# Patient Record
Sex: Female | Born: 1950 | Race: White | Hispanic: No | Marital: Married | Smoking: Former smoker
Health system: Southern US, Community
[De-identification: ages and names within clinical notes are randomized; demographics above are authoritative.]

## PROBLEM LIST (undated history)

## (undated) DIAGNOSIS — Z972 Presence of dental prosthetic device (complete) (partial): Secondary | ICD-10-CM

## (undated) DIAGNOSIS — K219 Gastro-esophageal reflux disease without esophagitis: Secondary | ICD-10-CM

## (undated) DIAGNOSIS — M1990 Unspecified osteoarthritis, unspecified site: Secondary | ICD-10-CM

## (undated) DIAGNOSIS — F329 Major depressive disorder, single episode, unspecified: Secondary | ICD-10-CM

## (undated) DIAGNOSIS — F419 Anxiety disorder, unspecified: Secondary | ICD-10-CM

## (undated) DIAGNOSIS — I959 Hypotension, unspecified: Secondary | ICD-10-CM

## (undated) DIAGNOSIS — E860 Dehydration: Secondary | ICD-10-CM

## (undated) DIAGNOSIS — R6510 Systemic inflammatory response syndrome (SIRS) of non-infectious origin without acute organ dysfunction: Secondary | ICD-10-CM

## (undated) DIAGNOSIS — R7303 Prediabetes: Secondary | ICD-10-CM

## (undated) DIAGNOSIS — I10 Essential (primary) hypertension: Secondary | ICD-10-CM

## (undated) DIAGNOSIS — F32A Depression, unspecified: Secondary | ICD-10-CM

## (undated) HISTORY — PX: ABDOMINAL HYSTERECTOMY: SHX81

## (undated) HISTORY — PX: COLONOSCOPY: SHX174

---

## 2004-03-08 ENCOUNTER — Ambulatory Visit: Payer: Self-pay | Admitting: Unknown Physician Specialty

## 2004-11-22 ENCOUNTER — Ambulatory Visit: Payer: Self-pay | Admitting: Internal Medicine

## 2005-11-23 ENCOUNTER — Ambulatory Visit: Payer: Self-pay | Admitting: Internal Medicine

## 2006-07-31 ENCOUNTER — Ambulatory Visit: Payer: Self-pay | Admitting: Physician Assistant

## 2006-12-11 ENCOUNTER — Ambulatory Visit (HOSPITAL_COMMUNITY): Admission: RE | Admit: 2006-12-11 | Discharge: 2006-12-12 | Payer: Self-pay | Admitting: Neurosurgery

## 2007-03-01 ENCOUNTER — Ambulatory Visit: Payer: Self-pay | Admitting: Internal Medicine

## 2007-04-19 HISTORY — PX: CERVICAL SPINE SURGERY: SHX589

## 2007-04-19 HISTORY — PX: BACK SURGERY: SHX140

## 2007-04-19 HISTORY — PX: NECK SURGERY: SHX720

## 2007-05-14 ENCOUNTER — Ambulatory Visit: Payer: Self-pay | Admitting: Neurosurgery

## 2007-06-15 ENCOUNTER — Ambulatory Visit (HOSPITAL_COMMUNITY): Admission: RE | Admit: 2007-06-15 | Discharge: 2007-06-16 | Payer: Self-pay | Admitting: Neurosurgery

## 2007-11-26 ENCOUNTER — Other Ambulatory Visit: Payer: Self-pay

## 2007-11-26 ENCOUNTER — Inpatient Hospital Stay: Payer: Self-pay | Admitting: Unknown Physician Specialty

## 2007-12-07 ENCOUNTER — Ambulatory Visit: Payer: Self-pay | Admitting: Unknown Physician Specialty

## 2007-12-18 ENCOUNTER — Ambulatory Visit: Payer: Self-pay | Admitting: Unknown Physician Specialty

## 2008-01-17 ENCOUNTER — Ambulatory Visit: Payer: Self-pay | Admitting: Unknown Physician Specialty

## 2008-02-17 ENCOUNTER — Ambulatory Visit: Payer: Self-pay | Admitting: Unknown Physician Specialty

## 2008-03-18 ENCOUNTER — Ambulatory Visit: Payer: Self-pay | Admitting: Unknown Physician Specialty

## 2008-04-18 ENCOUNTER — Ambulatory Visit: Payer: Self-pay | Admitting: Unknown Physician Specialty

## 2008-05-19 ENCOUNTER — Ambulatory Visit: Payer: Self-pay | Admitting: Unknown Physician Specialty

## 2008-06-24 ENCOUNTER — Ambulatory Visit: Payer: Self-pay | Admitting: Unknown Physician Specialty

## 2008-07-09 ENCOUNTER — Ambulatory Visit: Payer: Self-pay | Admitting: Internal Medicine

## 2008-07-17 ENCOUNTER — Ambulatory Visit: Payer: Self-pay | Admitting: Unknown Physician Specialty

## 2008-08-16 ENCOUNTER — Ambulatory Visit: Payer: Self-pay | Admitting: Unknown Physician Specialty

## 2008-10-16 ENCOUNTER — Ambulatory Visit: Payer: Self-pay | Admitting: Unknown Physician Specialty

## 2008-12-17 ENCOUNTER — Ambulatory Visit: Payer: Self-pay | Admitting: Unknown Physician Specialty

## 2009-01-16 ENCOUNTER — Ambulatory Visit: Payer: Self-pay | Admitting: Unknown Physician Specialty

## 2009-02-16 ENCOUNTER — Ambulatory Visit: Payer: Self-pay | Admitting: Unknown Physician Specialty

## 2009-03-18 ENCOUNTER — Ambulatory Visit: Payer: Self-pay | Admitting: Unknown Physician Specialty

## 2009-04-20 ENCOUNTER — Ambulatory Visit: Payer: Self-pay | Admitting: Unknown Physician Specialty

## 2009-05-19 ENCOUNTER — Ambulatory Visit: Payer: Self-pay | Admitting: Unknown Physician Specialty

## 2009-06-16 ENCOUNTER — Ambulatory Visit: Payer: Self-pay | Admitting: Unknown Physician Specialty

## 2009-07-17 ENCOUNTER — Ambulatory Visit: Payer: Self-pay | Admitting: Unknown Physician Specialty

## 2009-08-18 ENCOUNTER — Ambulatory Visit: Payer: Self-pay | Admitting: Unknown Physician Specialty

## 2009-09-16 ENCOUNTER — Ambulatory Visit: Payer: Self-pay | Admitting: Unknown Physician Specialty

## 2009-09-29 ENCOUNTER — Ambulatory Visit: Payer: Self-pay | Admitting: Internal Medicine

## 2009-10-16 ENCOUNTER — Ambulatory Visit: Payer: Self-pay | Admitting: Unknown Physician Specialty

## 2009-11-16 ENCOUNTER — Ambulatory Visit: Payer: Self-pay | Admitting: Unknown Physician Specialty

## 2009-12-17 ENCOUNTER — Ambulatory Visit: Payer: Self-pay | Admitting: Unknown Physician Specialty

## 2010-01-18 ENCOUNTER — Ambulatory Visit: Payer: Self-pay | Admitting: Unknown Physician Specialty

## 2010-02-16 ENCOUNTER — Ambulatory Visit: Payer: Self-pay | Admitting: Unknown Physician Specialty

## 2010-03-18 ENCOUNTER — Ambulatory Visit: Payer: Self-pay | Admitting: Unknown Physician Specialty

## 2010-04-19 ENCOUNTER — Ambulatory Visit: Payer: Self-pay | Admitting: Unknown Physician Specialty

## 2010-05-19 ENCOUNTER — Ambulatory Visit: Payer: Self-pay | Admitting: Unknown Physician Specialty

## 2010-06-17 ENCOUNTER — Ambulatory Visit: Payer: Self-pay | Admitting: Unknown Physician Specialty

## 2010-08-13 ENCOUNTER — Ambulatory Visit: Payer: Self-pay | Admitting: Obstetrics and Gynecology

## 2010-08-17 ENCOUNTER — Ambulatory Visit: Payer: Self-pay | Admitting: Unknown Physician Specialty

## 2010-08-31 NOTE — Op Note (Signed)
[name], [name]              ACCOUNT NO.:  [id number]   MEDICAL RECORD NO.:  [id number]          PATIENT TYPE:  INP   LOCATION:  2899                         FACILITY:  [facility]   PHYSICIAN:  Donalee Citrin, M.D.        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   SURGEON:  Donalee Citrin, M.D.   ASSISTANT:  Reinaldo Meeker, M.D.   ANESTHESIA:  General endotracheal.   The patient is 60 year old female who has longstanding progressively  worsening neck pain, bilateral shoulder and arm pain with numbness and  tingling of the fingers and weakness in the triceps.  MRI scan showed  large amount of spinal stenosis due to spondylosis and ruptured disk at  C4-5, C5-6, C6-7 causing frank spinal cord compression.  Due to the  patient's clinical exam, MRI findings, and failure of conservative  treatment, the patient is recommended anterior cervical diskectomy and  fusion.  Risks and benefits of the operation explained to the patient.  She understood and agreed to proceed.   OPERATIVE REPORT:  The patient was brought to the OR, was induced under  general anesthesia, positioned supine, neck slight extension 5 pounds of  halter traction.  The right side of the neck was prepped and draped in  usual sterile fashion.  Preoperative x-ray localized the C5-6 disk  space.  A curvilinear incision was made just off the midline to the  anterior portion of the  sternocleidomastoid, the superficial platysma  was then dissected out and divided longitudinally.  The avascular plane  between the  sternocleidomastoid and strap muscles was developed down  through the fascia.  Prevertebral fascia was then dissected with  Kittner's.  Intraoperative x-ray confirmed visualization of the C4-5  disk space.  Annulotomy was made with a 15 blade with a scalpel to mark  the disk space and then the longus colli was reflected laterally at all  three levels.  Self-retaining retractors were then placed, exposing C5-6  and C6-7 initially.  There was a marked hyperlordosis of these levels,  a large amount of anterior osteophytes, from the C5 vertebral body to  the C5-6 disk space.  These were bitten away with a 2 and 3 Kerrison  punch as well as the C6-7.  Both disk spaces were drilled down the  posterior annulus and posterior osteophytic complexes.  Then, using the  Kerrison punch first working at C6-7, the undersurface of posterior  annulus, posterior longitudinal ligament removed in piecemeal fashion.  There was a very large osteophyte coming off the C6 vertebral body  displacing the thecal sac spinal cord just to the right of midline.  This was all under bitten very carefully and  aggressively removed, and  this completely decompressed the central canal.  Both endplates were  under bitten, marching across laterally to the level of the C7 pedicle,  and both C7 nerve roots were palpated and decompressed out the foramen  flush to the pedicle.  Then, the Gelfoam was placed after the endplates  were scraped and squared off.  Then, working at C5-6, interspace was  drilled down the posterior annulus, posterior osteophytic complex.  Again very large osteophytes coming off the C5 vertebral body  predominantly but also C6, displacing thecal sac just right of midline.  These were all removed, decompressing the central canal.  Both C6  pedicles were identified.  Both C6 nerve root and neural foramina were  decompressed flush to the pedicle.  Then, both interspaces were  copiously irrigated, and 6-mm  allograft was inserted at C5-6 and 7 mm at  C6-7.  Self-retaining retractor was then repositioned exposing C4-5.  There are large anterior calcified disk coming off the anterior margin  of C4-5.  This was all removed with Leksell rongeur.  Annulotomy was  extended.  Disk space was drilled down.  Very large osteophyte, C4  vertebral body, was under bitten with a 1-mm Kerrison punch  decompressing the central canal.  The posterior longitudinal ligament  was removed in piecemeal fashion, decompressing the central canal as  well.  Aggressive under biting of both the C4-C5 vertebral bodies, flush  with the C4 pedicle and both C5 neural foramina were widely patent at  the end of the foraminotomy and diskectomy.  Then, a 5-mm allograft was  inserted.  A 57-mm Venture plate was then sized and inserted, confirmed  by fluoroscopy, and all eight screws had excellent purchase.  Locking  mechanism engaged.  Wound was then copiously irrigated.  Meticulous  hemostasis was maintained.  Drain was placed, and the wound was closed  in layers with interrupted Vicryl in the platysma and a running 4-0  subcuticular.  Benzoin and Steri-Strips applied. The patient went to the  recovery room in stable condition.  At the end of the case, instrument  and sponge counts.           ______________________________  Donalee Citrin, M.D.     GC/MEDQ  D:  06/15/2007  T:  06/16/2007  Job:  [id number]

## 2010-08-31 NOTE — Op Note (Signed)
NAMEGLENDY, BARSANTI              ACCOUNT NO.:  [id number]   MEDICAL RECORD NO.:  [id number]          PATIENT TYPE:  OIB   LOCATION:  3172                         FACILITY:  [facility]   PHYSICIAN:  Donalee Citrin, M.D.        DATE OF BIRTH:  [birth year]   DATE OF PROCEDURE:  12/11/2006  DATE OF DISCHARGE:                               OPERATIVE REPORT   PREOPERATIVE DIAGNOSIS:  Left sided L5 radiculopathy from ruptured disk  to L4-5 left.   PROCEDURE:  Lumbar laminectomy microdiskectomy at L4-5 left with  microscopic dissection of left L5 nerve root and microscopic diskectomy.   SURGEON:  Donalee Citrin, M.D.   ASSISTANT:  Kathaleen Maser. Pool, M.D.   ANESTHESIA:  General endotracheal.   HISTORY OF PRESENT ILLNESS:  The patient is a very pleasant 60 year old  female who has had back and left leg pain radiating down the posterior  aspect of her left thigh, left calf, across the top of the foot and big  toe and it has been refractory to all forms of conservative treatment  for the last several weeks and months.  The patient's MRI showed large  ruptured disk at L4-5 compressing the left L5 nerve root.  Due to the  patient's failure of conservative treatment and physical exam, I  recommended laminectomy and microdiskectomy.  Risks and benefits of the  operation were explained to the patient.  The patient understood and  agreed to proceed forward.   The patient was brought to the OR and was induced under general  anesthesia.  She was placed prone in the [place].  The back is prepped  and draped in the usual fashion.  After I localized the L4-5 disk space,  a midline incision was made after infiltration of 10 mL of lidocaine  with epinephrine and Bovie electrocautery was used to take down the  subcutaneous tissues.  Subperiosteal dissection was carried out and  lamina was __________  L4-5.  However, intraoperative x-ray confirmed  the position to be to the 3-4 level, so attention was taken 1 end space  below this and in the inferior aspect, the __________  medial facet  complex __________  was drilled down and removed in piecemeal fashion  with the 2 and 3 mm Kerrison punch.  At this point, the ligamentum  flavum was visualized.  This was also removed in piecemeal fashion  exposing the thecal sac.  At this point, the operating room microscope  was draped and brought into the field for microscopic illumination.  The  L5 pedicle was identified.  The remainder of the lateral gutter and  medial facet complex was underbitten with a 2 mm Kerrison punch exposing  the lateral aspect of the L5 nerve root.  This is a remarkably displaced  by a large disk herniation still contained with the ligament.  So a 4  __________  was used to dissect the fiber off of the disk herniation.  It was reflected medial with the D'Errico nerve root retractor and then  annulotomy using the 11 blade scalpel.  After the annulotomy, several  fragments of  disk were immediately expressed and removed.  Then the disk  space was cleaned out radically with __________  and Epstein curettes.  At the end of the diskectomy, there was no further stenosis and the  foramen was explored with a hockey stick, coronary dilator as well as  the medial aspect and lateral aspect of the interspace.  Once adequate  decompression had been confirmed, the wound was copiously irrigated and  meticulous hemostasis was maintained.  Gelfoam was overlaid on top of the dura.  Muscle and fascia were  approximated in layers with interrupted Vicryl and the skin was closed  with a running 4-0 subcuticular and benzoin and Steri-Strips applied.  The patient went to the recovery room in stable condition.  At the end  of the case, needle counts and sponge counts were correct.           ______________________________  Donalee Citrin, M.D.     GC/MEDQ  D:  12/11/2006  T:  12/11/2006  Job:  [id number]

## 2010-09-22 ENCOUNTER — Ambulatory Visit: Payer: Self-pay | Admitting: Obstetrics and Gynecology

## 2010-09-30 ENCOUNTER — Ambulatory Visit: Payer: Self-pay | Admitting: Obstetrics and Gynecology

## 2010-11-04 ENCOUNTER — Ambulatory Visit: Payer: Self-pay | Admitting: Internal Medicine

## 2010-11-09 ENCOUNTER — Ambulatory Visit: Payer: Self-pay | Admitting: Unknown Physician Specialty

## 2010-11-16 ENCOUNTER — Other Ambulatory Visit: Payer: Self-pay | Admitting: Unknown Physician Specialty

## 2010-11-17 ENCOUNTER — Ambulatory Visit: Payer: Self-pay | Admitting: Unknown Physician Specialty

## 2010-11-22 ENCOUNTER — Ambulatory Visit: Payer: Self-pay | Admitting: Obstetrics and Gynecology

## 2010-12-18 ENCOUNTER — Ambulatory Visit: Payer: Self-pay | Admitting: Unknown Physician Specialty

## 2010-12-27 ENCOUNTER — Ambulatory Visit: Payer: Self-pay | Admitting: Unknown Physician Specialty

## 2011-01-07 LAB — BASIC METABOLIC PANEL
BUN: 17
CO2: 30
Calcium: 9.5
Chloride: 103
Creatinine, Ser: 0.8
GFR calc Af Amer: >60

## 2011-01-07 LAB — CBC
MCHC: 34.2
MCV: 90.4
Platelets: 313
RBC: 3.96
WBC: 7.7

## 2011-01-28 LAB — BASIC METABOLIC PANEL
CO2: 29
Calcium: 10
Creatinine, Ser: 0.78
GFR calc Af Amer: >60
Sodium: 138

## 2011-01-28 LAB — CBC
Hemoglobin: 13.6
RBC: 4.4
WBC: 9.4

## 2011-11-07 ENCOUNTER — Ambulatory Visit: Payer: Self-pay | Admitting: Internal Medicine

## 2012-11-07 ENCOUNTER — Ambulatory Visit: Payer: Self-pay | Admitting: Internal Medicine

## 2013-09-25 DIAGNOSIS — I10 Essential (primary) hypertension: Secondary | ICD-10-CM | POA: Insufficient documentation

## 2013-11-11 ENCOUNTER — Ambulatory Visit: Payer: Self-pay | Admitting: Internal Medicine

## 2015-02-23 ENCOUNTER — Other Ambulatory Visit: Payer: Self-pay | Admitting: Internal Medicine

## 2015-02-23 DIAGNOSIS — Z1231 Encounter for screening mammogram for malignant neoplasm of breast: Secondary | ICD-10-CM

## 2015-03-05 ENCOUNTER — Ambulatory Visit
Admission: RE | Admit: 2015-03-05 | Discharge: 2015-03-05 | Disposition: A | Discharge status: Home / Self Care | Payer: Medicare Other | Source: Ambulatory Visit | Attending: Internal Medicine | Admitting: Internal Medicine

## 2015-03-05 ENCOUNTER — Other Ambulatory Visit: Payer: Self-pay | Admitting: Internal Medicine

## 2015-03-05 DIAGNOSIS — Z1231 Encounter for screening mammogram for malignant neoplasm of breast: Secondary | ICD-10-CM

## 2016-02-02 ENCOUNTER — Other Ambulatory Visit: Payer: Self-pay | Admitting: Internal Medicine

## 2016-02-02 DIAGNOSIS — Z1231 Encounter for screening mammogram for malignant neoplasm of breast: Secondary | ICD-10-CM

## 2016-03-07 ENCOUNTER — Ambulatory Visit
Admission: RE | Admit: 2016-03-07 | Discharge: 2016-03-07 | Disposition: A | Discharge status: Home / Self Care | Payer: Medicare Other | Source: Ambulatory Visit | Attending: Internal Medicine | Admitting: Internal Medicine

## 2016-03-07 DIAGNOSIS — Z1231 Encounter for screening mammogram for malignant neoplasm of breast: Secondary | ICD-10-CM | POA: Diagnosis present

## 2016-03-17 NOTE — Discharge Instructions (Signed)
Cataract Surgery, Care After °Refer to this sheet in the next few weeks. These instructions provide you with information about caring for yourself after your procedure. Your health care provider may also give you more specific instructions. Your treatment has been planned according to current medical practices, but problems sometimes occur. Call your health care provider if you have any problems or questions after your procedure. °What can I expect after the procedure? °After the procedure, it is common to have: °· Itching. °· Discomfort. °· Fluid discharge. °· Sensitivity to light and to touch. °· Bruising. °Follow these instructions at home: °Eye Care  °· Check your eye every day for signs of infection. Watch for: °¨ Redness, swelling, or pain. °¨ Fluid, blood, or pus. °¨ Warmth. °¨ Bad smell. °Activity  °· Avoid strenuous activities, such as playing contact sports, for as long as told by your health care provider. °· Do not drive or operate heavy machinery until your health care provider approves. °· Do not bend or lift heavy objects . Bending increases pressure in the eye. You can walk, climb stairs, and do light household chores. °· Ask your health care provider when you can return to work. If you work in a dusty environment, you may be advised to wear protective eyewear for a period of time. °General instructions  °· Take or apply over-the-counter and prescription medicines only as told by your health care provider. This includes eye drops. °· Do not touch or rub your eyes. °· If you were given a protective shield, wear it as told by your health care provider. If you were not given a protective shield, wear sunglasses as told by your health care provider to protect your eyes. °· Keep the area around your eye clean and dry. Avoid swimming or allowing water to hit you directly in the face while showering until told by your health care provider. Keep soap and shampoo out of your eyes. °· Do not put a contact lens  into the affected eye or eyes until your health care provider approves. °· Keep all follow-up visits as told by your health care provider. This is important. °Contact a health care provider if: ° °· You have increased bruising around your eye. °· You have pain that is not helped with medicine. °· You have a fever. °· You have redness, swelling, or pain in your eye. °· You have fluid, blood, or pus coming from your incision. °· Your vision gets worse. °Get help right away if: °· You have sudden vision loss. °This information is not intended to replace advice given to you by your health care provider. Make sure you discuss any questions you have with your health care provider. °Document Released: 10/22/2004 Document Revised: 08/13/2015 Document Reviewed: 02/12/2015 °Elsevier Interactive Patient Education © 2017 [employer] ° ° ° ° °General Anesthesia, Adult, Care After °These instructions provide you with information about caring for yourself after your procedure. Your health care provider may also give you more specific instructions. Your treatment has been planned according to current medical practices, but problems sometimes occur. Call your health care provider if you have any problems or questions after your procedure. °What can I expect after the procedure? °After the procedure, it is common to have: °· Vomiting. °· A sore throat. °· Mental slowness. °It is common to feel: °· Nauseous. °· Cold or shivery. °· Sleepy. °· Tired. °· Sore or achy, even in parts of your body where you did not have surgery. °Follow these instructions at   home: °For at least 24 hours after the procedure:  °· Do not: °¨ Participate in activities where you could fall or become injured. °¨ Drive. °¨ Use heavy machinery. °¨ Drink alcohol. °¨ Take sleeping pills or medicines that cause drowsiness. °¨ Make important decisions or sign legal documents. °¨ Take care of children on your own. °· Rest. °Eating and drinking  °· If you vomit, drink  water, juice, or soup when you can drink without vomiting. °· Drink enough fluid to keep your urine clear or pale yellow. °· Make sure you have little or no nausea before eating solid foods. °· Follow the diet recommended by your health care provider. °General instructions  °· Have a responsible adult stay with you until you are awake and alert. °· Return to your normal activities as told by your health care provider. Ask your health care provider what activities are safe for you. °· Take over-the-counter and prescription medicines only as told by your health care provider. °· If you smoke, do not smoke without supervision. °· Keep all follow-up visits as told by your health care provider. This is important. °Contact a health care provider if: °· You continue to have nausea or vomiting at home, and medicines are not helpful. °· You cannot drink fluids or start eating again. °· You cannot urinate after 8-12 hours. °· You develop a skin rash. °· You have fever. °· You have increasing redness at the site of your procedure. °Get help right away if: °· You have difficulty breathing. °· You have chest pain. °· You have unexpected bleeding. °· You feel that you are having a life-threatening or urgent problem. °This information is not intended to replace advice given to you by your health care provider. Make sure you discuss any questions you have with your health care provider. °Document Released: 07/11/2000 Document Revised: 09/07/2015 Document Reviewed: 03/19/2015 °Elsevier Interactive Patient Education © 2017 [employer] ° °

## 2016-03-21 ENCOUNTER — Ambulatory Visit: Payer: Medicare Other | Admitting: Anesthesiology

## 2016-03-21 ENCOUNTER — Ambulatory Visit
Admission: RE | Admit: 2016-03-21 | Discharge: 2016-03-21 | Disposition: A | Discharge status: Home / Self Care | Payer: Medicare Other | Source: Ambulatory Visit | Attending: Ophthalmology | Admitting: Ophthalmology

## 2016-03-21 ENCOUNTER — Encounter
Admission: RE | Disposition: A | Discharge status: Home / Self Care | Payer: Self-pay | Source: Ambulatory Visit | Attending: Ophthalmology

## 2016-03-21 DIAGNOSIS — I10 Essential (primary) hypertension: Secondary | ICD-10-CM | POA: Diagnosis not present

## 2016-03-21 DIAGNOSIS — H2511 Age-related nuclear cataract, right eye: Secondary | ICD-10-CM | POA: Insufficient documentation

## 2016-03-21 DIAGNOSIS — Z87891 Personal history of nicotine dependence: Secondary | ICD-10-CM | POA: Diagnosis not present

## 2016-03-21 HISTORY — DX: Presence of dental prosthetic device (complete) (partial): Z97.2

## 2016-03-21 HISTORY — DX: Unspecified osteoarthritis, unspecified site: M19.90

## 2016-03-21 HISTORY — DX: Depression, unspecified: F32.A

## 2016-03-21 HISTORY — PX: CATARACT EXTRACTION W/PHACO: SHX586

## 2016-03-21 HISTORY — DX: Major depressive disorder, single episode, unspecified: F32.9

## 2016-03-21 HISTORY — DX: Essential (primary) hypertension: I10

## 2016-03-21 SURGERY — PHACOEMULSIFICATION, CATARACT, WITH IOL INSERTION
Anesthesia: Monitor Anesthesia Care | Site: Eye | Laterality: Right | Wound class: Clean

## 2016-03-21 MED ORDER — ARMC OPHTHALMIC DILATING DROPS
1.0000 "application " | OPHTHALMIC | Status: DC | PRN
  Administered 2016-03-21 (×3): 1 via OPHTHALMIC

## 2016-03-21 MED ORDER — FENTANYL CITRATE (PF) 100 MCG/2ML IJ SOLN
INTRAMUSCULAR | Status: DC | PRN
  Administered 2016-03-21: 50 ug via INTRAVENOUS

## 2016-03-21 MED ORDER — LACTATED RINGERS IV SOLN: INTRAVENOUS | Status: DC

## 2016-03-21 MED ORDER — CEFUROXIME OPHTHALMIC INJECTION 1 MG/0.1 ML
INJECTION | OPHTHALMIC | Status: DC | PRN
  Administered 2016-03-21: 0.1 mL via OPHTHALMIC

## 2016-03-21 MED ORDER — MOXIFLOXACIN HCL 0.5 % OP SOLN
1.0000 [drp] | OPHTHALMIC | Status: DC | PRN
  Administered 2016-03-21 (×3): 1 [drp] via OPHTHALMIC

## 2016-03-21 MED ORDER — EPINEPHRINE PF 1 MG/ML IJ SOLN
INTRAMUSCULAR | Status: DC | PRN
  Administered 2016-03-21: 104 mL via OPHTHALMIC

## 2016-03-21 MED ORDER — ACETAMINOPHEN 160 MG/5ML PO SOLN: 325.0000 mg | ORAL | Status: DC | PRN

## 2016-03-21 MED ORDER — NA HYALUR & NA CHOND-NA HYALUR 0.4-0.35 ML IO KIT
PACK | INTRAOCULAR | Status: DC | PRN
  Administered 2016-03-21: 1 mL via INTRAOCULAR

## 2016-03-21 MED ORDER — MIDAZOLAM HCL 2 MG/2ML IJ SOLN
INTRAMUSCULAR | Status: DC | PRN
  Administered 2016-03-21: 2 mg via INTRAVENOUS

## 2016-03-21 MED ORDER — BRIMONIDINE TARTRATE 0.2 % OP SOLN
OPHTHALMIC | Status: DC | PRN
  Administered 2016-03-21: 1 [drp] via OPHTHALMIC

## 2016-03-21 MED ORDER — TIMOLOL MALEATE 0.5 % OP SOLN
OPHTHALMIC | Status: DC | PRN
Start: 2016-03-21 — End: 2016-03-21
  Administered 2016-03-21: 1 [drp] via OPHTHALMIC

## 2016-03-21 MED ORDER — LIDOCAINE HCL (PF) 4 % IJ SOLN
INTRAOCULAR | Status: DC | PRN
  Administered 2016-03-21: .5 mL via OPHTHALMIC

## 2016-03-21 MED ORDER — ACETAMINOPHEN 325 MG PO TABS: 325.0000 mg | ORAL_TABLET | ORAL | Status: DC | PRN

## 2016-03-21 SURGICAL SUPPLY — 29 items
APPLICATOR COTTON TIP 3IN (MISCELLANEOUS) ×2 IMPLANT
CANNULA ANT/CHMB 27GA (MISCELLANEOUS) ×2 IMPLANT
DISSECTOR HYDRO NUCLEUS 50X22 (MISCELLANEOUS) ×2 IMPLANT
GLOVE BIO SURGEON STRL SZ7 (GLOVE) ×2 IMPLANT
GLOVE SURG LX 6.5 MICRO (GLOVE) ×1
GLOVE SURG LX STRL 6.5 MICRO (GLOVE) ×1 IMPLANT
GOWN STRL REUS W/ TWL LRG LVL3 (GOWN DISPOSABLE) ×2 IMPLANT
GOWN STRL REUS W/TWL LRG LVL3 (GOWN DISPOSABLE) ×2
LENS IOL ACRSF IQ ULTRA 17.5 (Intraocular Lens) ×1 IMPLANT
LENS IOL ACRYSOF IQ 17.5 (Intraocular Lens) ×2 IMPLANT
MARKER SKIN DUAL TIP RULER LAB (MISCELLANEOUS) ×2 IMPLANT
NEEDLE FILTER BLUNT 18X 1/2SAF (NEEDLE) ×1
NEEDLE FILTER BLUNT 18X1 1/2 (NEEDLE) ×1 IMPLANT
PACK CATARACT BRASINGTON (MISCELLANEOUS) ×2 IMPLANT
PACK EYE AFTER SURG (MISCELLANEOUS) ×2 IMPLANT
PACK OPTHALMIC (MISCELLANEOUS) ×2 IMPLANT
RING MALYGIN 7.0 (MISCELLANEOUS) IMPLANT
SOL BAL SALT 15ML (MISCELLANEOUS)
SOLUTION BAL SALT 15ML (MISCELLANEOUS) IMPLANT
SUT ETHILON 10-0 CS-B-6CS-B-6 (SUTURE)
SUT VICRYL  9 0 (SUTURE)
SUT VICRYL 9 0 (SUTURE) IMPLANT
SUTURE EHLN 10-0 CS-B-6CS-B-6 (SUTURE) IMPLANT
SYR 3ML LL SCALE MARK (SYRINGE) ×2 IMPLANT
SYR TB 1ML LUER SLIP (SYRINGE) ×2 IMPLANT
WATER STERILE IRR 250ML POUR (IV SOLUTION) ×2 IMPLANT
WATER STERILE IRR 500ML POUR (IV SOLUTION) IMPLANT
WICK EYE OCUCEL (MISCELLANEOUS) IMPLANT
WIPE NON LINTING 3.25X3.25 (MISCELLANEOUS) ×2 IMPLANT

## 2016-03-21 NOTE — Transfer of Care (Signed)
Immediate Anesthesia Transfer of Care Note  Patient: Carmen [name]  Procedure(s) Performed: Procedure(s) with comments: CATARACT EXTRACTION PHACO AND INTRAOCULAR LENS PLACEMENT (IOC) (Right) - RIGHT  Patient Location: PACU  Anesthesia Type: MAC  Level of Consciousness: awake, alert  and patient cooperative  Airway and Oxygen Therapy: Patient Spontanous Breathing and Patient connected to supplemental oxygen  Post-op Assessment: Post-op Vital signs reviewed, Patient's Cardiovascular Status Stable, Respiratory Function Stable, Patent Airway and No signs of Nausea or vomiting  Post-op Vital Signs: Reviewed and stable  Complications: No apparent anesthesia complications

## 2016-03-21 NOTE — H&P (Signed)
H+P reviewed and is up to date, please see paper chart.  

## 2016-03-21 NOTE — Anesthesia Postprocedure Evaluation (Signed)
Anesthesia Post Note  Patient: Carmen [name]  Procedure(s) Performed: Procedure(s) (LRB): CATARACT EXTRACTION PHACO AND INTRAOCULAR LENS PLACEMENT (IOC) (Right)  Patient location during evaluation: PACU Anesthesia Type: MAC Level of consciousness: awake and alert and oriented Pain management: satisfactory to patient Vital Signs Assessment: post-procedure vital signs reviewed and stable Respiratory status: spontaneous breathing, nonlabored ventilation and respiratory function stable Cardiovascular status: blood pressure returned to baseline and stable Postop Assessment: Adequate PO intake and No signs of nausea or vomiting Anesthetic complications: no    Raliegh Ip

## 2016-03-21 NOTE — Anesthesia Procedure Notes (Signed)
Procedure Name: MAC Performed by: Jailyn Langhorst Pre-anesthesia Checklist: Patient identified, Emergency Drugs available, Suction available, Timeout performed and Patient being monitored Patient Re-evaluated:Patient Re-evaluated prior to inductionOxygen Delivery Method: Nasal cannula Placement Confirmation: positive ETCO2     

## 2016-03-21 NOTE — Op Note (Signed)
Date of Surgery: 03/21/2016  PREOPERATIVE DIAGNOSES: Visually significant nuclear sclerotic cataract, right eye.  POSTOPERATIVE DIAGNOSES: Same  PROCEDURES PERFORMED: Cataract extraction with intraocular lens implant, right eye.  SURGEON: Almon Hercules, M.D.  ANESTHESIA: MAC and topical  IMPLANTS: [id number] +17.5 D  Implant Name Type Inv. Item Serial No. Manufacturer Lot No. LRB No. Used  LENS IOL ACRYSOF IQ 17.5 - [id number] Intraocular Lens LENS IOL ACRYSOF IQ 17.5 [id number] ALCON   Right 1     COMPLICATIONS: None.  DESCRIPTION OF PROCEDURE: Therapeutic options were discussed with the patient preoperatively, including a discussion of risks and benefits of surgery. Informed consent was obtained. An IOL-Master and immersion biometry were used to take the lens measurements, and a dilated fundus exam was performed within 6 months of the surgical date.  The patient was premedicated and brought to the operating room and placed on the operating table in the supine position. After adequate anesthesia, the patient was prepped and draped in the usual sterile ophthalmic fashion. A wire lid speculum was inserted and the microscope was positioned. A Superblade was used to create a paracentesis site at the limbus and a small amount of dilute preservative free lidocaine was instilled into the anterior chamber, followed by dispersive viscoelastic. A clear corneal incision was created temporally using a 2.4 mm keratome blade. Capsulorrhexis was then performed. In situ phacoemulsification was performed.  Cortical material was removed with the irrigation-aspiration unit. Dispersive viscoelastic was instilled to open the capsular bag. A posterior chamber intraocular lens with the specifications above was inserted and positioned. Irrigation-aspiration was used to remove all viscoelastic. Cefuroxime 1cc was instilled into the anterior chamber, and the corneal incision was checked and found to be water tight.  The eyelid speculum was removed.  The operative eye was covered with protective goggles after instilling 1 drop of timolol and brimonidine. The patient tolerated the procedure well. There were no complications.

## 2016-03-21 NOTE — Anesthesia Preprocedure Evaluation (Signed)
Anesthesia Evaluation  Patient identified by MRN, date of birth, ID band Patient awake    Reviewed: Allergy & Precautions, H&P , NPO status , Patient's Chart, lab work & pertinent test results  Airway Mallampati: II  TM Distance: >3 FB Neck ROM: full    Dental no notable dental hx.    Pulmonary former smoker,    Pulmonary exam normal        Cardiovascular hypertension, Normal cardiovascular exam     Neuro/Psych    GI/Hepatic   Endo/Other    Renal/GU      Musculoskeletal   Abdominal   Peds  Hematology   Anesthesia Other Findings   Reproductive/Obstetrics                             Anesthesia Physical Anesthesia Plan  ASA: II  Anesthesia Plan: MAC   Post-op Pain Management:    Induction:   Airway Management Planned:   Additional Equipment:   Intra-op Plan:   Post-operative Plan:   Informed Consent: I have reviewed the patients History and Physical, chart, labs and discussed the procedure including the risks, benefits and alternatives for the proposed anesthesia with the patient or authorized representative who has indicated his/her understanding and acceptance.     Plan Discussed with:   Anesthesia Plan Comments:         Anesthesia Quick Evaluation  

## 2016-03-22 ENCOUNTER — Encounter: Payer: Self-pay | Admitting: Ophthalmology

## 2016-04-29 ENCOUNTER — Encounter: Payer: Self-pay | Admitting: *Deleted

## 2016-05-06 NOTE — Discharge Instructions (Signed)
Cataract Surgery, Care After °Refer to this sheet in the next few weeks. These instructions provide you with information about caring for yourself after your procedure. Your health care provider may also give you more specific instructions. Your treatment has been planned according to current medical practices, but problems sometimes occur. Call your health care provider if you have any problems or questions after your procedure. °What can I expect after the procedure? °After the procedure, it is common to have: °· Itching. °· Discomfort. °· Fluid discharge. °· Sensitivity to light and to touch. °· Bruising. °Follow these instructions at home: °Eye Care  °· Check your eye every day for signs of infection. Watch for: °¨ Redness, swelling, or pain. °¨ Fluid, blood, or pus. °¨ Warmth. °¨ Bad smell. °Activity  °· Avoid strenuous activities, such as playing contact sports, for as long as told by your health care provider. °· Do not drive or operate heavy machinery until your health care provider approves. °· Do not bend or lift heavy objects . Bending increases pressure in the eye. You can walk, climb stairs, and do light household chores. °· Ask your health care provider when you can return to work. If you work in a dusty environment, you may be advised to wear protective eyewear for a period of time. °General instructions  °· Take or apply over-the-counter and prescription medicines only as told by your health care provider. This includes eye drops. °· Do not touch or rub your eyes. °· If you were given a protective shield, wear it as told by your health care provider. If you were not given a protective shield, wear sunglasses as told by your health care provider to protect your eyes. °· Keep the area around your eye clean and dry. Avoid swimming or allowing water to hit you directly in the face while showering until told by your health care provider. Keep soap and shampoo out of your eyes. °· Do not put a contact lens  into the affected eye or eyes until your health care provider approves. °· Keep all follow-up visits as told by your health care provider. This is important. °Contact a health care provider if: ° °· You have increased bruising around your eye. °· You have pain that is not helped with medicine. °· You have a fever. °· You have redness, swelling, or pain in your eye. °· You have fluid, blood, or pus coming from your incision. °· Your vision gets worse. °Get help right away if: °· You have sudden vision loss. °This information is not intended to replace advice given to you by your health care provider. Make sure you discuss any questions you have with your health care provider. °Document Released: 10/22/2004 Document Revised: 08/13/2015 Document Reviewed: 02/12/2015 °Elsevier Interactive Patient Education © 2017 [employer] ° ° ° ° °General Anesthesia, Adult, Care After °These instructions provide you with information about caring for yourself after your procedure. Your health care provider may also give you more specific instructions. Your treatment has been planned according to current medical practices, but problems sometimes occur. Call your health care provider if you have any problems or questions after your procedure. °What can I expect after the procedure? °After the procedure, it is common to have: °· Vomiting. °· A sore throat. °· Mental slowness. °It is common to feel: °· Nauseous. °· Cold or shivery. °· Sleepy. °· Tired. °· Sore or achy, even in parts of your body where you did not have surgery. °Follow these instructions at   home: °For at least 24 hours after the procedure:  °· Do not: °¨ Participate in activities where you could fall or become injured. °¨ Drive. °¨ Use heavy machinery. °¨ Drink alcohol. °¨ Take sleeping pills or medicines that cause drowsiness. °¨ Make important decisions or sign legal documents. °¨ Take care of children on your own. °· Rest. °Eating and drinking  °· If you vomit, drink  water, juice, or soup when you can drink without vomiting. °· Drink enough fluid to keep your urine clear or pale yellow. °· Make sure you have little or no nausea before eating solid foods. °· Follow the diet recommended by your health care provider. °General instructions  °· Have a responsible adult stay with you until you are awake and alert. °· Return to your normal activities as told by your health care provider. Ask your health care provider what activities are safe for you. °· Take over-the-counter and prescription medicines only as told by your health care provider. °· If you smoke, do not smoke without supervision. °· Keep all follow-up visits as told by your health care provider. This is important. °Contact a health care provider if: °· You continue to have nausea or vomiting at home, and medicines are not helpful. °· You cannot drink fluids or start eating again. °· You cannot urinate after 8-12 hours. °· You develop a skin rash. °· You have fever. °· You have increasing redness at the site of your procedure. °Get help right away if: °· You have difficulty breathing. °· You have chest pain. °· You have unexpected bleeding. °· You feel that you are having a life-threatening or urgent problem. °This information is not intended to replace advice given to you by your health care provider. Make sure you discuss any questions you have with your health care provider. °Document Released: 07/11/2000 Document Revised: 09/07/2015 Document Reviewed: 03/19/2015 °Elsevier Interactive Patient Education © 2017 [employer] ° °

## 2016-05-09 ENCOUNTER — Ambulatory Visit: Payer: Medicare Other | Admitting: Anesthesiology

## 2016-05-09 ENCOUNTER — Ambulatory Visit
Admission: RE | Admit: 2016-05-09 | Discharge: 2016-05-09 | Disposition: A | Discharge status: Home / Self Care | Payer: Medicare Other | Source: Ambulatory Visit | Attending: Ophthalmology | Admitting: Ophthalmology

## 2016-05-09 ENCOUNTER — Encounter
Admission: RE | Disposition: A | Discharge status: Home / Self Care | Payer: Self-pay | Source: Ambulatory Visit | Attending: Ophthalmology

## 2016-05-09 ENCOUNTER — Encounter: Payer: Self-pay | Admitting: *Deleted

## 2016-05-09 DIAGNOSIS — Z90710 Acquired absence of both cervix and uterus: Secondary | ICD-10-CM | POA: Diagnosis not present

## 2016-05-09 DIAGNOSIS — Z87891 Personal history of nicotine dependence: Secondary | ICD-10-CM | POA: Insufficient documentation

## 2016-05-09 DIAGNOSIS — M1990 Unspecified osteoarthritis, unspecified site: Secondary | ICD-10-CM | POA: Diagnosis not present

## 2016-05-09 DIAGNOSIS — Z9849 Cataract extraction status, unspecified eye: Secondary | ICD-10-CM | POA: Insufficient documentation

## 2016-05-09 DIAGNOSIS — F329 Major depressive disorder, single episode, unspecified: Secondary | ICD-10-CM | POA: Insufficient documentation

## 2016-05-09 DIAGNOSIS — H2512 Age-related nuclear cataract, left eye: Secondary | ICD-10-CM | POA: Diagnosis not present

## 2016-05-09 DIAGNOSIS — Z888 Allergy status to other drugs, medicaments and biological substances status: Secondary | ICD-10-CM | POA: Insufficient documentation

## 2016-05-09 DIAGNOSIS — I10 Essential (primary) hypertension: Secondary | ICD-10-CM | POA: Insufficient documentation

## 2016-05-09 HISTORY — PX: CATARACT EXTRACTION W/PHACO: SHX586

## 2016-05-09 SURGERY — PHACOEMULSIFICATION, CATARACT, WITH IOL INSERTION
Anesthesia: Monitor Anesthesia Care | Site: Eye | Laterality: Left | Wound class: Clean

## 2016-05-09 MED ORDER — BRIMONIDINE TARTRATE-TIMOLOL 0.2-0.5 % OP SOLN
OPHTHALMIC | Status: DC | PRN
  Administered 2016-05-09: 1 [drp] via OPHTHALMIC

## 2016-05-09 MED ORDER — FENTANYL CITRATE (PF) 100 MCG/2ML IJ SOLN
INTRAMUSCULAR | Status: DC | PRN
  Administered 2016-05-09: 50 ug via INTRAVENOUS

## 2016-05-09 MED ORDER — ARMC OPHTHALMIC DILATING DROPS
1.0000 "application " | OPHTHALMIC | Status: DC | PRN
  Administered 2016-05-09 (×3): 1 via OPHTHALMIC

## 2016-05-09 MED ORDER — EPINEPHRINE PF 1 MG/ML IJ SOLN
INTRAOCULAR | Status: DC | PRN
  Administered 2016-05-09: 97 mL via OPHTHALMIC

## 2016-05-09 MED ORDER — MOXIFLOXACIN HCL 0.5 % OP SOLN
1.0000 [drp] | OPHTHALMIC | Status: DC | PRN
  Administered 2016-05-09 (×3): 1 [drp] via OPHTHALMIC

## 2016-05-09 MED ORDER — NA HYALUR & NA CHOND-NA HYALUR 0.4-0.35 ML IO KIT
PACK | INTRAOCULAR | Status: DC | PRN
  Administered 2016-05-09: 1 mL via INTRAOCULAR

## 2016-05-09 MED ORDER — CEFUROXIME OPHTHALMIC INJECTION 1 MG/0.1 ML
INJECTION | OPHTHALMIC | Status: DC | PRN
  Administered 2016-05-09: .3 mL via OPHTHALMIC

## 2016-05-09 MED ORDER — BALANCED SALT IO SOLN
INTRAOCULAR | Status: DC | PRN
  Administered 2016-05-09: 2 mL via OPHTHALMIC

## 2016-05-09 MED ORDER — MIDAZOLAM HCL 2 MG/2ML IJ SOLN
INTRAMUSCULAR | Status: DC | PRN
  Administered 2016-05-09: 2 mg via INTRAVENOUS

## 2016-05-09 SURGICAL SUPPLY — 23 items
APPLICATOR COTTON TIP 3IN (MISCELLANEOUS) ×2 IMPLANT
CANNULA ANT/CHMB 27GA (MISCELLANEOUS) ×2 IMPLANT
DISSECTOR HYDRO NUCLEUS 50X22 (MISCELLANEOUS) ×2 IMPLANT
GLOVE BIO SURGEON STRL SZ7 (GLOVE) ×2 IMPLANT
GLOVE SURG LX 6.5 MICRO (GLOVE) ×1
GLOVE SURG LX STRL 6.5 MICRO (GLOVE) ×1 IMPLANT
GOWN STRL REUS W/ TWL LRG LVL3 (GOWN DISPOSABLE) ×2 IMPLANT
GOWN STRL REUS W/TWL LRG LVL3 (GOWN DISPOSABLE) ×2
LENS IOL ACRSF IQ ULTRA 17.0 (Intraocular Lens) ×1 IMPLANT
LENS IOL ACRYSOF IQ 17.0 (Intraocular Lens) ×2 IMPLANT
MARKER SKIN DUAL TIP RULER LAB (MISCELLANEOUS) ×2 IMPLANT
PACK CATARACT BRASINGTON (MISCELLANEOUS) ×2 IMPLANT
PACK EYE AFTER SURG (MISCELLANEOUS) ×2 IMPLANT
PACK OPTHALMIC (MISCELLANEOUS) ×2 IMPLANT
RING MALYGIN 7.0 (MISCELLANEOUS) IMPLANT
SUT ETHILON 10-0 CS-B-6CS-B-6 (SUTURE)
SUT VICRYL  9 0 (SUTURE)
SUT VICRYL 9 0 (SUTURE) IMPLANT
SUTURE EHLN 10-0 CS-B-6CS-B-6 (SUTURE) IMPLANT
SYR TB 1ML LUER SLIP (SYRINGE) ×2 IMPLANT
WATER STERILE IRR 250ML POUR (IV SOLUTION) ×2 IMPLANT
WICK EYE OCUCEL (MISCELLANEOUS) IMPLANT
WIPE NON LINTING 3.25X3.25 (MISCELLANEOUS) ×2 IMPLANT

## 2016-05-09 NOTE — Op Note (Signed)
Date of Surgery: 05/09/2016  PREOPERATIVE DIAGNOSES: Visually significant nuclear sclerotic cataract, left eye.  POSTOPERATIVE DIAGNOSES: Same  PROCEDURES PERFORMED: Cataract extraction with intraocular lens implant, left eye.  SURGEON: Almon Hercules, M.D.  ANESTHESIA: MAC and topical  IMPLANTS: [id number] +17.0 D  Implant Name Type Inv. Item Serial No. Manufacturer Lot No. LRB No. Used  LENS IOL ACRYSOF IQ 17.0 - [id number] Intraocular Lens LENS IOL ACRYSOF IQ 17.0 [id number] ALCON   Left 1    COMPLICATIONS: None.  DESCRIPTION OF PROCEDURE: Therapeutic options were discussed with the patient preoperatively, including a discussion of risks and benefits of surgery. Informed consent was obtained. An IOL-Master and immersion biometry were used to take the lens measurements, and a dilated fundus exam was performed within 6 months of the surgical date.  The patient was premedicated and brought to the operating room and placed on the operating table in the supine position. After adequate anesthesia, the patient was prepped and draped in the usual sterile ophthalmic fashion. A wire lid speculum was inserted and the microscope was positioned. A Superblade was used to create a paracentesis site at the limbus and a small amount of dilute preservative free lidocaine was instilled into the anterior chamber, followed by dispersive viscoelastic. A clear corneal incision was created temporally using a 2.4 mm keratome blade. Capsulorrhexis was then performed. In situ phacoemulsification was performed.  Cortical material was removed with the irrigation-aspiration unit. Dispersive viscoelastic was instilled to open the capsular bag. A posterior chamber intraocular lens with the specifications above was inserted and positioned. Irrigation-aspiration was used to remove all viscoelastic. Cefuroxime 1cc was instilled into the anterior chamber, and the corneal incision was checked and found to be water tight. The  eyelid speculum was removed.  The operative eye was covered with protective goggles after instilling 1 drop of Combigan. The patient tolerated the procedure well. There were no complications.

## 2016-05-09 NOTE — Anesthesia Procedure Notes (Signed)
Procedure Name: MAC Performed by: Mayme Genta Pre-anesthesia Checklist: Patient identified, Emergency Drugs available, Suction available, Timeout performed and Patient being monitored Patient Re-evaluated:Patient Re-evaluated prior to inductionOxygen Delivery Method: Nasal cannula Placement Confirmation: positive ETCO2

## 2016-05-09 NOTE — H&P (Signed)
H+P reviewed and is up to date, please see paper chart.  

## 2016-05-09 NOTE — Anesthesia Preprocedure Evaluation (Signed)
Anesthesia Evaluation  Patient identified by MRN, date of birth, ID band Patient awake    Reviewed: Allergy & Precautions, H&P , NPO status , Patient's Chart, lab work & pertinent test results, reviewed documented beta blocker date and time   Airway Mallampati: II  TM Distance: >3 FB Neck ROM: full    Dental  (+) Partial Upper   Pulmonary former smoker,    Pulmonary exam normal breath sounds clear to auscultation       Cardiovascular Exercise Tolerance: Good hypertension,  Rhythm:regular Rate:Normal     Neuro/Psych PSYCHIATRIC DISORDERS (depression) negative neurological ROS     GI/Hepatic negative GI ROS, Neg liver ROS,   Endo/Other  negative endocrine ROS  Renal/GU negative Renal ROS  negative genitourinary   Musculoskeletal   Abdominal   Peds  Hematology negative hematology ROS (+)   Anesthesia Other Findings   Reproductive/Obstetrics negative OB ROS                             Anesthesia Physical Anesthesia Plan  ASA: II  Anesthesia Plan: MAC   Post-op Pain Management:    Induction:   Airway Management Planned:   Additional Equipment:   Intra-op Plan:   Post-operative Plan:   Informed Consent: I have reviewed the patients History and Physical, chart, labs and discussed the procedure including the risks, benefits and alternatives for the proposed anesthesia with the patient or authorized representative who has indicated his/her understanding and acceptance.   Dental Advisory Given  Plan Discussed with: CRNA  Anesthesia Plan Comments:         Anesthesia Quick Evaluation

## 2016-05-09 NOTE — Transfer of Care (Signed)
Immediate Anesthesia Transfer of Care Note  Patient: Carmen [name]  Procedure(s) Performed: Procedure(s): CATARACT EXTRACTION PHACO AND INTRAOCULAR LENS PLACEMENT (IOC)  Left (Left)  Patient Location: PACU  Anesthesia Type: MAC  Level of Consciousness: awake, alert  and patient cooperative  Airway and Oxygen Therapy: Patient Spontanous Breathing and Patient connected to supplemental oxygen  Post-op Assessment: Post-op Vital signs reviewed, Patient's Cardiovascular Status Stable, Respiratory Function Stable, Patent Airway and No signs of Nausea or vomiting  Post-op Vital Signs: Reviewed and stable  Complications: No apparent anesthesia complications

## 2016-05-09 NOTE — Anesthesia Postprocedure Evaluation (Signed)
Anesthesia Post Note  Patient: Carmen [name]  Procedure(s) Performed: Procedure(s) (LRB): CATARACT EXTRACTION PHACO AND INTRAOCULAR LENS PLACEMENT (IOC)  Left (Left)  Patient location during evaluation: PACU Anesthesia Type: MAC Level of consciousness: awake and alert Pain management: pain level controlled Vital Signs Assessment: post-procedure vital signs reviewed and stable Respiratory status: spontaneous breathing, nonlabored ventilation, respiratory function stable and patient connected to nasal cannula oxygen Cardiovascular status: stable and blood pressure returned to baseline Anesthetic complications: no    Alisa Graff

## 2016-05-10 ENCOUNTER — Encounter: Payer: Self-pay | Admitting: Ophthalmology

## 2017-03-06 ENCOUNTER — Other Ambulatory Visit: Payer: Self-pay | Admitting: Internal Medicine

## 2017-03-06 DIAGNOSIS — Z1231 Encounter for screening mammogram for malignant neoplasm of breast: Secondary | ICD-10-CM

## 2017-04-25 ENCOUNTER — Ambulatory Visit
Admission: RE | Admit: 2017-04-25 | Discharge: 2017-04-25 | Disposition: A | Discharge status: Home / Self Care | Payer: Medicare Other | Source: Ambulatory Visit | Attending: Internal Medicine | Admitting: Internal Medicine

## 2017-04-25 DIAGNOSIS — Z1231 Encounter for screening mammogram for malignant neoplasm of breast: Secondary | ICD-10-CM

## 2018-01-16 ENCOUNTER — Encounter
Admission: RE | Admit: 2018-01-16 | Discharge: 2018-01-16 | Disposition: A | Discharge status: Home / Self Care | Payer: Medicare Other | Source: Ambulatory Visit | Attending: Orthopedic Surgery | Admitting: Orthopedic Surgery

## 2018-01-16 ENCOUNTER — Other Ambulatory Visit: Payer: Self-pay

## 2018-01-16 DIAGNOSIS — M4856XA Collapsed vertebra, not elsewhere classified, lumbar region, initial encounter for fracture: Secondary | ICD-10-CM | POA: Diagnosis present

## 2018-01-16 DIAGNOSIS — E785 Hyperlipidemia, unspecified: Secondary | ICD-10-CM | POA: Diagnosis not present

## 2018-01-16 DIAGNOSIS — Z8262 Family history of osteoporosis: Secondary | ICD-10-CM | POA: Diagnosis not present

## 2018-01-16 DIAGNOSIS — F329 Major depressive disorder, single episode, unspecified: Secondary | ICD-10-CM | POA: Diagnosis not present

## 2018-01-16 DIAGNOSIS — Z01818 Encounter for other preprocedural examination: Secondary | ICD-10-CM

## 2018-01-16 DIAGNOSIS — Z888 Allergy status to other drugs, medicaments and biological substances status: Secondary | ICD-10-CM | POA: Diagnosis not present

## 2018-01-16 DIAGNOSIS — I10 Essential (primary) hypertension: Secondary | ICD-10-CM | POA: Diagnosis not present

## 2018-01-16 DIAGNOSIS — Z833 Family history of diabetes mellitus: Secondary | ICD-10-CM | POA: Diagnosis not present

## 2018-01-16 DIAGNOSIS — M1990 Unspecified osteoarthritis, unspecified site: Secondary | ICD-10-CM | POA: Diagnosis not present

## 2018-01-16 DIAGNOSIS — Z8249 Family history of ischemic heart disease and other diseases of the circulatory system: Secondary | ICD-10-CM | POA: Diagnosis not present

## 2018-01-16 DIAGNOSIS — Z79899 Other long term (current) drug therapy: Secondary | ICD-10-CM | POA: Diagnosis not present

## 2018-01-16 DIAGNOSIS — Z87891 Personal history of nicotine dependence: Secondary | ICD-10-CM | POA: Diagnosis not present

## 2018-01-16 NOTE — Patient Instructions (Addendum)
Your procedure is scheduled on: Thursday, October 3rd  Report to [place]    DO NOT STOP ON THE FIRST FLOOR TO REGISTER  To find out your arrival time please call [phone] between 1PM - 3PM on Wednesday, October 2ND  Remember: Instructions that are not followed completely may result in serious medical risk,  up to and including death, or upon the discretion of your surgeon and anesthesiologist your  surgery may need to be rescheduled.     _X__ 1. Do not eat food after midnight the night before your procedure.                 No gum chewing or hard candies.                     ABSOLUTELY NOTHING SOLID IN YOUR  MOUTH AFTER MIDNIGHT                  You may drink clear liquids up to 2 hours                 before you are scheduled to arrive for your surgery-                    DO not drink clear liquids within 2 hours of the start of your surgery.                 Clear Liquids include:  water, apple juice without pulp, clear carbohydrate                 drink such as Clearfast of Gatorade, Black Coffee or Tea (Do not add                 anything to coffee or tea).  __X__2.  On the morning of surgery brush your teeth with toothpaste and water,                   You may rinse your mouth with mouthwash if you wish.                       Do not swallow any toothpaste of mouthwash.     _X__ 3.  No Alcohol for 24 hours before or after surgery.   _X__ 4.  Do Not Smoke or use e-cigarettes For 24 Hours Prior to Your Surgery.                 Do not use any chewable tobacco products for at least 6 hours prior to                 surgery.  ____  5.  Bring all medications with you on the day of surgery if instructed.   ____  6.  Notify your doctor if there is any change in your medical condition      (cold, fever, infections).     Do not wear jewelry, make-up, hairpins, clips or nail polish. Do not wear lotions, powders, or perfumes. You  may wear deodorant. Do not shave 48 hours prior to surgery. Men may shave face and neck. Do not bring valuables to the hospital.    [facility] is not responsible for any belongings or valuables.  Contacts, dentures or bridgework may not be worn into surgery. Leave your suitcase in the car. After surgery it may be brought to your room. For patients admitted  to the hospital, discharge time is determined by your treatment team.   Patients discharged the day of surgery will not be allowed to drive home.   Please read over the following fact sheets that you were given:   PREPARING FOR SURGERY   _X___ Take these medicines the morning of surgery with A SIP OF WATER:    1. KLONIPIN  2. METOPROLOL  3. ZYPREXA  4. ZOLOFT   5. VERAPAMIL  6.  ____ Fleet Enema (as directed)   __X__ Use CHG Soap as directed  ____ Use inhalers on the day of surgery  _X___ Stop Coumadin/Plavix/aspirin AS OF NOW   _X__ Stop Anti-inflammatories AS OF TODAY   ____ Stop supplements until after surgery.    ____ Bring C-Pap to the hospital.   DO NOT TAKE HYZAAR (LOSARTAN/HYDROCHLOROTHYAZIDE) ON THE MORNING OF SURGERY     BUT DO TAKE IT EVERY DAY UP UNTIL THEN  DO NOT TAKE POTASSIUM ON THE MORNING OF SURGERY, BUT DO CONTINUE     TAKING THEM AS USUAL UP UNTIL THEN.  WEAR LOOSE FITTING CLOTHING TO THE HOSPITAL.

## 2018-01-17 MED ORDER — CEFAZOLIN SODIUM-DEXTROSE 2-4 GM/100ML-% IV SOLN
2.0000 g | Freq: Once | INTRAVENOUS | Status: AC
  Administered 2018-01-18: 2 g via INTRAVENOUS

## 2018-01-18 ENCOUNTER — Ambulatory Visit: Payer: Medicare Other | Admitting: Certified Registered"

## 2018-01-18 ENCOUNTER — Ambulatory Visit
Admission: RE | Admit: 2018-01-18 | Discharge: 2018-01-18 | Disposition: A | Discharge status: Home / Self Care | Payer: Medicare Other | Source: Ambulatory Visit | Attending: Orthopedic Surgery | Admitting: Orthopedic Surgery

## 2018-01-18 ENCOUNTER — Encounter
Admission: RE | Disposition: A | Discharge status: Home / Self Care | Payer: Self-pay | Source: Ambulatory Visit | Attending: Orthopedic Surgery

## 2018-01-18 ENCOUNTER — Other Ambulatory Visit: Payer: Self-pay

## 2018-01-18 ENCOUNTER — Ambulatory Visit: Payer: Medicare Other

## 2018-01-18 DIAGNOSIS — Z833 Family history of diabetes mellitus: Secondary | ICD-10-CM | POA: Insufficient documentation

## 2018-01-18 DIAGNOSIS — M4856XA Collapsed vertebra, not elsewhere classified, lumbar region, initial encounter for fracture: Secondary | ICD-10-CM | POA: Insufficient documentation

## 2018-01-18 DIAGNOSIS — Z419 Encounter for procedure for purposes other than remedying health state, unspecified: Secondary | ICD-10-CM

## 2018-01-18 DIAGNOSIS — E785 Hyperlipidemia, unspecified: Secondary | ICD-10-CM | POA: Insufficient documentation

## 2018-01-18 DIAGNOSIS — Z8262 Family history of osteoporosis: Secondary | ICD-10-CM | POA: Insufficient documentation

## 2018-01-18 DIAGNOSIS — I10 Essential (primary) hypertension: Secondary | ICD-10-CM | POA: Insufficient documentation

## 2018-01-18 DIAGNOSIS — Z87891 Personal history of nicotine dependence: Secondary | ICD-10-CM | POA: Insufficient documentation

## 2018-01-18 DIAGNOSIS — Z79899 Other long term (current) drug therapy: Secondary | ICD-10-CM | POA: Insufficient documentation

## 2018-01-18 DIAGNOSIS — Z888 Allergy status to other drugs, medicaments and biological substances status: Secondary | ICD-10-CM | POA: Insufficient documentation

## 2018-01-18 DIAGNOSIS — F329 Major depressive disorder, single episode, unspecified: Secondary | ICD-10-CM | POA: Insufficient documentation

## 2018-01-18 DIAGNOSIS — M1990 Unspecified osteoarthritis, unspecified site: Secondary | ICD-10-CM | POA: Insufficient documentation

## 2018-01-18 DIAGNOSIS — Z8249 Family history of ischemic heart disease and other diseases of the circulatory system: Secondary | ICD-10-CM | POA: Insufficient documentation

## 2018-01-18 HISTORY — PX: KYPHOPLASTY: SHX5884

## 2018-01-18 SURGERY — KYPHOPLASTY
Anesthesia: General

## 2018-01-18 MED ORDER — MIDAZOLAM HCL 2 MG/2ML IJ SOLN
INTRAMUSCULAR | Status: AC
  Filled 2018-01-18: qty 2

## 2018-01-18 MED ORDER — FENTANYL CITRATE (PF) 100 MCG/2ML IJ SOLN: 25.0000 ug | INTRAMUSCULAR | Status: DC | PRN

## 2018-01-18 MED ORDER — ONDANSETRON HCL 4 MG/2ML IJ SOLN
INTRAMUSCULAR | Status: AC
  Filled 2018-01-18: qty 2

## 2018-01-18 MED ORDER — SODIUM CHLORIDE 0.9 % IV SOLN: INTRAVENOUS | Status: DC

## 2018-01-18 MED ORDER — ONDANSETRON HCL 4 MG/2ML IJ SOLN
INTRAMUSCULAR | Status: DC | PRN
  Administered 2018-01-18: 4 mg via INTRAVENOUS

## 2018-01-18 MED ORDER — LIDOCAINE HCL (PF) 2 % IJ SOLN
INTRAMUSCULAR | Status: AC
  Filled 2018-01-18: qty 10

## 2018-01-18 MED ORDER — MIDAZOLAM HCL 2 MG/2ML IJ SOLN
INTRAMUSCULAR | Status: DC | PRN
  Administered 2018-01-18: 2 mg via INTRAVENOUS

## 2018-01-18 MED ORDER — LIDOCAINE HCL (PF) 1 % IJ SOLN
INTRAMUSCULAR | Status: AC
  Filled 2018-01-18: qty 60

## 2018-01-18 MED ORDER — PROPOFOL 10 MG/ML IV BOLUS
INTRAVENOUS | Status: AC
  Filled 2018-01-18: qty 20

## 2018-01-18 MED ORDER — BUPIVACAINE-EPINEPHRINE (PF) 0.5% -1:200000 IJ SOLN
INTRAMUSCULAR | Status: DC | PRN
  Administered 2018-01-18: 20 mL

## 2018-01-18 MED ORDER — HYDROCODONE-ACETAMINOPHEN 5-325 MG PO TABS: 1.0000 | ORAL_TABLET | ORAL | Status: DC | PRN

## 2018-01-18 MED ORDER — LIDOCAINE HCL (PF) 1 % IJ SOLN
INTRAMUSCULAR | Status: DC | PRN
  Administered 2018-01-18: 10 mL
  Administered 2018-01-18: 20 mL

## 2018-01-18 MED ORDER — CEFAZOLIN SODIUM-DEXTROSE 2-4 GM/100ML-% IV SOLN
INTRAVENOUS | Status: AC
  Filled 2018-01-18: qty 100

## 2018-01-18 MED ORDER — LIDOCAINE HCL (CARDIAC) PF 100 MG/5ML IV SOSY
PREFILLED_SYRINGE | INTRAVENOUS | Status: DC | PRN
  Administered 2018-01-18: 40 mg via INTRAVENOUS

## 2018-01-18 MED ORDER — LACTATED RINGERS IV SOLN
INTRAVENOUS | Status: DC
  Administered 2018-01-18: 11:00:00 via INTRAVENOUS

## 2018-01-18 MED ORDER — IOPAMIDOL (ISOVUE-M 200) INJECTION 41%
INTRAMUSCULAR | Status: AC
  Filled 2018-01-18: qty 20

## 2018-01-18 MED ORDER — METOCLOPRAMIDE HCL 10 MG PO TABS: 5.0000 mg | ORAL_TABLET | Freq: Three times a day (TID) | ORAL | Status: DC | PRN

## 2018-01-18 MED ORDER — ONDANSETRON HCL 4 MG/2ML IJ SOLN: 4.0000 mg | Freq: Four times a day (QID) | INTRAMUSCULAR | Status: DC | PRN

## 2018-01-18 MED ORDER — FENTANYL CITRATE (PF) 100 MCG/2ML IJ SOLN
INTRAMUSCULAR | Status: DC | PRN
  Administered 2018-01-18: 25 ug via INTRAVENOUS
  Administered 2018-01-18: 50 ug via INTRAVENOUS
  Administered 2018-01-18: 25 ug via INTRAVENOUS

## 2018-01-18 MED ORDER — PHENYLEPHRINE HCL 10 MG/ML IJ SOLN
INTRAMUSCULAR | Status: DC | PRN
  Administered 2018-01-18: 100 ug via INTRAVENOUS

## 2018-01-18 MED ORDER — BUPIVACAINE-EPINEPHRINE (PF) 0.5% -1:200000 IJ SOLN
INTRAMUSCULAR | Status: AC
  Filled 2018-01-18: qty 30

## 2018-01-18 MED ORDER — METOCLOPRAMIDE HCL 5 MG/ML IJ SOLN: 5.0000 mg | Freq: Three times a day (TID) | INTRAMUSCULAR | Status: DC | PRN

## 2018-01-18 MED ORDER — PROPOFOL 500 MG/50ML IV EMUL
INTRAVENOUS | Status: DC | PRN
  Administered 2018-01-18: 100 ug/kg/min via INTRAVENOUS

## 2018-01-18 MED ORDER — HYDROCODONE-ACETAMINOPHEN 5-325 MG PO TABS: 1.0000 | ORAL_TABLET | Freq: Four times a day (QID) | ORAL | 0 refills | Status: DC | PRN

## 2018-01-18 MED ORDER — ONDANSETRON HCL 4 MG PO TABS: 4.0000 mg | ORAL_TABLET | Freq: Four times a day (QID) | ORAL | Status: DC | PRN

## 2018-01-18 MED ORDER — FAMOTIDINE 20 MG PO TABS
20.0000 mg | ORAL_TABLET | Freq: Once | ORAL | Status: AC
  Administered 2018-01-18: 20 mg via ORAL

## 2018-01-18 MED ORDER — FAMOTIDINE 20 MG PO TABS
ORAL_TABLET | ORAL | Status: AC
  Administered 2018-01-18: 20 mg via ORAL
  Filled 2018-01-18: qty 1

## 2018-01-18 MED ORDER — FENTANYL CITRATE (PF) 100 MCG/2ML IJ SOLN
INTRAMUSCULAR | Status: AC
  Filled 2018-01-18: qty 2

## 2018-01-18 SURGICAL SUPPLY — 16 items
CEMENT KYPHON CX01A KIT/MIXER (Cement) ×2 IMPLANT
DERMABOND ADVANCED (GAUZE/BANDAGES/DRESSINGS) ×1
DERMABOND ADVANCED .7 DNX12 (GAUZE/BANDAGES/DRESSINGS) ×1 IMPLANT
DEVICE BIOPSY BONE KYPHX (INSTRUMENTS) ×2 IMPLANT
DRAPE C-ARM XRAY 36X54 (DRAPES) ×2 IMPLANT
DURAPREP 26ML APPLICATOR (WOUND CARE) ×2 IMPLANT
GLOVE SURG SYN 9.0  PF PI (GLOVE) ×1
GLOVE SURG SYN 9.0 PF PI (GLOVE) ×1 IMPLANT
GOWN SRG 2XL LVL 4 RGLN SLV (GOWNS) ×1 IMPLANT
GOWN STRL NON-REIN 2XL LVL4 (GOWNS) ×1
GOWN STRL REUS W/ TWL LRG LVL3 (GOWN DISPOSABLE) ×1 IMPLANT
GOWN STRL REUS W/TWL LRG LVL3 (GOWN DISPOSABLE) ×1
PACK KYPHOPLASTY (MISCELLANEOUS) ×2 IMPLANT
STRAP SAFETY 5IN WIDE (MISCELLANEOUS) ×2 IMPLANT
TRAY KYPHOPAK 15/3 EXPRESS 1ST (MISCELLANEOUS) ×1 IMPLANT
TRAY KYPHOPAK 20/3 EXPRESS 1ST (MISCELLANEOUS) ×2 IMPLANT

## 2018-01-18 NOTE — H&P (Signed)
Reviewed paper H+P, will be scanned into chart. No changes noted.  

## 2018-01-18 NOTE — Discharge Instructions (Addendum)
Take it easy today and tomorrow.  Remove Band-Aid on Saturday then okay to shower.  Pain medicine as directed.  AMBULATORY SURGERY  DISCHARGE INSTRUCTIONS   1) The drugs that you were given will stay in your system until tomorrow so for the next 24 hours you should not:  A) Drive an automobile B) Make any legal decisions C) Drink any alcoholic beverage   2) You may resume regular meals tomorrow.  Today it is better to start with liquids and gradually work up to solid foods.  You may eat anything you prefer, but it is better to start with liquids, then soup and crackers, and gradually work up to solid foods.   3) Please notify your doctor immediately if you have any unusual bleeding, trouble breathing, redness and pain at the surgery site, drainage, fever, or pain not relieved by medication.    4) Additional Instructions:        Please contact your physician with any problems or Same Day Surgery at [phone], Monday through Friday 6 am to 4 pm, or [place] at [facility] number at [phone].

## 2018-01-18 NOTE — Op Note (Signed)
01/18/2018  2:33 PM  PATIENT:  Carmen [name]  67 y.o. female  PRE-OPERATIVE DIAGNOSIS:  closed compression fracture of L1 vertebra  POST-OPERATIVE DIAGNOSIS:  closed compression fracture of L1 vertebra  PROCEDURE:  Procedure(s): KYPHOPLASTY-L1 (N/A)  SURGEON: Laurene Footman, MD  ASSISTANTS: None  ANESTHESIA:   local and MAC  EBL:  No intake/output data recorded.  BLOOD ADMINISTERED:none  DRAINS: none   LOCAL MEDICATIONS USED:  MARCAINE    and XYLOCAINE   SPECIMEN:  Source of Specimen:  L1 vertebral body  DISPOSITION OF SPECIMEN:  PATHOLOGY  COUNTS:  YES  TOURNIQUET:  * No tourniquets in log *  IMPLANTS: Bone cement  DICTATION: .Dragon Dictation  patient was brought to the operating room and after adequate sedation was given she was placed in the prone position. C arm was brought in in good visualization of L1  could be made in AP and lateral projections. After patient identification and timeout procedures were completed, a total of 10 cc 1% Xylocaine was infiltrated subcutaneously for initial skin local anesthetic. The back was then prepped and draped in the usual sterile fashion and repeat timeout procedure carried out. A spinal needle was then used to get down on the both sides of the pedicles at L1 and a 50-50 mix of 1% Xylocaine half percent Sensorcaine with epinephrine total of 20 cc infiltrated from the bone to the skin. After this was allowed to set a small incision was made and a trocar advanced in an extrapedicular fashion. Biopsy was obtained and sent for specimen. Drilling was carried out followed by inflation of the balloon to about 2-1/2 to 3 cc which across the midline. Cement was mixed with the balloon inflated to about 2-1/2 cc, approximately 4.5 cc of bone cement was used to fill the vertebral body getting very good fill across the superior endplate left to right side and superior to inferior within the body without extravasation.  When the cement  had set trochar removed and permanent C arm views obtained. The trochar having been removed Dermabond was used to close the skin followed by Band-Aid.  PLAN OF CARE: Discharge to home after PACU  PATIENT DISPOSITION:  PACU - hemodynamically stable.

## 2018-01-18 NOTE — Anesthesia Post-op Follow-up Note (Signed)
Anesthesia QCDR form completed.        

## 2018-01-18 NOTE — Anesthesia Preprocedure Evaluation (Signed)
Anesthesia Evaluation  Patient identified by MRN, date of birth, ID band Patient awake    Reviewed: Allergy & Precautions, H&P , NPO status , Patient's Chart, lab work & pertinent test results, reviewed documented beta blocker date and time   History of Anesthesia Complications Negative for: history of anesthetic complications  Airway Mallampati: II  TM Distance: >3 FB Neck ROM: full    Dental  (+) Partial Upper, Caps, Dental Advidsory Given   Pulmonary neg pulmonary ROS, former smoker,           Cardiovascular Exercise Tolerance: Good hypertension, (-) angina(-) CAD, (-) Past MI, (-) Cardiac Stents and (-) CABG (-) dysrhythmias (-) Valvular Problems/Murmurs     Neuro/Psych PSYCHIATRIC DISORDERS Depression negative neurological ROS     GI/Hepatic negative GI ROS, Neg liver ROS,   Endo/Other  negative endocrine ROS  Renal/GU negative Renal ROS  negative genitourinary   Musculoskeletal   Abdominal   Peds  Hematology negative hematology ROS (+)   Anesthesia Other Findings Past Medical History: No date: Arthritis     Comment:  back No date: Depression No date: Hypertension     Comment:  controlled on meds No date: Wears partial dentures     Comment:  upper   Reproductive/Obstetrics negative OB ROS                             Anesthesia Physical Anesthesia Plan  ASA: II  Anesthesia Plan: General   Post-op Pain Management:    Induction: Intravenous  PONV Risk Score and Plan: 3 and Propofol infusion and TIVA  Airway Management Planned: Natural Airway, Nasal Cannula and Simple Face Mask  Additional Equipment:   Intra-op Plan:   Post-operative Plan:   Informed Consent: I have reviewed the patients History and Physical, chart, labs and discussed the procedure including the risks, benefits and alternatives for the proposed anesthesia with the patient or authorized representative  who has indicated his/her understanding and acceptance.   Dental Advisory Given  Plan Discussed with: Anesthesiologist, CRNA and Surgeon  Anesthesia Plan Comments:         Anesthesia Quick Evaluation

## 2018-01-18 NOTE — Transfer of Care (Signed)
Immediate Anesthesia Transfer of Care Note  Patient: Carmen [name]  Procedure(s) Performed: Anne Ng (N/A )  Patient Location: PACU  Anesthesia Type:General  Level of Consciousness: awake  Airway & Oxygen Therapy: Patient Spontanous Breathing  Post-op Assessment: Report given to RN and Post -op Vital signs reviewed and stable  Post vital signs: Reviewed  Last Vitals:  Vitals Value Taken Time  BP 142/63 01/18/2018  2:41 PM  Temp 36.5 C 01/18/2018  2:40 PM  Pulse 87 01/18/2018  2:41 PM  Resp 19 01/18/2018  2:41 PM  SpO2 95 % 01/18/2018  2:41 PM  Vitals shown include unvalidated device data.  Last Pain:  Vitals:   01/18/18 1031  TempSrc: Tympanic  PainSc: 0-No pain         Complications: No apparent anesthesia complications

## 2018-01-19 ENCOUNTER — Encounter: Payer: Self-pay | Admitting: Orthopedic Surgery

## 2018-01-21 NOTE — Anesthesia Postprocedure Evaluation (Signed)
Anesthesia Post Note  Patient: Marina Boerner  Procedure(s) Performed: Anne Ng (N/A )  Patient location during evaluation: PACU Anesthesia Type: General Level of consciousness: awake and alert Pain management: pain level controlled Vital Signs Assessment: post-procedure vital signs reviewed and stable Respiratory status: spontaneous breathing, nonlabored ventilation, respiratory function stable and patient connected to nasal cannula oxygen Cardiovascular status: blood pressure returned to baseline and stable Postop Assessment: no apparent nausea or vomiting Anesthetic complications: no     Last Vitals:  Vitals:   01/18/18 1521 01/18/18 1530  BP: (!) 151/71 (!) 162/73  Pulse: 68 77  Resp: 14 16  Temp:  36.4 C  SpO2: 92% 97%    Last Pain:  Vitals:   01/19/18 0826  TempSrc:   PainSc: 0-No pain                 Martha Clan

## 2018-01-30 ENCOUNTER — Encounter: Payer: Self-pay | Admitting: Orthopedic Surgery

## 2018-01-30 LAB — SURGICAL PATHOLOGY

## 2018-03-07 IMAGING — MG MM DIGITAL SCREENING BILAT W/ TOMO W/ CAD
8 of 12 series · 8 of 28 positions shown · non-contrast
Comparison: Previous exam(s).

CLINICAL DATA: Screening.

EXAM:
2D DIGITAL SCREENING BILATERAL MAMMOGRAM WITH CAD AND ADJUNCT TOMO

[L MLO]
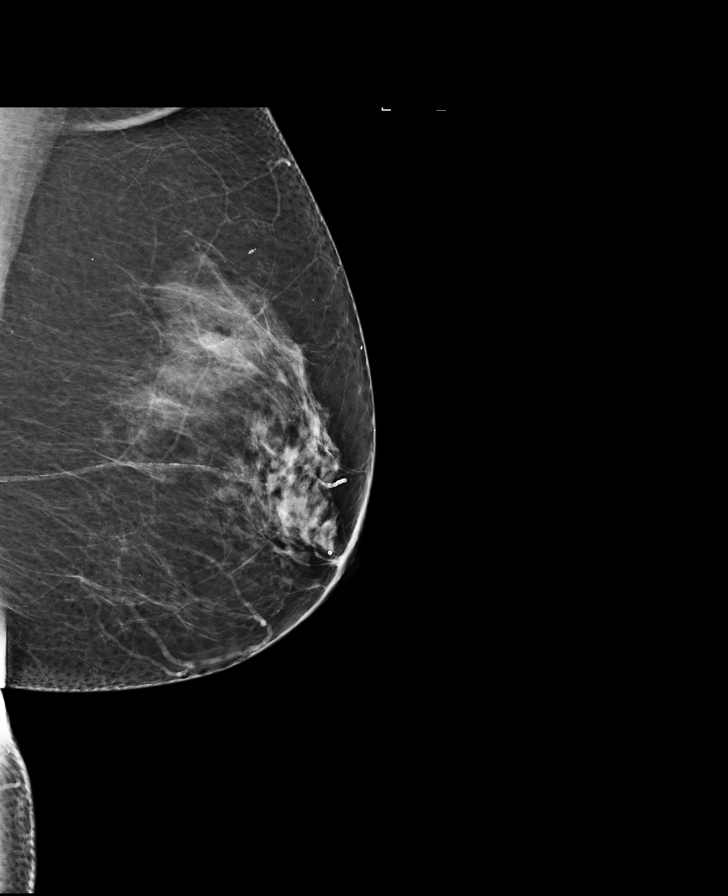

[L MLO synth-2D]
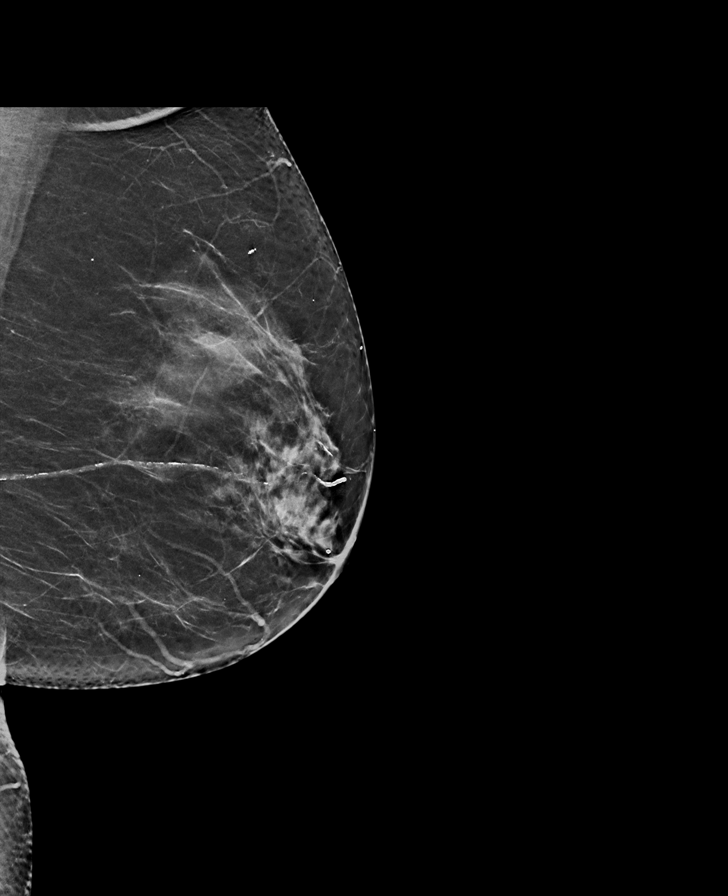

[R MLO synth-2D]
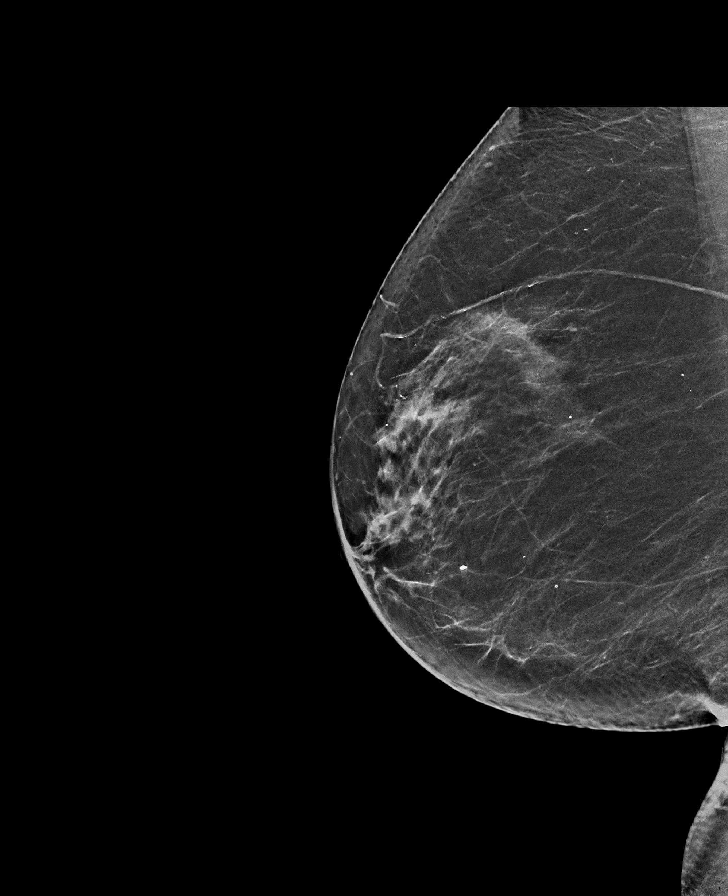

[L CC]
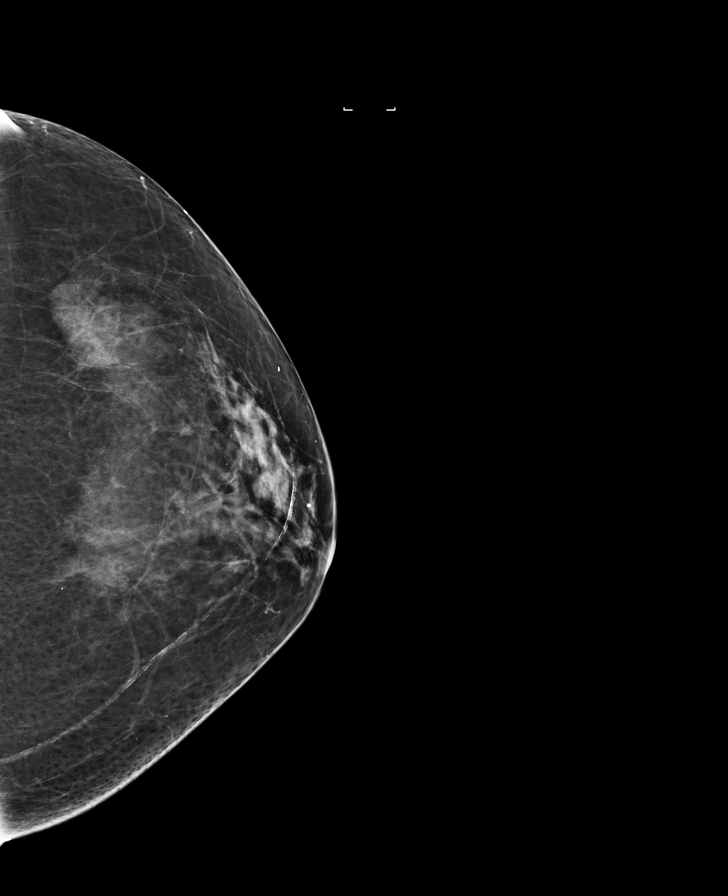

[R CC]
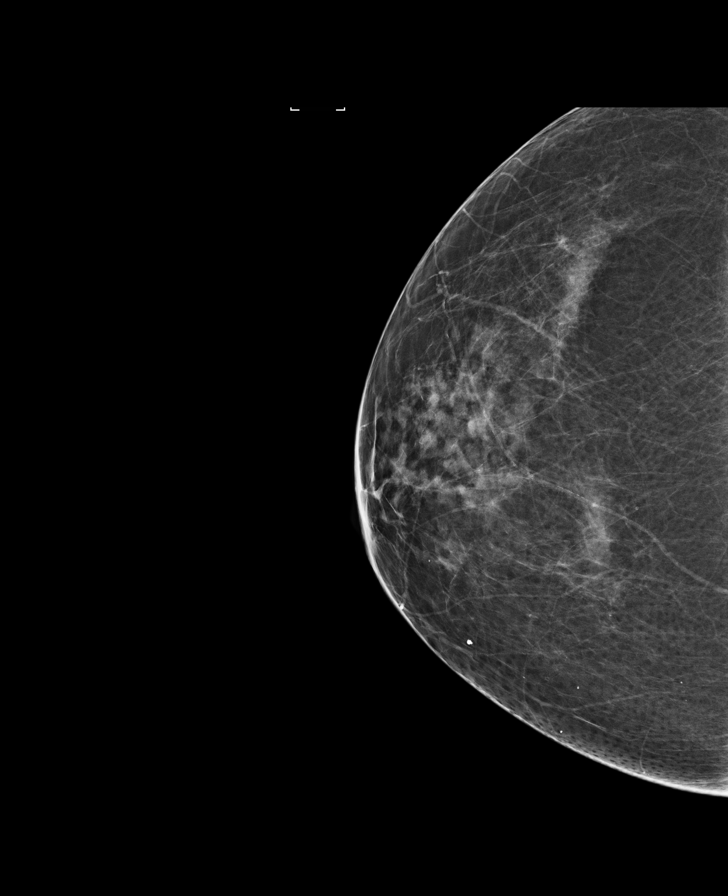

[L CC synth-2D]
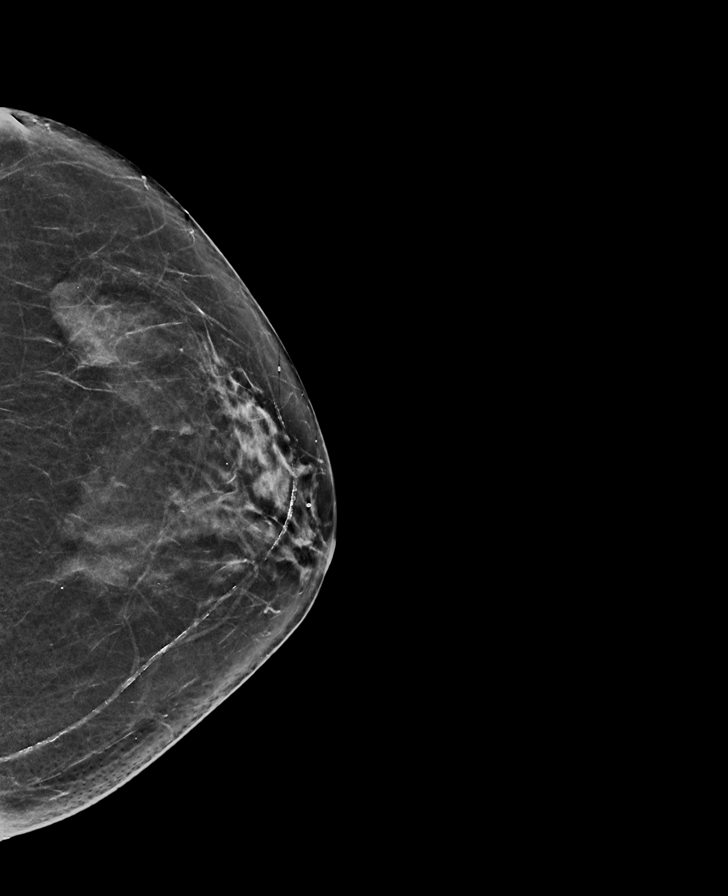

[R MLO]
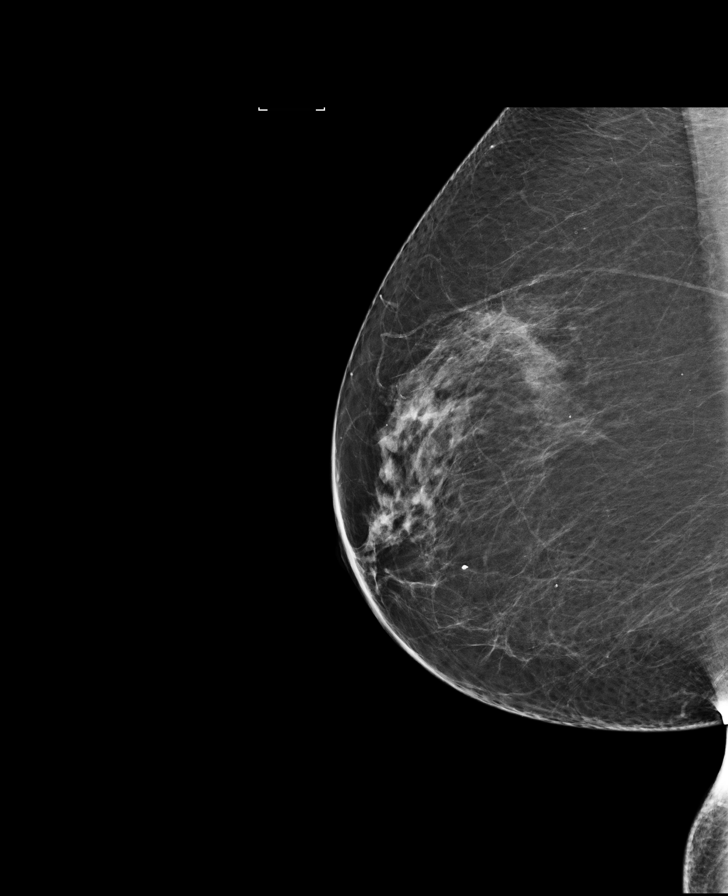

[R CC synth-2D]
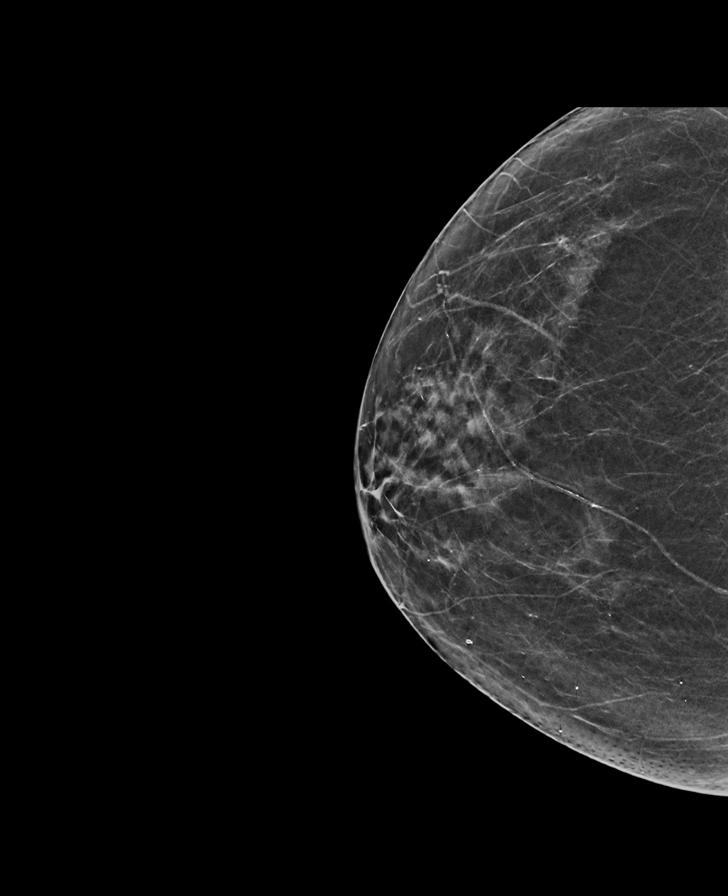

[8 of 28 positions shown; findings below may reference images not displayed]

ACR Breast Density Category b: There are scattered areas of
fibroglandular density.
FINDINGS: There are no findings suspicious for malignancy. Images were
processed with CAD.
IMPRESSION: No mammographic evidence of malignancy. A result letter of this
screening mammogram will be mailed directly to the patient.

RECOMMENDATION:
Screening mammogram in one year. (Code:97-6-RS4)

BI-RADS CATEGORY  1: Negative.

## 2018-03-19 ENCOUNTER — Other Ambulatory Visit: Payer: Self-pay | Admitting: Orthopedic Surgery

## 2018-03-19 DIAGNOSIS — S32030A Wedge compression fracture of third lumbar vertebra, initial encounter for closed fracture: Secondary | ICD-10-CM

## 2018-03-30 ENCOUNTER — Ambulatory Visit
Admission: RE | Admit: 2018-03-30 | Discharge: 2018-03-30 | Disposition: A | Discharge status: Home / Self Care | Payer: Medicare Other | Source: Ambulatory Visit | Attending: Orthopedic Surgery | Admitting: Orthopedic Surgery

## 2018-03-30 DIAGNOSIS — S32030A Wedge compression fracture of third lumbar vertebra, initial encounter for closed fracture: Secondary | ICD-10-CM | POA: Insufficient documentation

## 2018-03-30 DIAGNOSIS — M5126 Other intervertebral disc displacement, lumbar region: Secondary | ICD-10-CM | POA: Diagnosis not present

## 2018-03-30 DIAGNOSIS — X58XXXA Exposure to other specified factors, initial encounter: Secondary | ICD-10-CM | POA: Diagnosis not present

## 2018-03-30 DIAGNOSIS — M48061 Spinal stenosis, lumbar region without neurogenic claudication: Secondary | ICD-10-CM | POA: Diagnosis not present

## 2018-03-30 DIAGNOSIS — Z9889 Other specified postprocedural states: Secondary | ICD-10-CM | POA: Diagnosis not present

## 2018-03-30 DIAGNOSIS — M5136 Other intervertebral disc degeneration, lumbar region: Secondary | ICD-10-CM | POA: Insufficient documentation

## 2018-03-30 DIAGNOSIS — M4805 Spinal stenosis, thoracolumbar region: Secondary | ICD-10-CM | POA: Insufficient documentation

## 2018-04-23 ENCOUNTER — Other Ambulatory Visit: Payer: Self-pay | Admitting: Internal Medicine

## 2018-04-23 DIAGNOSIS — Z1231 Encounter for screening mammogram for malignant neoplasm of breast: Secondary | ICD-10-CM

## 2018-05-09 ENCOUNTER — Ambulatory Visit
Admission: RE | Admit: 2018-05-09 | Discharge: 2018-05-09 | Disposition: A | Discharge status: Home / Self Care | Payer: Medicare Other | Source: Ambulatory Visit | Attending: Internal Medicine | Admitting: Internal Medicine

## 2018-05-09 DIAGNOSIS — Z1231 Encounter for screening mammogram for malignant neoplasm of breast: Secondary | ICD-10-CM | POA: Insufficient documentation

## 2019-03-11 ENCOUNTER — Other Ambulatory Visit: Payer: Self-pay | Admitting: Neurosurgery

## 2019-03-11 NOTE — Addendum Note (Signed)
Addended by: [name] on: 03/11/2019 09:33 PM   Modules accepted: Orders

## 2019-04-16 ENCOUNTER — Encounter
Admission: RE | Admit: 2019-04-16 | Discharge: 2019-04-16 | Disposition: A | Discharge status: Home / Self Care | Payer: Medicare Other | Source: Ambulatory Visit | Attending: Neurosurgery | Admitting: Neurosurgery

## 2019-04-16 ENCOUNTER — Other Ambulatory Visit: Payer: Self-pay

## 2019-04-16 DIAGNOSIS — I10 Essential (primary) hypertension: Secondary | ICD-10-CM

## 2019-04-16 DIAGNOSIS — Z01818 Encounter for other preprocedural examination: Secondary | ICD-10-CM | POA: Diagnosis not present

## 2019-04-16 HISTORY — DX: Anxiety disorder, unspecified: F41.9

## 2019-04-16 LAB — URINALYSIS, ROUTINE W REFLEX MICROSCOPIC
Bacteria, UA: NONE SEEN
Bilirubin Urine: NEGATIVE
Glucose, UA: NEGATIVE mg/dL
Hgb urine dipstick: NEGATIVE
Ketones, ur: NEGATIVE mg/dL
Nitrite: NEGATIVE
Protein, ur: NEGATIVE mg/dL
Specific Gravity, Urine: 1.019 (ref 1.005–1.030)
pH: 5 (ref 5.0–8.0)

## 2019-04-16 LAB — CBC
HCT: 37.4 % (ref 36.0–46.0)
Hemoglobin: 12.8 g/dL (ref 12.0–15.0)
MCH: 30.1 pg (ref 26.0–34.0)
MCHC: 34.2 g/dL (ref 30.0–36.0)
MCV: 88 fL (ref 80.0–100.0)
Platelets: 254 10*3/uL (ref 150–400)
RBC: 4.25 MIL/uL (ref 3.87–5.11)
RDW: 12.5 % (ref 11.5–15.5)
WBC: 7.4 10*3/uL (ref 4.0–10.5)
nRBC: 0 % (ref 0.0–0.2)

## 2019-04-16 LAB — BASIC METABOLIC PANEL
Anion gap: 11 (ref 5–15)
BUN: 18 mg/dL (ref 8–23)
CO2: 26 mmol/L (ref 22–32)
Calcium: 9 mg/dL (ref 8.9–10.3)
Chloride: 102 mmol/L (ref 98–111)
Creatinine, Ser: 0.84 mg/dL (ref 0.44–1.00)
GFR calc Af Amer: >60 mL/min (ref >60)
GFR calc non Af Amer: >60 mL/min (ref >60)
Glucose, Bld: 116 mg/dL — ABNORMAL HIGH (ref 70–99)
Potassium: 3.6 mmol/L (ref 3.5–5.1)
Sodium: 139 mmol/L (ref 135–145)

## 2019-04-16 LAB — SURGICAL PCR SCREEN
MRSA, PCR: NEGATIVE
Staphylococcus aureus: POSITIVE — AB

## 2019-04-16 LAB — APTT: aPTT: <24 seconds — ABNORMAL LOW (ref 24–36)

## 2019-04-16 LAB — PROTIME-INR
INR: 0.9 (ref 0.8–1.2)
Prothrombin Time: 12 seconds (ref 11.4–15.2)

## 2019-04-16 NOTE — Patient Instructions (Signed)
Your procedure is scheduled on: Monday 04/22/19 Report to [place]. To find out your arrival time please call [phone] between 1PM - 3PM on Thursday 04/18/19.  Remember: Instructions that are not followed completely may result in serious medical risk, up to and including death, or upon the discretion of your surgeon and anesthesiologist your surgery may need to be rescheduled.     _X__ 1. Do not eat food after midnight the night before your procedure.                 No gum chewing or hard candies. You may drink clear liquids up to 2 hours                 before you are scheduled to arrive for your surgery- DO not drink clear                 liquids within 2 hours of the start of your surgery.                 Clear Liquids include:  water, apple juice without pulp, clear carbohydrate                 drink such as Clearfast or Gatorade, Black Coffee or Tea (Do not add                 anything to coffee or tea). Diabetics water only  __X__2.  On the morning of surgery brush your teeth with toothpaste and water, you                 may rinse your mouth with mouthwash if you wish.  Do not swallow any              toothpaste of mouthwash.     _X__ 3.  No Alcohol for 24 hours before or after surgery.   _X__ 4.  Do Not Smoke or use e-cigarettes For 24 Hours Prior to Your Surgery.                 Do not use any chewable tobacco products for at least 6 hours prior to                 surgery.  ____  5.  Bring all medications with you on the day of surgery if instructed.   __X__  6.  Notify your doctor if there is any change in your medical condition      (cold, fever, infections).     Do not wear jewelry, make-up, hairpins, clips or nail polish. Do not wear lotions, powders, or perfumes.  Do not shave 48 hours prior to surgery. Men may shave face and neck. Do not bring valuables to the hospital.    [facility] is not responsible for  any belongings or valuables.  Contacts, dentures/partials or body piercings may not be worn into surgery. Bring a case for your contacts, glasses or hearing aids, a denture cup will be supplied. Leave your suitcase in the car. After surgery it may be brought to your room. For patients admitted to the hospital, discharge time is determined by your treatment team.   Patients discharged the day of surgery will not be allowed to drive home.   Please read over the following fact sheets that you were given:   MRSA Information  __X__ Take these medicines the morning of surgery with A SIP OF WATER:  1. Abilify  2. Clonazepam  3. Metoprolol  4. Zyprexa  5. Zoloft  6.  ____ Fleet Enema (as directed)   __X__ Use CHG Soap/SAGE wipes as directed  ____ Use inhalers on the day of surgery  ____ Stop metformin/Janumet/Farxiga 2 days prior to surgery    ____ Take 1/2 of usual insulin dose the night before surgery. No insulin the morning          of surgery.   ____ Stop Blood Thinners Coumadin/Plavix/Xarelto/Pleta/Pradaxa/Eliquis/Effient/Aspirin  on   Or contact your Surgeon, Cardiologist or Medical Doctor regarding  ability to stop your blood thinners  __X__ Stop Anti-inflammatories 7 days before surgery such as Advil, Ibuprofen, Motrin,  BC or Goodies Powder, Naprosyn, Naproxen, Aleve, Aspirin    __X__ Stop all herbal supplements, fish oil or vitamin E until after surgery.    ____ Bring C-Pap to the hospital.

## 2019-04-18 ENCOUNTER — Other Ambulatory Visit: Payer: Self-pay

## 2019-04-18 ENCOUNTER — Other Ambulatory Visit
Admission: RE | Admit: 2019-04-18 | Discharge: 2019-04-18 | Disposition: A | Discharge status: Home / Self Care | Payer: Medicare Other | Source: Ambulatory Visit | Attending: Neurosurgery | Admitting: Neurosurgery

## 2019-04-18 DIAGNOSIS — Z01812 Encounter for preprocedural laboratory examination: Secondary | ICD-10-CM | POA: Insufficient documentation

## 2019-04-18 DIAGNOSIS — Z20828 Contact with and (suspected) exposure to other viral communicable diseases: Secondary | ICD-10-CM | POA: Diagnosis not present

## 2019-04-18 LAB — SARS CORONAVIRUS 2 (TAT 6-24 HRS): SARS Coronavirus 2: NEGATIVE

## 2019-04-22 ENCOUNTER — Encounter: Payer: Self-pay | Admitting: Neurosurgery

## 2019-04-22 ENCOUNTER — Other Ambulatory Visit: Payer: Self-pay

## 2019-04-22 ENCOUNTER — Ambulatory Visit: Payer: Medicare Other | Admitting: Anesthesiology

## 2019-04-22 ENCOUNTER — Encounter
Admission: RE | Disposition: A | Discharge status: Home / Self Care | Payer: Self-pay | Source: Home / Self Care | Attending: Neurosurgery

## 2019-04-22 ENCOUNTER — Ambulatory Visit: Payer: Medicare Other

## 2019-04-22 ENCOUNTER — Ambulatory Visit
Admission: RE | Admit: 2019-04-22 | Discharge: 2019-04-22 | Disposition: A | Discharge status: Home / Self Care | Payer: Medicare Other | Attending: Neurosurgery | Admitting: Neurosurgery

## 2019-04-22 DIAGNOSIS — M48061 Spinal stenosis, lumbar region without neurogenic claudication: Secondary | ICD-10-CM | POA: Diagnosis present

## 2019-04-22 DIAGNOSIS — Z90710 Acquired absence of both cervix and uterus: Secondary | ICD-10-CM | POA: Insufficient documentation

## 2019-04-22 DIAGNOSIS — M5416 Radiculopathy, lumbar region: Secondary | ICD-10-CM | POA: Diagnosis not present

## 2019-04-22 DIAGNOSIS — Z888 Allergy status to other drugs, medicaments and biological substances status: Secondary | ICD-10-CM | POA: Diagnosis not present

## 2019-04-22 DIAGNOSIS — Z803 Family history of malignant neoplasm of breast: Secondary | ICD-10-CM | POA: Insufficient documentation

## 2019-04-22 DIAGNOSIS — Z419 Encounter for procedure for purposes other than remedying health state, unspecified: Secondary | ICD-10-CM

## 2019-04-22 DIAGNOSIS — Z9841 Cataract extraction status, right eye: Secondary | ICD-10-CM | POA: Diagnosis not present

## 2019-04-22 DIAGNOSIS — I10 Essential (primary) hypertension: Secondary | ICD-10-CM | POA: Insufficient documentation

## 2019-04-22 DIAGNOSIS — Z79899 Other long term (current) drug therapy: Secondary | ICD-10-CM | POA: Insufficient documentation

## 2019-04-22 DIAGNOSIS — F419 Anxiety disorder, unspecified: Secondary | ICD-10-CM | POA: Insufficient documentation

## 2019-04-22 DIAGNOSIS — M1990 Unspecified osteoarthritis, unspecified site: Secondary | ICD-10-CM | POA: Insufficient documentation

## 2019-04-22 DIAGNOSIS — Z87891 Personal history of nicotine dependence: Secondary | ICD-10-CM | POA: Insufficient documentation

## 2019-04-22 DIAGNOSIS — Z961 Presence of intraocular lens: Secondary | ICD-10-CM | POA: Insufficient documentation

## 2019-04-22 DIAGNOSIS — F329 Major depressive disorder, single episode, unspecified: Secondary | ICD-10-CM | POA: Insufficient documentation

## 2019-04-22 DIAGNOSIS — Z9842 Cataract extraction status, left eye: Secondary | ICD-10-CM | POA: Diagnosis not present

## 2019-04-22 HISTORY — PX: LUMBAR LAMINECTOMY/ DECOMPRESSION WITH MET-RX: SHX5959

## 2019-04-22 LAB — TYPE AND SCREEN
ABO/RH(D): A POS
Antibody Screen: NEGATIVE

## 2019-04-22 LAB — ABO/RH: ABO/RH(D): A POS

## 2019-04-22 SURGERY — LUMBAR LAMINECTOMY/ DECOMPRESSION WITH MET-RX
Anesthesia: General | Laterality: Left

## 2019-04-22 MED ORDER — EPHEDRINE SULFATE 50 MG/ML IJ SOLN
INTRAMUSCULAR | Status: DC | PRN
  Administered 2019-04-22 (×2): 10 mg via INTRAVENOUS

## 2019-04-22 MED ORDER — PROPOFOL 500 MG/50ML IV EMUL
INTRAVENOUS | Status: DC | PRN
  Administered 2019-04-22: 180 ug/kg/min via INTRAVENOUS

## 2019-04-22 MED ORDER — THROMBIN 5000 UNITS EX SOLR
CUTANEOUS | Status: DC | PRN
  Administered 2019-04-22: 5000 [IU] via TOPICAL

## 2019-04-22 MED ORDER — ACETAMINOPHEN 10 MG/ML IV SOLN: 1000.0000 mg | Freq: Once | INTRAVENOUS | Status: DC | PRN

## 2019-04-22 MED ORDER — ACETAMINOPHEN 10 MG/ML IV SOLN
INTRAVENOUS | Status: AC
  Administered 2019-04-22: 10:00:00 1000 mg via INTRAVENOUS
  Filled 2019-04-22: qty 100

## 2019-04-22 MED ORDER — PHENYLEPHRINE HCL (PRESSORS) 10 MG/ML IV SOLN
INTRAVENOUS | Status: DC | PRN
  Administered 2019-04-22 (×2): 50 ug via INTRAVENOUS

## 2019-04-22 MED ORDER — EPINEPHRINE PF 1 MG/ML IJ SOLN
INTRAMUSCULAR | Status: AC
  Filled 2019-04-22: qty 1

## 2019-04-22 MED ORDER — DEXAMETHASONE SODIUM PHOSPHATE 10 MG/ML IJ SOLN
INTRAMUSCULAR | Status: AC
  Filled 2019-04-22: qty 1

## 2019-04-22 MED ORDER — PROPOFOL 10 MG/ML IV BOLUS
INTRAVENOUS | Status: DC | PRN
  Administered 2019-04-22: 150 mg via INTRAVENOUS

## 2019-04-22 MED ORDER — TIZANIDINE HCL 4 MG PO TABS: 4.0000 mg | ORAL_TABLET | Freq: Four times a day (QID) | ORAL | 0 refills | Status: DC | PRN

## 2019-04-22 MED ORDER — KETOROLAC TROMETHAMINE 30 MG/ML IJ SOLN
INTRAMUSCULAR | Status: DC | PRN
  Administered 2019-04-22: 30 mg via INTRAVENOUS

## 2019-04-22 MED ORDER — KETOROLAC TROMETHAMINE 30 MG/ML IJ SOLN
INTRAMUSCULAR | Status: AC
  Filled 2019-04-22: qty 1

## 2019-04-22 MED ORDER — FAMOTIDINE 20 MG PO TABS: 20.0000 mg | ORAL_TABLET | Freq: Once | ORAL | Status: AC

## 2019-04-22 MED ORDER — PROPOFOL 500 MG/50ML IV EMUL
INTRAVENOUS | Status: AC
  Filled 2019-04-22: qty 50

## 2019-04-22 MED ORDER — FAMOTIDINE 20 MG PO TABS
ORAL_TABLET | ORAL | Status: AC
  Administered 2019-04-22: 06:00:00 20 mg via ORAL
  Filled 2019-04-22: qty 1

## 2019-04-22 MED ORDER — OXYCODONE HCL 5 MG PO TABS: 5.0000 mg | ORAL_TABLET | ORAL | 0 refills | Status: DC | PRN

## 2019-04-22 MED ORDER — SUGAMMADEX SODIUM 200 MG/2ML IV SOLN
INTRAVENOUS | Status: AC
  Filled 2019-04-22: qty 2

## 2019-04-22 MED ORDER — HYDROMORPHONE HCL 1 MG/ML IJ SOLN: 0.5000 mg | INTRAMUSCULAR | Status: DC | PRN

## 2019-04-22 MED ORDER — BUPIVACAINE HCL (PF) 0.5 % IJ SOLN
INTRAMUSCULAR | Status: AC
  Filled 2019-04-22: qty 30

## 2019-04-22 MED ORDER — FENTANYL CITRATE (PF) 100 MCG/2ML IJ SOLN
INTRAMUSCULAR | Status: AC
  Filled 2019-04-22: qty 2

## 2019-04-22 MED ORDER — HYDROCODONE-ACETAMINOPHEN 7.5-325 MG PO TABS: 1.0000 | ORAL_TABLET | Freq: Once | ORAL | Status: DC | PRN

## 2019-04-22 MED ORDER — PROPOFOL 10 MG/ML IV BOLUS
INTRAVENOUS | Status: AC
  Filled 2019-04-22: qty 20

## 2019-04-22 MED ORDER — PHENYLEPHRINE HCL-NACL 10-0.9 MG/250ML-% IV SOLN
INTRAVENOUS | Status: DC | PRN
  Administered 2019-04-22: 20 ug/min via INTRAVENOUS

## 2019-04-22 MED ORDER — ONDANSETRON HCL 4 MG/2ML IJ SOLN
INTRAMUSCULAR | Status: DC | PRN
  Administered 2019-04-22: 4 mg via INTRAVENOUS

## 2019-04-22 MED ORDER — MIDAZOLAM HCL 2 MG/2ML IJ SOLN
INTRAMUSCULAR | Status: DC | PRN
  Administered 2019-04-22: 2 mg via INTRAVENOUS

## 2019-04-22 MED ORDER — LACTATED RINGERS IV SOLN: INTRAVENOUS | Status: DC

## 2019-04-22 MED ORDER — CEFAZOLIN SODIUM-DEXTROSE 2-4 GM/100ML-% IV SOLN
2.0000 g | Freq: Once | INTRAVENOUS | Status: AC
  Administered 2019-04-22: 2 g via INTRAVENOUS

## 2019-04-22 MED ORDER — FENTANYL CITRATE (PF) 100 MCG/2ML IJ SOLN
INTRAMUSCULAR | Status: DC | PRN
  Administered 2019-04-22: 100 ug via INTRAVENOUS

## 2019-04-22 MED ORDER — THROMBIN 5000 UNITS EX SOLR
CUTANEOUS | Status: AC
  Filled 2019-04-22: qty 5000

## 2019-04-22 MED ORDER — FENTANYL CITRATE (PF) 100 MCG/2ML IJ SOLN: 25.0000 ug | INTRAMUSCULAR | Status: DC | PRN

## 2019-04-22 MED ORDER — METHYLPREDNISOLONE ACETATE 40 MG/ML IJ SUSP
INTRAMUSCULAR | Status: AC
  Filled 2019-04-22: qty 1

## 2019-04-22 MED ORDER — SUCCINYLCHOLINE CHLORIDE 20 MG/ML IJ SOLN
INTRAMUSCULAR | Status: DC | PRN
  Administered 2019-04-22: 80 mg via INTRAVENOUS

## 2019-04-22 MED ORDER — ROCURONIUM BROMIDE 50 MG/5ML IV SOLN
INTRAVENOUS | Status: AC
  Filled 2019-04-22: qty 1

## 2019-04-22 MED ORDER — CEFAZOLIN SODIUM-DEXTROSE 2-4 GM/100ML-% IV SOLN
INTRAVENOUS | Status: AC
  Filled 2019-04-22: qty 100

## 2019-04-22 MED ORDER — ONDANSETRON HCL 4 MG/2ML IJ SOLN: 4.0000 mg | Freq: Once | INTRAMUSCULAR | Status: DC | PRN

## 2019-04-22 MED ORDER — LIDOCAINE HCL (PF) 1 % IJ SOLN
INTRAMUSCULAR | Status: AC
  Filled 2019-04-22: qty 30

## 2019-04-22 MED ORDER — MIDAZOLAM HCL 2 MG/2ML IJ SOLN
INTRAMUSCULAR | Status: AC
  Filled 2019-04-22: qty 2

## 2019-04-22 MED ORDER — ONDANSETRON HCL 4 MG/2ML IJ SOLN
INTRAMUSCULAR | Status: AC
  Filled 2019-04-22: qty 2

## 2019-04-22 MED ORDER — LIDOCAINE-EPINEPHRINE (PF) 1 %-1:200000 IJ SOLN
INTRAMUSCULAR | Status: DC | PRN
  Administered 2019-04-22: 9 mL

## 2019-04-22 MED ORDER — LIDOCAINE HCL (CARDIAC) PF 100 MG/5ML IV SOSY
PREFILLED_SYRINGE | INTRAVENOUS | Status: DC | PRN
  Administered 2019-04-22: 80 mg via INTRAVENOUS

## 2019-04-22 SURGICAL SUPPLY — 57 items
BUR NEURO DRILL SOFT 3.0X3.8M (BURR) ×2 IMPLANT
CANISTER SUCT 1200ML W/VALVE (MISCELLANEOUS) ×4 IMPLANT
CHLORAPREP W/TINT 26 (MISCELLANEOUS) ×4 IMPLANT
COUNTER NEEDLE 20/40 LG (NEEDLE) ×2 IMPLANT
COVER LIGHT HANDLE STERIS (MISCELLANEOUS) ×4 IMPLANT
COVER WAND RF STERILE (DRAPES) ×2 IMPLANT
CUP MEDICINE 2OZ PLAST GRAD ST (MISCELLANEOUS) ×2 IMPLANT
DERMABOND ADVANCED (GAUZE/BANDAGES/DRESSINGS) ×1
DERMABOND ADVANCED .7 DNX12 (GAUZE/BANDAGES/DRESSINGS) ×1 IMPLANT
DRAPE C-ARM 42X72 X-RAY (DRAPES) ×4 IMPLANT
DRAPE LAPAROTOMY 100X77 ABD (DRAPES) ×2 IMPLANT
DRAPE MICROSCOPE SPINE 48X150 (DRAPES) IMPLANT
DRAPE SURG 17X11 SM STRL (DRAPES) ×2 IMPLANT
DRSG TEGADERM 4X4.75 (GAUZE/BANDAGES/DRESSINGS) IMPLANT
DRSG TELFA 4X3 1S NADH ST (GAUZE/BANDAGES/DRESSINGS) IMPLANT
DURASEAL APPLICATOR TIP (TIP) IMPLANT
DURASEAL SPINE SEALANT 3ML (MISCELLANEOUS) IMPLANT
ELECT CAUTERY BLADE TIP 2.5 (TIP) ×2
ELECT EZSTD 165MM 6.5IN (MISCELLANEOUS) ×2
ELECT REM PT RETURN 9FT ADLT (ELECTROSURGICAL) ×2
ELECTRODE CAUTERY BLDE TIP 2.5 (TIP) ×1 IMPLANT
ELECTRODE EZSTD 165MM 6.5IN (MISCELLANEOUS) ×1 IMPLANT
ELECTRODE REM PT RTRN 9FT ADLT (ELECTROSURGICAL) ×1 IMPLANT
GAUZE SPONGE 4X4 12PLY STRL (GAUZE/BANDAGES/DRESSINGS) ×2 IMPLANT
GLOVE BIOGEL PI IND STRL 7.0 (GLOVE) ×1 IMPLANT
GLOVE BIOGEL PI INDICATOR 7.0 (GLOVE) ×1
GLOVE INDICATOR 8.0 STRL GRN (GLOVE) ×6 IMPLANT
GLOVE SURG SYN 7.0 (GLOVE) ×4 IMPLANT
GLOVE SURG SYN 8.0 (GLOVE) ×2 IMPLANT
GOWN STRL REUS W/ TWL XL LVL3 (GOWN DISPOSABLE) ×1 IMPLANT
GOWN STRL REUS W/TWL MED LVL3 (GOWN DISPOSABLE) ×2 IMPLANT
GOWN STRL REUS W/TWL XL LVL3 (GOWN DISPOSABLE) ×1
GRADUATE 1200CC STRL 31836 (MISCELLANEOUS) ×2 IMPLANT
KIT TURNOVER KIT A (KITS) ×2 IMPLANT
KIT WILSON FRAME (KITS) ×2 IMPLANT
KNIFE BAYONET SHORT DISCETOMY (MISCELLANEOUS) IMPLANT
MARKER SKIN DUAL TIP RULER LAB (MISCELLANEOUS) ×4 IMPLANT
NDL SAFETY ECLIPSE 18X1.5 (NEEDLE) ×1 IMPLANT
NEEDLE HYPO 18GX1.5 SHARP (NEEDLE) ×1
NEEDLE HYPO 22GX1.5 SAFETY (NEEDLE) ×2 IMPLANT
NS IRRIG 1000ML POUR BTL (IV SOLUTION) ×2 IMPLANT
PACK LAMINECTOMY NEURO (CUSTOM PROCEDURE TRAY) ×2 IMPLANT
PAD ARMBOARD 7.5X6 YLW CONV (MISCELLANEOUS) ×2 IMPLANT
SPOGE SURGIFLO 8M (HEMOSTASIS) ×1
SPONGE SURGIFLO 8M (HEMOSTASIS) ×1 IMPLANT
STAPLER SKIN PROX 35W (STAPLE) IMPLANT
SUT NURALON 4 0 TR CR/8 (SUTURE) IMPLANT
SUT POLYSORB 2-0 5X18 GS-10 (SUTURE) ×4 IMPLANT
SUT VIC AB 0 CT1 18XCR BRD 8 (SUTURE) ×2 IMPLANT
SUT VIC AB 0 CT1 8-18 (SUTURE) ×2
SYR 10ML LL (SYRINGE) ×4 IMPLANT
SYR 30ML LL (SYRINGE) ×2 IMPLANT
SYR 3ML LL SCALE MARK (SYRINGE) ×2 IMPLANT
TOWEL OR 17X26 4PK STRL BLUE (TOWEL DISPOSABLE) ×8 IMPLANT
TUBE MATRX SPINL 18MM 7CM DISP (INSTRUMENTS) ×1
TUBE METRX SPINAL 18X7 DISP (INSTRUMENTS) ×1 IMPLANT
TUBING CONNECTING 10 (TUBING) ×2 IMPLANT

## 2019-04-22 NOTE — Op Note (Signed)
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 She could not get injections given adverse reaction to steroids. MRI revealedleftL4/5 far lateralstenosis due to discherniation.We discussed decompression at that level.Therisks of surgery were explained to include hematoma, infection, damage to nerve roots, CSF leak, weakness, numbness, pain, need for future surgery including fusion, heart attack, and stroke.Sheelected to proceed with surgery for symptom relief.  Procedure The patient was brought to the OR after informed consent was obtained.She was given general anesthesia and intubated by the anesthesia service. Vascular access lines were placed.The patient was then placed prone on a Wilson frameensuring all pressure points were padded.Antibiotics were administered.A time-out was performed per protocol.   The patient was sterilely prepped and draped. Fluoroscopy confirmedL4/5interspaceandanincision was planned5-6 cm off midlineon theleft.The incision was instilled withlocal anesthetic with epinephrine. The skin was opened sharply and the dissection taken to the fascia. This was incised and initial dilator placedthe transverse process of L5on theleft. Serial dilatorswere inserted via fluoroscopy and the final82mm tube was placed at depth  of7cm.  The microscope was brought into the field. The overlying muscle was removed from transverse process and lateral facet.Next, a matchstickdrill bit was used to remove superior L5 transverse process and lateral facet at L4/5.  The muscle was dissected above transverse process until the disc space was seen. There was a firm bulge here with some small loose fragments. The nerve root was seen exiting the foramen and this was retracted superiorly. The disc space was coagulated and entered. Some small pieces of firm disc were removed and the entire disc space inspected under the nerve root. Fluoroscopy confirmed the correct level. Multiple small instruments were used to decompress the space and impact the firm portion into the disc space. This was then coagulated. No further compression was seen of the nerve root at this point. The area was irrigated profusely to remove other small fragments.Hemostasis was obtained and wound irrigated.The working channel was removed.  The microscope was removed.Themuscle andfasciawas then closed using 0and 2-0vicryl followed by thesubcutaneous and dermal layers with 2-0 vicryluntil the epidermis was well approximated. The skin was closed withDermabond..  The patient was returned to supine position and extubated by the anesthesia service. The patient was then taken to the PACU for post-operative care whereshe was moving extremities symmetrically.   ESTIMATED BLOOD LOSS: 25cc  SPECIMENS None  IMPLANT None   I performed the case in its entiretywith assistance of PA, Carmen [name], [place]

## 2019-04-22 NOTE — Transfer of Care (Signed)
Immediate Anesthesia Transfer of Care Note  Patient: Carmen [name]  Procedure(s) Performed: LEFT LATERAL L4-5 DISCECTOMY WITH METRX (Left )  Patient Location: PACU  Anesthesia Type:General  Level of Consciousness: sedated and drowsy  Airway & Oxygen Therapy: Patient Spontanous Breathing and Patient connected to face mask oxygen  Post-op Assessment: Report given to RN and Post -op Vital signs reviewed and stable  Post vital signs: Reviewed and stable  Last Vitals:  Vitals Value Taken Time  BP 116/49 04/22/19 0931  Temp    Pulse 78 04/22/19 0933  Resp 18 04/22/19 0933  SpO2 94 % 04/22/19 0933  Vitals shown include unvalidated device data.  Last Pain:  Vitals:   04/22/19 0931  TempSrc:   PainSc: (P) Asleep         Complications: No apparent anesthesia complications

## 2019-04-22 NOTE — Discharge Instructions (Addendum)
°Your surgeon has performed an operation on your lumbar spine (low back) to relieve pressure on one or more nerves. Many times, patients feel better immediately after surgery and can “overdo it.” Even if you feel well, it is important that you follow these activity guidelines. If you do not let your back heal properly from the surgery, you can increase the chance of a disc herniation and/or return of your symptoms. The following are instructions to help in your recovery once you have been discharged from the hospital. ° °* Do not take anti-inflammatory medications for 3 days after surgery (naproxen [Aleve], ibuprofen [Advil, Motrin], celecoxib [Celebrex], etc.) ° °Activity  °  °No bending, lifting, or twisting (“BLT”). Avoid lifting objects heavier than 10 pounds (gallon milk jug).  Where possible, avoid household activities that involve lifting, bending, pushing, or pulling such as laundry, vacuuming, grocery shopping, and childcare. Try to arrange for help from friends and family for these activities while your back heals. ° °Increase physical activity slowly as tolerated.  Taking short walks is encouraged, but avoid strenuous exercise. Do not jog, run, bicycle, lift weights, or participate in any other exercises unless specifically allowed by your doctor. Avoid prolonged sitting, including car rides. ° °Talk to your doctor before resuming sexual activity. ° °You should not drive until cleared by your doctor. ° °Until released by your doctor, you should not return to work or school.  You should rest at home and let your body heal.  ° °You may shower two days after your surgery.  After showering, lightly dab your incision dry. Do not take a tub bath or go swimming for 3 weeks, or until approved by your doctor at your follow-up appointment. ° °If you smoke, we strongly recommend that you quit.  Smoking has been proven to interfere with normal healing in your back and will dramatically reduce the success rate of  your surgery. Please contact [facility] (800-QUIT-NOW) and use the resources at www.[facility].com for assistance in stopping smoking. ° °Surgical Incision °  °If you have a dressing on your incision, you may remove it three days after your surgery. Keep your incision area clean and dry. ° °If you have staples or stitches on your incision, you should have a follow up scheduled for removal. If you do not have staples or stitches, you will have steri-strips (small pieces of surgical tape) or Dermabond glue. The steri-strips/glue should begin to peel away within about a week (it is fine if the steri-strips fall off before then). If the strips are still in place one week after your surgery, you may gently remove them. ° °Diet          ° ° You may return to your usual diet. Be sure to stay hydrated. ° °When to Contact Us ° °Although your surgery and recovery will likely be uneventful, you may have some residual numbness, aches, and pains in your back and/or legs. This is normal and should improve in the next few weeks. ° °However, should you experience any of the following, contact us immediately: °• New numbness or weakness °• Pain that is progressively getting worse, and is not relieved by your pain medications or rest °• Bleeding, redness, swelling, pain, or drainage from surgical incision °• Chills or flu-like symptoms °• Fever greater than 101.0 F (38.3 C) °• Problems with bowel or bladder functions °• Difficulty breathing or shortness of breath °• Warmth, tenderness, or swelling in your calf ° °Contact Information °• During office hours (Monday-Friday   9 am to 5 pm), please call your physician at [phone] °• After hours and weekends, please call the [facility] at [phone] and ask for the Neurosurgery Resident On Call  °• For a life-threatening emergency, call 911 ° °AMBULATORY SURGERY  °DISCHARGE INSTRUCTIONS ° ° °1) The drugs that you were given will stay in your system until tomorrow so for the next 24  hours you should not: ° °A) Drive an automobile °B) Make any legal decisions °C) Drink any alcoholic beverage ° ° °2) You may resume regular meals tomorrow.  Today it is better to start with liquids and gradually work up to solid foods. ° °You may eat anything you prefer, but it is better to start with liquids, then soup and crackers, and gradually work up to solid foods. ° ° °3) Please notify your doctor immediately if you have any unusual bleeding, trouble breathing, redness and pain at the surgery site, drainage, fever, or pain not relieved by medication. ° ° ° °4) Additional Instructions: ° °Please contact your physician with any problems or Same Day Surgery at [phone], Monday through Friday 6 am to 4 pm, or [facility] Main number at [phone]. ° °

## 2019-04-22 NOTE — Interval H&P Note (Signed)
History and Physical Interval Note:  04/22/2019 6:49 AM  Carmen [name]  has presented today for surgery, with the diagnosis of m54.16 lumbar radiculopathy.  The various methods of treatment have been discussed with the patient and family. After consideration of risks, benefits and other options for treatment, the patient has consented to  Procedure(s): LEFT LATERAL L4-5 DISCECTOMY WITH METRX (Left) as a surgical intervention.  The patient's history has been reviewed, patient examined, no change in status, stable for surgery.  I have reviewed the patient's chart and labs.  Questions were answered to the patient's satisfaction.     [name]

## 2019-04-22 NOTE — H&P (Signed)
Carmen [name] is an 69 y.o. female.   Chief Complaint: Left leg pain HPI: Carmen [name] is here for evaluation of ongoing left thigh pain anteriorly. She does have a history of an L4-5 decompression years ago that she describes for back pain and had no complications. She does feel like this left thigh pain is more recent but has been going on for over a year. She has done physical therapy without any relief. Given allergies, she is unable to go any steroid injections or take steroid medication. She has attempted prescription and over-the-counter medication without any relief. She denies any right leg symptoms. She denies any numbness or obvious weakness but the pain does limit her activity. She has sometimes had some pain in the anterior shin on the left but the majority is centered in the anterior thigh. MRI showed a lateral disc herniation at L4/5 and she is ready to proceed withd ecompression   Past Medical History:  Diagnosis Date  . Anxiety   . Arthritis    back  . Depression   . Hypertension    controlled on meds  . Wears partial dentures    upper    Past Surgical History:  Procedure Laterality Date  . ABDOMINAL HYSTERECTOMY    . BACK SURGERY  2009   discectomy  . CATARACT EXTRACTION W/PHACO Right 03/21/2016   Procedure: CATARACT EXTRACTION PHACO AND INTRAOCULAR LENS PLACEMENT ([facility]);  Surgeon: Ronnell Freshwater, MD;  Location: [facility];  Service: Ophthalmology;  Laterality: Right;  RIGHT  . CATARACT EXTRACTION W/PHACO Left 05/09/2016   Procedure: CATARACT EXTRACTION PHACO AND INTRAOCULAR LENS PLACEMENT ([facility])  Left;  Surgeon: Ronnell Freshwater, MD;  Location: [facility];  Service: Ophthalmology;  Laterality: Left;  . COLONOSCOPY    . KYPHOPLASTY N/A 01/18/2018   Procedure: [id number];  Surgeon: Hessie Knows, MD;  Location: [facility];  Service: Orthopedics;  Laterality: N/A;  . NECK SURGERY  2009   discectomy    Family History  Problem  Relation Age of Onset  . Breast cancer Sister 35   Social History:  reports that she quit smoking about 31 years ago. Her smoking use included cigarettes. She has a 20.00 pack-year smoking history. She has never used smokeless tobacco. She reports that she does not drink alcohol or use drugs.  Allergies:  Allergies  Allergen Reactions  . Prednisone Other (See Comments)    Severe depression    Medications Prior to Admission  Medication Sig Dispense Refill  . ARIPiprazole (ABILIFY) 2 MG tablet Take 2 mg by mouth daily.     . clonazePAM (KLONOPIN) 0.5 MG tablet Take 0.25 mg by mouth 2 (two) times daily.     Marland Kitchen KLOR-CON M10 10 MEQ tablet Take 20 mEq by mouth 3 (three) times daily.    Marland Kitchen losartan-hydrochlorothiazide (HYZAAR) 100-12.5 MG tablet Take 1 tablet by mouth daily.     . metoprolol succinate (TOPROL-XL) 50 MG 24 hr tablet Take 50 mg by mouth daily. Take with or immediately following a meal.    . OLANZapine (ZYPREXA) 10 MG tablet Take 10-20 mg by mouth See admin instructions. Take 1 tablet (10 mg) by mouth in the morning & take 2 tablets (20 mg) by mouth at bedtime.    . propranolol (INDERAL) 10 MG tablet Take 10 mg by mouth daily.    . sertraline (ZOLOFT) 100 MG tablet Take 200 mg by mouth daily.     . verapamil (CALAN-SR) 240 MG CR tablet Take 240  mg by mouth daily.  3    No results found for this or any previous visit (from the past 48 hour(s)). No results found.  Review of Systems General ROS: Negative Psychological ROS: Negative Ophthalmic ROS: Negative ENT ROS: Negative Hematological and Lymphatic ROS: Negative  Endocrine ROS: Negative Respiratory ROS: Negative Cardiovascular ROS: Negative Gastrointestinal ROS: Negative Genito-Urinary ROS: Negative Musculoskeletal ROS: Negative for back pain Neurological ROS: Positive for left thigh pain Dermatological ROS: Negative  Blood pressure (!) 145/75, pulse 87, temperature 98.3 F (36.8 C), temperature source Tympanic, resp.  rate 17, height 5\' 2"  (1.575 m), weight 77.1 kg, SpO2 97 %. Physical Exam  General appearance: Alert, cooperative, in no acute distress Head: Normocephalic, atraumatic Eyes: Normal, EOM intact Oropharynx: Wearing facemask Back: Well-healed midline incision CV: Regular rate and rhythm Pulm: Clear to auscultation Ext: Mild edema in bilateral lower extremities  Neurologic exam:  Mental status: alertness: alert, affect: normal Speech: fluent and clear Motor:strength symmetric 5/5 in bilateral lower extremities including hip flexion, knee flexion, knee extension, dorsiflexion, plantarflexion Sensory: intact to light touch in bilateral lower extremities Gait: normal     Imaging: MRI lumbar spine: There is evidence of a previous L1 fracture with kyphoplasty. There is a normal lordotic curvature with overall normal alignment. There is a small disc bulge noted at L4-5 and even moderate L5-S1. There is some mild degenerative disease throughout. There is no obvious central stenosis. There is no obvious severe lateral recess stenosis. There is a disc bulge extraforaminal he at L4-5 on the left which does cause stenosis in the foramen.  Assessment/Plan Diagnosis: Left L4 radiculopathy  We will proceed with left L4/5 discectomy  [name], MD 04/22/2019, 6:46 AM

## 2019-04-22 NOTE — Discharge Summary (Addendum)
Procedure: Left lateral L4-5 lumbar decompression Procedure date: 04/22/2019 Diagnosis: Lumbar radiculopathy   History: Carmen [name] is s/p Left lateral L4-5 lumbar decompression   POD0: Tolerated procedure well. Evaluated in post op recovery still disoriented from anesthesia.  No complaints at this time.  Physical Exam: Vitals:   04/22/19 0615  BP: (!) 145/75  Pulse: 87  Resp: 17  Temp: 98.3 F (36.8 C)  SpO2: 97%   Strength:5/5 throughout Skin: Dressing clean, dry , and intact  Data:  Recent Labs  Lab 04/16/19 1042  NA 139  K 3.6  CL 102  CO2 26  BUN 18  CREATININE 0.84  GLUCOSE 116*  CALCIUM 9.0   No results for input(s): AST, ALT, ALKPHOS in the last 168 hours.  Invalid input(s): TBILI   Recent Labs  Lab 04/16/19 1042  WBC 7.4  HGB 12.8  HCT 37.4  PLT 254   Recent Labs  Lab 04/16/19 1042  APTT <24*  INR 0.9         Other tests/results: No imaging reviewed   Assessment/Plan:  Carmen [name] is POD0 s/p left lateral L4-5 decompression. Will continue post op pain control with tylenol, muscle relaxer, and pain medication as needed. She is scheduled to follow up in clinic in approximately 2 weeks to monitor progress.    Marin Olp PA-C Department of Neurosurgery

## 2019-04-22 NOTE — Anesthesia Preprocedure Evaluation (Addendum)
Anesthesia Evaluation  Patient identified by MRN, date of birth, ID band Patient awake    Reviewed: Allergy & Precautions, NPO status , Patient's Chart, lab work & pertinent test results, reviewed documented beta blocker date and time   Airway Mallampati: II  TM Distance: >3 FB Neck ROM: Full    Dental  (+) Partial Upper, Teeth Intact, Dental Advisory Given   Pulmonary neg pulmonary ROS, Patient abstained from smoking., former smoker,    breath sounds clear to auscultation       Cardiovascular Exercise Tolerance: Good hypertension, Pt. on medications and Pt. on home beta blockers + Cardiac Stents  (-) Past MI  Rhythm:Regular Rate:Normal     Neuro/Psych PSYCHIATRIC DISORDERS Anxiety Depression negative neurological ROS     GI/Hepatic Neg liver ROS,   Endo/Other  negative endocrine ROS  Renal/GU negative Renal ROS     Musculoskeletal  (+) Arthritis , Osteoarthritis,    Abdominal   Peds  Hematology   Anesthesia Other Findings Past Medical History: No date: Anxiety No date: Arthritis     Comment:  back No date: Depression No date: Hypertension     Comment:  controlled on meds No date: Wears partial dentures     Comment:  upper   Reproductive/Obstetrics                            Anesthesia Physical Anesthesia Plan  ASA: II  Anesthesia Plan: General   Post-op Pain Management:    Induction: Intravenous  PONV Risk Score and Plan: 4 or greater and Ondansetron, Propofol infusion and Midazolam  Airway Management Planned: Oral ETT  Additional Equipment:   Intra-op Plan:   Post-operative Plan: Extubation in OR  Informed Consent: I have reviewed the patients History and Physical, chart, labs and discussed the procedure including the risks, benefits and alternatives for the proposed anesthesia with the patient or authorized representative who has indicated his/her understanding and  acceptance.     Dental advisory given  Plan Discussed with: CRNA and Surgeon  Anesthesia Plan Comments:        Anesthesia Quick Evaluation

## 2019-04-22 NOTE — Anesthesia Procedure Notes (Signed)
Procedure Name: Intubation Date/Time: 04/22/2019 7:25 AM Performed by: Gentry Fitz, CRNA Pre-anesthesia Checklist: Patient identified, Emergency Drugs available, Suction available and Patient being monitored Patient Re-evaluated:Patient Re-evaluated prior to induction Oxygen Delivery Method: Circle system utilized Preoxygenation: Pre-oxygenation with 100% oxygen Induction Type: IV induction Ventilation: Mask ventilation without difficulty Laryngoscope Size: Mac and 4 Grade View: Grade II Tube type: Oral Tube size: 7.0 mm Number of attempts: 1 Airway Equipment and Method: Stylet Placement Confirmation: ETT inserted through vocal cords under direct vision and breath sounds checked- equal and bilateral Secured at: 21 cm Tube secured with: Tape Dental Injury: Teeth and Oropharynx as per pre-operative assessment

## 2019-04-22 NOTE — Anesthesia Postprocedure Evaluation (Signed)
Anesthesia Post Note  Patient: Carmen [name]  Procedure(s) Performed: LEFT LATERAL L4-5 DISCECTOMY WITH METRX (Left )  Patient location during evaluation: PACU Anesthesia Type: General Level of consciousness: awake and alert Pain management: pain level controlled Vital Signs Assessment: post-procedure vital signs reviewed and stable Respiratory status: spontaneous breathing, nonlabored ventilation, respiratory function stable and patient connected to nasal cannula oxygen Cardiovascular status: blood pressure returned to baseline and stable Postop Assessment: no apparent nausea or vomiting Anesthetic complications: no     Last Vitals:  Vitals:   04/22/19 1001 04/22/19 1003  BP: 135/74   Pulse: 83 85  Resp: 16 17  Temp:    SpO2: 99% 99%    Last Pain:  Vitals:   04/22/19 0931  TempSrc:   PainSc: Asleep                 Arita Miss

## 2019-05-07 ENCOUNTER — Other Ambulatory Visit: Payer: Self-pay | Admitting: Student

## 2019-05-07 DIAGNOSIS — Z9889 Other specified postprocedural states: Secondary | ICD-10-CM

## 2019-05-16 ENCOUNTER — Other Ambulatory Visit: Payer: Self-pay

## 2019-05-16 ENCOUNTER — Ambulatory Visit
Admission: RE | Admit: 2019-05-16 | Discharge: 2019-05-16 | Disposition: A | Discharge status: Home / Self Care | Payer: Medicare Other | Source: Ambulatory Visit | Attending: Student | Admitting: Student

## 2019-05-16 DIAGNOSIS — Z9889 Other specified postprocedural states: Secondary | ICD-10-CM | POA: Diagnosis present

## 2019-05-17 ENCOUNTER — Ambulatory Visit: Payer: Medicare Other

## 2019-05-23 ENCOUNTER — Ambulatory Visit: Payer: Medicare Other | Attending: Internal Medicine

## 2019-05-23 DIAGNOSIS — Z23 Encounter for immunization: Secondary | ICD-10-CM | POA: Insufficient documentation

## 2019-05-23 NOTE — Progress Notes (Signed)
   Covid-19 Vaccination Clinic  Name:  Carmen [name]    MRN: [id number] DOB: 12/09/1950  05/23/2019  Carmen [name] was observed post Covid-19 immunization for 15 minutes without incidence. She was provided with Vaccine Information Sheet and instruction to access the V-Safe system.   Carmen [name] was instructed to call 911 with any severe reactions post vaccine: Marland Kitchen Difficulty breathing  . Swelling of your face and throat  . A fast heartbeat  . A bad rash all over your body  . Dizziness and weakness    Immunizations Administered    Name Date Dose VIS Date Route   Pfizer COVID-19 Vaccine 05/23/2019  4:13 AM 0.3 mL 03/29/2019 Intramuscular   Manufacturer: [place]   Lot: [id number]   [place]: [id number]

## 2019-06-07 ENCOUNTER — Ambulatory Visit: Payer: Medicare Other

## 2019-06-12 ENCOUNTER — Other Ambulatory Visit: Payer: Self-pay | Admitting: Internal Medicine

## 2019-06-12 DIAGNOSIS — Z1231 Encounter for screening mammogram for malignant neoplasm of breast: Secondary | ICD-10-CM

## 2019-06-18 ENCOUNTER — Ambulatory Visit: Payer: Medicare Other

## 2019-06-19 ENCOUNTER — Ambulatory Visit: Payer: Medicare Other | Attending: Internal Medicine

## 2019-06-19 DIAGNOSIS — Z23 Encounter for immunization: Secondary | ICD-10-CM | POA: Insufficient documentation

## 2019-06-19 NOTE — Progress Notes (Signed)
   Covid-19 Vaccination Clinic  Name:  Carmen [name]    MRN: [id number] DOB: May 27, 1950  06/19/2019  Carmen [name] was observed post Covid-19 immunization for 15 minutes without incident. She was provided with Vaccine Information Sheet and instruction to access the V-Safe system.   Carmen [name] was instructed to call 911 with any severe reactions post vaccine: Marland Kitchen Difficulty breathing  . Swelling of face and throat  . A fast heartbeat  . A bad rash all over body  . Dizziness and weakness   Immunizations Administered    Name Date Dose VIS Date Route   Pfizer COVID-19 Vaccine 06/19/2019 10:38 AM 0.3 mL 03/29/2019 Intramuscular   Manufacturer: [place]   Lot: [id number]   [place]: [id number]

## 2019-07-23 ENCOUNTER — Ambulatory Visit
Admission: RE | Admit: 2019-07-23 | Discharge: 2019-07-23 | Disposition: A | Discharge status: Home / Self Care | Payer: Medicare Other | Source: Ambulatory Visit | Attending: Internal Medicine | Admitting: Internal Medicine

## 2019-07-23 DIAGNOSIS — Z1231 Encounter for screening mammogram for malignant neoplasm of breast: Secondary | ICD-10-CM | POA: Insufficient documentation

## 2020-03-23 ENCOUNTER — Emergency Department: Payer: Medicare Other

## 2020-03-23 ENCOUNTER — Inpatient Hospital Stay
Admission: EM | Admit: 2020-03-23 | Discharge: 2020-03-26 | DRG: 683 | Disposition: A | Discharge status: Home / Self Care | Payer: Medicare Other | Attending: Internal Medicine | Admitting: Internal Medicine

## 2020-03-23 ENCOUNTER — Other Ambulatory Visit: Payer: Self-pay

## 2020-03-23 DIAGNOSIS — R197 Diarrhea, unspecified: Secondary | ICD-10-CM | POA: Diagnosis present

## 2020-03-23 DIAGNOSIS — R109 Unspecified abdominal pain: Secondary | ICD-10-CM | POA: Diagnosis present

## 2020-03-23 DIAGNOSIS — F418 Other specified anxiety disorders: Secondary | ICD-10-CM | POA: Diagnosis not present

## 2020-03-23 DIAGNOSIS — I10 Essential (primary) hypertension: Secondary | ICD-10-CM | POA: Diagnosis present

## 2020-03-23 DIAGNOSIS — Z87891 Personal history of nicotine dependence: Secondary | ICD-10-CM

## 2020-03-23 DIAGNOSIS — R112 Nausea with vomiting, unspecified: Secondary | ICD-10-CM | POA: Diagnosis not present

## 2020-03-23 DIAGNOSIS — R1084 Generalized abdominal pain: Secondary | ICD-10-CM | POA: Diagnosis not present

## 2020-03-23 DIAGNOSIS — N179 Acute kidney failure, unspecified: Principal | ICD-10-CM

## 2020-03-23 DIAGNOSIS — F419 Anxiety disorder, unspecified: Secondary | ICD-10-CM | POA: Diagnosis present

## 2020-03-23 DIAGNOSIS — Z20822 Contact with and (suspected) exposure to covid-19: Secondary | ICD-10-CM | POA: Diagnosis present

## 2020-03-23 DIAGNOSIS — N184 Chronic kidney disease, stage 4 (severe): Secondary | ICD-10-CM | POA: Diagnosis present

## 2020-03-23 DIAGNOSIS — I959 Hypotension, unspecified: Secondary | ICD-10-CM

## 2020-03-23 DIAGNOSIS — R6510 Systemic inflammatory response syndrome (SIRS) of non-infectious origin without acute organ dysfunction: Secondary | ICD-10-CM

## 2020-03-23 DIAGNOSIS — E860 Dehydration: Secondary | ICD-10-CM

## 2020-03-23 DIAGNOSIS — F32A Depression, unspecified: Secondary | ICD-10-CM | POA: Diagnosis present

## 2020-03-23 DIAGNOSIS — E876 Hypokalemia: Secondary | ICD-10-CM | POA: Diagnosis present

## 2020-03-23 LAB — COMPREHENSIVE METABOLIC PANEL
ALT: 13 U/L (ref 0–44)
AST: 17 U/L (ref 15–41)
Albumin: 4.6 g/dL (ref 3.5–5.0)
Alkaline Phosphatase: 89 U/L (ref 38–126)
Anion gap: 15 (ref 5–15)
BUN: 33 mg/dL — ABNORMAL HIGH (ref 8–23)
CO2: 26 mmol/L (ref 22–32)
Calcium: 10.4 mg/dL — ABNORMAL HIGH (ref 8.9–10.3)
Chloride: 96 mmol/L — ABNORMAL LOW (ref 98–111)
Creatinine, Ser: 1.85 mg/dL — ABNORMAL HIGH (ref 0.44–1.00)
GFR, Estimated: 29 mL/min — ABNORMAL LOW (ref >60)
Glucose, Bld: 115 mg/dL — ABNORMAL HIGH (ref 70–99)
Potassium: 4.2 mmol/L (ref 3.5–5.1)
Sodium: 137 mmol/L (ref 135–145)
Total Bilirubin: 0.9 mg/dL (ref 0.3–1.2)
Total Protein: 8.5 g/dL — ABNORMAL HIGH (ref 6.5–8.1)

## 2020-03-23 LAB — URINALYSIS, COMPLETE (UACMP) WITH MICROSCOPIC
Bilirubin Urine: NEGATIVE
Glucose, UA: NEGATIVE mg/dL
Ketones, ur: NEGATIVE mg/dL
Nitrite: NEGATIVE
Protein, ur: NEGATIVE mg/dL
Specific Gravity, Urine: 1.018 (ref 1.005–1.030)
pH: 5 (ref 5.0–8.0)

## 2020-03-23 LAB — CBC
HCT: 43.6 % (ref 36.0–46.0)
Hemoglobin: 14.8 g/dL (ref 12.0–15.0)
MCH: 31.2 pg (ref 26.0–34.0)
MCHC: 33.9 g/dL (ref 30.0–36.0)
MCV: 92 fL (ref 80.0–100.0)
Platelets: 331 10*3/uL (ref 150–400)
RBC: 4.74 MIL/uL (ref 3.87–5.11)
RDW: 12.3 % (ref 11.5–15.5)
WBC: 15 10*3/uL — ABNORMAL HIGH (ref 4.0–10.5)
nRBC: 0 % (ref 0.0–0.2)

## 2020-03-23 LAB — LACTIC ACID, PLASMA
Lactic Acid, Venous: 2 mmol/L (ref 0.5–1.9)
Lactic Acid, Venous: 0.9 mmol/L (ref 0.5–1.9)

## 2020-03-23 LAB — LIPASE, BLOOD: Lipase: 35 U/L (ref 11–51)

## 2020-03-23 LAB — RESP PANEL BY RT-PCR (FLU A&B, COVID) ARPGX2
Influenza A by PCR: NEGATIVE
Influenza B by PCR: NEGATIVE
SARS Coronavirus 2 by RT PCR: NEGATIVE

## 2020-03-23 LAB — APTT: aPTT: <24 seconds — ABNORMAL LOW (ref 24–36)

## 2020-03-23 LAB — PROTIME-INR
INR: 1 (ref 0.8–1.2)
Prothrombin Time: 12.6 seconds (ref 11.4–15.2)

## 2020-03-23 LAB — HIV ANTIBODY (ROUTINE TESTING W REFLEX): HIV Screen 4th Generation wRfx: NONREACTIVE

## 2020-03-23 MED ORDER — MORPHINE SULFATE (PF) 2 MG/ML IV SOLN: 1.0000 mg | INTRAVENOUS | Status: DC | PRN

## 2020-03-23 MED ORDER — SODIUM CHLORIDE 0.9 % IV BOLUS
500.0000 mL | Freq: Once | INTRAVENOUS | Status: AC
  Administered 2020-03-23: 500 mL via INTRAVENOUS

## 2020-03-23 MED ORDER — SODIUM CHLORIDE 0.9 % IV BOLUS
1000.0000 mL | Freq: Once | INTRAVENOUS | Status: AC
  Administered 2020-03-23: 1000 mL via INTRAVENOUS

## 2020-03-23 MED ORDER — ONDANSETRON HCL 4 MG/2ML IJ SOLN: 4.0000 mg | Freq: Three times a day (TID) | INTRAMUSCULAR | Status: DC | PRN

## 2020-03-23 MED ORDER — OLANZAPINE 10 MG PO TABS: 10.0000 mg | ORAL_TABLET | ORAL | Status: DC

## 2020-03-23 MED ORDER — CLONAZEPAM 0.5 MG PO TABS: 0.2500 mg | ORAL_TABLET | Freq: Two times a day (BID) | ORAL | Status: DC

## 2020-03-23 MED ORDER — HEPARIN SODIUM (PORCINE) 5000 UNIT/ML IJ SOLN
5000.0000 [IU] | Freq: Three times a day (TID) | INTRAMUSCULAR | Status: DC
  Administered 2020-03-23 – 2020-03-26 (×9): 5000 [IU] via SUBCUTANEOUS
  Filled 2020-03-23 (×8): qty 1

## 2020-03-23 MED ORDER — LACTATED RINGERS IV SOLN: INTRAVENOUS | Status: DC

## 2020-03-23 MED ORDER — ACETAMINOPHEN 325 MG PO TABS: 650.0000 mg | ORAL_TABLET | Freq: Four times a day (QID) | ORAL | Status: DC | PRN

## 2020-03-23 MED ORDER — SERTRALINE HCL 50 MG PO TABS
200.0000 mg | ORAL_TABLET | Freq: Every day | ORAL | Status: DC
  Administered 2020-03-24 – 2020-03-26 (×3): 200 mg via ORAL
  Filled 2020-03-23 (×3): qty 4

## 2020-03-23 MED ORDER — LACTATED RINGERS IV SOLN: INTRAVENOUS | Status: AC

## 2020-03-23 MED ORDER — SODIUM CHLORIDE 0.9 % IV BOLUS: 500.0000 mL | Freq: Once | INTRAVENOUS | Status: DC

## 2020-03-23 MED ORDER — SODIUM CHLORIDE 0.9 % IV SOLN: INTRAVENOUS | Status: DC

## 2020-03-23 MED ORDER — ONDANSETRON HCL 4 MG/2ML IJ SOLN
4.0000 mg | Freq: Once | INTRAMUSCULAR | Status: AC
  Administered 2020-03-23: 4 mg via INTRAVENOUS
  Filled 2020-03-23: qty 2

## 2020-03-23 MED ORDER — CLONAZEPAM 0.25 MG PO TBDP
0.2500 mg | ORAL_TABLET | Freq: Two times a day (BID) | ORAL | Status: DC
  Administered 2020-03-23 – 2020-03-26 (×6): 0.25 mg via ORAL
  Filled 2020-03-23 (×6): qty 1

## 2020-03-23 MED ORDER — METOCLOPRAMIDE HCL 5 MG/ML IJ SOLN
10.0000 mg | Freq: Once | INTRAMUSCULAR | Status: AC
  Administered 2020-03-23: 10 mg via INTRAVENOUS
  Filled 2020-03-23: qty 2

## 2020-03-23 MED ORDER — ARIPIPRAZOLE 2 MG PO TABS
2.0000 mg | ORAL_TABLET | Freq: Every day | ORAL | Status: DC
  Administered 2020-03-25 – 2020-03-26 (×2): 2 mg via ORAL
  Filled 2020-03-23 (×3): qty 1

## 2020-03-23 NOTE — ED Notes (Signed)
Pt moved to room 37.  Pt alert  Iv fluids infusing.  Denies any pain.  No n/v/d  Pt waiting on admission.

## 2020-03-23 NOTE — ED Notes (Signed)
Pt given drink at thsi time

## 2020-03-23 NOTE — ED Triage Notes (Signed)
Pt c/o epigastric pain with N/V/D that started on Friday, states the diarrhea stopped and has not had diarrhea since Saturday. Pt is hypotensive on arrival.. denies any black or bloody stools. Pt is a/ox4 at present.

## 2020-03-23 NOTE — ED Notes (Signed)
Patient to Ct at this time

## 2020-03-23 NOTE — ED Provider Notes (Signed)
[facility] Emergency Department Provider Note    First MD Initiated Contact with Patient 03/23/20 1203     (approximate)  I have reviewed the triage vital signs and the nursing notes.   HISTORY  Chief Complaint Abdominal Pain and Hypotension    HPI Carmen [name] is a 69 y.o. female the below listed past medical history presents to the ER for several days of intractable nausea vomiting.  Started having some nonbloody nonmelanotic diarrheal stools over the weekend.  No known sick contacts.  Worried that she might of had some food poisoning.  Started feeling weak lightheaded like she was about to faint with ambulation.  Was having crampy abdominal pain so she came to hospital for further evaluation.    Past Medical History:  Diagnosis Date  . Anxiety   . Arthritis    back  . Depression   . Hypertension    controlled on meds  . Wears partial dentures    upper   Family History  Problem Relation Age of Onset  . Breast cancer Sister 53   Past Surgical History:  Procedure Laterality Date  . ABDOMINAL HYSTERECTOMY    . BACK SURGERY  2009   discectomy  . CATARACT EXTRACTION W/PHACO Right 03/21/2016   Procedure: CATARACT EXTRACTION PHACO AND INTRAOCULAR LENS PLACEMENT (IOC);  Surgeon: Ronnell Freshwater, MD;  Location: [place];  Service: Ophthalmology;  Laterality: Right;  RIGHT  . CATARACT EXTRACTION W/PHACO Left 05/09/2016   Procedure: CATARACT EXTRACTION PHACO AND INTRAOCULAR LENS PLACEMENT ([place])  Left;  Surgeon: Ronnell Freshwater, MD;  Location: [place];  Service: Ophthalmology;  Laterality: Left;  . COLONOSCOPY    . KYPHOPLASTY N/A 01/18/2018   Procedure: ZWCHENIDPOE-U2;  Surgeon: Hessie Knows, MD;  Location: [facility];  Service: Orthopedics;  Laterality: N/A;  . LUMBAR LAMINECTOMY/ DECOMPRESSION WITH MET-RX Left 04/22/2019   Procedure: LEFT LATERAL L4-5 DISCECTOMY WITH METRX;  Surgeon: [name], MD;   Location: [facility];  Service: Neurosurgery;  Laterality: Left;  . NECK SURGERY  2009   discectomy   Patient Active Problem List   Diagnosis Date Noted  . Intractable nausea and vomiting 03/23/2020  . Abdominal pain 03/23/2020  . Depression with anxiety   . SIRS (systemic inflammatory response syndrome) (HCC)   . Hypotension   . Dehydration   . AKI (acute kidney injury) ([place])       Prior to Admission medications   Medication Sig Start Date End Date Taking? Authorizing Provider  ARIPiprazole (ABILIFY) 2 MG tablet Take 2 mg by mouth daily.  03/03/19  Yes [provider]  clonazePAM (KLONOPIN) 0.5 MG tablet Take 0.25 mg by mouth 2 (two) times daily.    Yes [provider]  KLOR-CON M10 10 MEQ tablet Take 20 mEq by mouth 3 (three) times daily. 02/23/19  Yes [provider]  losartan-hydrochlorothiazide (HYZAAR) 100-12.5 MG tablet Take 1 tablet by mouth daily.    Yes [provider]  metoprolol succinate (TOPROL-XL) 50 MG 24 hr tablet Take 50 mg by mouth daily. Take with or immediately following a meal.   Yes [provider]  OLANZapine (ZYPREXA) 10 MG tablet Take 10-20 mg by mouth See admin instructions. Take 1 tablet (10 mg) by mouth in the morning & take 2 tablets (20 mg) by mouth at bedtime.   Yes [provider]  propranolol (INDERAL) 10 MG tablet Take 10 mg by mouth daily. 01/05/19  Yes [provider]  sertraline (  ZOLOFT) 100 MG tablet Take 200 mg by mouth daily.    Yes [provider]  verapamil (CALAN-SR) 240 MG CR tablet Take 240 mg by mouth daily. 11/30/17  Yes [provider]    Allergies Prednisone    Social History Social History   Tobacco Use  . Smoking status: Former Smoker    Packs/day: 1.00    Years: 20.00    Pack years: 20.00    Types: Cigarettes    Quit date: 1990    Years since quitting: 31.9  . Smokeless tobacco: Never Used  . Tobacco comment: quit 32 years ago  Vaping Use  .  Vaping Use: Never used  Substance Use Topics  . Alcohol use: No  . Drug use: No    Review of Systems Patient denies headaches, rhinorrhea, blurry vision, numbness, shortness of breath, chest pain, edema, cough, abdominal pain, nausea, vomiting, diarrhea, dysuria, fevers, rashes or hallucinations unless otherwise stated above in HPI. ____________________________________________   PHYSICAL EXAM:  VITAL SIGNS: Vitals:   03/23/20 1634 03/23/20 1730  BP: (!) 97/48 (!) 105/42  Pulse: 65 69  Resp: 18 18  Temp:    SpO2: 99% 94%    Constitutional: Alert and oriented.  Eyes: Conjunctivae are normal.  Head: Atraumatic. Nose: No congestion/rhinnorhea. Mouth/Throat: Mucous membranes are moist.   Neck: No stridor. Painless ROM.  Cardiovascular: Normal rate, regular rhythm. Grossly normal heart sounds.  Good peripheral circulation. Respiratory: Normal respiratory effort.  No retractions. Lungs CTAB. Gastrointestinal: Soft and nontender. No distention. No abdominal bruits. No CVA tenderness. Genitourinary:  Musculoskeletal: No lower extremity tenderness nor edema.  No joint effusions. Neurologic:  Normal speech and language. No gross focal neurologic deficits are appreciated. No facial droop Skin:  Skin is warm, dry and intact. No rash noted. Psychiatric: Mood and affect are normal. Speech and behavior are normal.  ____________________________________________   LABS (all labs ordered are listed, but only abnormal results are displayed)  Results for orders placed or performed during the hospital encounter of 03/23/20 (from the past 24 hour(s))  Lipase, blood     Status: None   Collection Time: 03/23/20 11:12 AM  Result Value Ref Range   Lipase 35 11 - 51 U/L  Comprehensive metabolic panel     Status: Abnormal   Collection Time: 03/23/20 11:12 AM  Result Value Ref Range   Sodium 137 135 - 145 mmol/L   Potassium 4.2 3.5 - 5.1 mmol/L   Chloride 96 (L) 98 - 111 mmol/L   CO2 26 22 -  32 mmol/L   Glucose, Bld 115 (H) 70 - 99 mg/dL   BUN 33 (H) 8 - 23 mg/dL   Creatinine, Ser 1.85 (H) 0.44 - 1.00 mg/dL   Calcium 10.4 (H) 8.9 - 10.3 mg/dL   Total Protein 8.5 (H) 6.5 - 8.1 g/dL   Albumin 4.6 3.5 - 5.0 g/dL   AST 17 15 - 41 U/L   ALT 13 0 - 44 U/L   Alkaline Phosphatase 89 38 - 126 U/L   Total Bilirubin 0.9 0.3 - 1.2 mg/dL   GFR, Estimated 29 (L) >60 mL/min   Anion gap 15 5 - 15  CBC     Status: Abnormal   Collection Time: 03/23/20 11:12 AM  Result Value Ref Range   WBC 15.0 (H) 4.0 - 10.5 K/uL   RBC 4.74 3.87 - 5.11 MIL/uL   Hemoglobin 14.8 12.0 - 15.0 g/dL   HCT 43.6 36 - 46 %   MCV  92.0 80.0 - 100.0 fL   MCH 31.2 26.0 - 34.0 pg   MCHC 33.9 30.0 - 36.0 g/dL   RDW 12.3 11.5 - 15.5 %   Platelets 331 150 - 400 K/uL   nRBC 0.0 0.0 - 0.2 %  Lactic acid, plasma     Status: Abnormal   Collection Time: 03/23/20 11:12 AM  Result Value Ref Range   Lactic Acid, Venous 2.0 (HH) 0.5 - 1.9 mmol/L  Lactic acid, plasma     Status: None   Collection Time: 03/23/20 12:30 PM  Result Value Ref Range   Lactic Acid, Venous 0.9 0.5 - 1.9 mmol/L  Protime-INR     Status: None   Collection Time: 03/23/20 12:30 PM  Result Value Ref Range   Prothrombin Time 12.6 11.4 - 15.2 seconds   INR 1.0 0.8 - 1.2  APTT     Status: Abnormal   Collection Time: 03/23/20 12:30 PM  Result Value Ref Range   aPTT <24 (L) 24 - 36 seconds  Resp Panel by RT-PCR (Flu A&B, Covid) Nasopharyngeal Swab     Status: None   Collection Time: 03/23/20  1:26 PM   Specimen: Nasopharyngeal Swab; Nasopharyngeal(NP) swabs in vial transport medium  Result Value Ref Range   SARS Coronavirus 2 by RT PCR NEGATIVE NEGATIVE   Influenza A by PCR NEGATIVE NEGATIVE   Influenza B by PCR NEGATIVE NEGATIVE   ____________________________________________  EKG My review and personal interpretation at Time:  11:14    Indication: hypotension  Rate: 105  Rhythm: sinus Axis: normal Other: nonspecific st abn, no  stemi ____________________________________________  RADIOLOGY  I personally reviewed all radiographic images ordered to evaluate for the above acute complaints and reviewed radiology reports and findings.  These findings were personally discussed with the patient.  Please see medical record for radiology report.  ____________________________________________   PROCEDURES  Procedure(s) performed:  Procedures    Critical Care performed: no ____________________________________________   INITIAL IMPRESSION / ASSESSMENT AND PLAN / ED COURSE  Pertinent labs & imaging results that were available during my care of the patient were reviewed by me and considered in my medical decision making (see chart for details).   DDX: Dehydration, SBO, enteritis, diverticulitis, sepsis, electrolyte abnormality, AKI  Eden Toohey is a 69 y.o. who presents to the ED with presentation as described above.  Patient hypotensive describing symptoms of significant dehydration.  Given her presenting complaints CT imaging was ordered which does show evidence of possible enteritis versus developing ileus or partial small bowel obstruction.  She is not severely tender she not actively vomiting right now also have a lower suspicion for small bowel obstruction but as she does have evidence of fairly significant AKI with persistent low blood pressures despite IV fluids will give additional IV fluid resuscitation will discuss with hospitalist for admission.     The patient was evaluated in Emergency Department today for the symptoms described in the history of present illness. He/she was evaluated in the context of the global COVID-19 pandemic, which necessitated consideration that the patient might be at risk for infection with the SARS-CoV-2 virus that causes COVID-19. Institutional protocols and algorithms that pertain to the evaluation of patients at risk for COVID-19 are in a state of rapid change based on  information released by regulatory bodies including the CDC and federal and state organizations. These policies and algorithms were followed during the patient's care in the ED.  As part of my medical decision making, I reviewed  the following data within the [place] notes reviewed and incorporated, Labs reviewed, notes from prior ED visits and [place] Controlled Substance Database   ____________________________________________   FINAL CLINICAL IMPRESSION(S) / ED DIAGNOSES  Final diagnoses:  Dehydration  Nausea vomiting and diarrhea      NEW MEDICATIONS STARTED DURING THIS VISIT:  New Prescriptions   No medications on file     Note:  This document was prepared using Dragon voice recognition software and may include unintentional dictation errors.    Merlyn Lot, MD 03/23/20 [phone]

## 2020-03-23 NOTE — H&P (Signed)
History and Physical    Carmen [name] QIO:[id number] DOB: May 13, 1950 DOA: 03/23/2020  Referring MD/NP/PA:   PCP: Idelle Crouch, MD   Patient coming from:  The patient is coming from home.  At baseline, pt is independent for most of ADL.        Chief Complaint: Nausea, vomiting, diarrhea, abdominal pain  HPI: Carmen [name] is a 69 y.o. female with medical history significant of hypertension, depression, anxiety, who presents with nausea vomiting, diarrhea and abdominal pain.  Patient states that she has been having nausea, vomiting, diarrhea and abdominal pain for several days.  Her diarrhea has resolved Saturday.  She states that she has approximately 4 times of nonbloody nonbilious vomiting each day.  Her abdominal pain is located in central abdomen, constant, sharp, 6 out of 10 in severity initially, currently minimal, crampy-like pain nonradiating.  No bloody stool.  Patient denies fever or chills.  No chest pain, cough, shortness breath, symptoms of UTI.  No hematuria.  Patient was initially hypotensive with blood pressure 74/44, which improved to 96/48 after giving 1.5 L normal saline bolus in the ED.  ED Course: pt was found to have WBC 15.0, lactic acid 2.0, 0.9, INR 1.0, PTT> 24, negative Covid PCR, AKI with creatinine 1.85, BUN 33 (creatinine 0.84 on 04/16/2019), temperature normal, tachycardia with heart rate of 107, RR 18, oxygen saturation 95% on room air.  CT abdomen/pelvis showed possible ileus versus enteritis versus developing small bowel obstruction. Patient is placed on MedSurg bed for position  CT abdomen/pelvis: 1. Mildly dilated fluid-filled loops of small bowel within the mid to lower abdomen with fecalization of small bowel content. No clearly defined transition point. Findings may represent an ileus/enteritis versus developing small bowel obstruction. 2. No evidence of acute inflammatory process of the colon. Normal appendix. 3. Chronic compression  fracture of the L1 vertebral bodies status post cement augmentation. 4. Aortic atherosclerosis. (ICD10-I70.0).  Review of Systems:   General: no fevers, chills, no body weight gain, has poor appetite, has fatigue HEENT: no blurry vision, hearing changes or sore throat Respiratory: no dyspnea, coughing, wheezing CV: no chest pain, no palpitations GI: has nausea, vomiting, abdominal pain, diarrhea, no constipation GU: no dysuria, burning on urination, increased urinary frequency, hematuria  Ext: no leg edema Neuro: no unilateral weakness, numbness, or tingling, no vision change or hearing loss Skin: no rash, no skin tear. MSK: No muscle spasm, no deformity, no limitation of range of movement in spin Heme: No easy bruising.  Travel history: No recent long distant travel.  Allergy:  Allergies  Allergen Reactions  . Prednisone Other (See Comments)    Severe depression    Past Medical History:  Diagnosis Date  . Anxiety   . Arthritis    back  . Depression   . Hypertension    controlled on meds  . Wears partial dentures    upper    Past Surgical History:  Procedure Laterality Date  . ABDOMINAL HYSTERECTOMY    . BACK SURGERY  2009   discectomy  . CATARACT EXTRACTION W/PHACO Right 03/21/2016   Procedure: CATARACT EXTRACTION PHACO AND INTRAOCULAR LENS PLACEMENT (IOC);  Surgeon: Ronnell Freshwater, MD;  Location: [facility];  Service: Ophthalmology;  Laterality: Right;  RIGHT  . CATARACT EXTRACTION W/PHACO Left 05/09/2016   Procedure: CATARACT EXTRACTION PHACO AND INTRAOCULAR LENS PLACEMENT ([place])  Left;  Surgeon: Ronnell Freshwater, MD;  Location: [facility];  Service: Ophthalmology;  Laterality: Left;  . COLONOSCOPY    .  KYPHOPLASTY N/A 01/18/2018   Procedure: [id number];  Surgeon: Hessie Knows, MD;  Location: [facility];  Service: Orthopedics;  Laterality: N/A;  . LUMBAR LAMINECTOMY/ DECOMPRESSION WITH MET-RX Left 04/22/2019   Procedure: LEFT  LATERAL L4-5 DISCECTOMY WITH METRX;  Surgeon: [name], MD;  Location: [facility];  Service: Neurosurgery;  Laterality: Left;  . NECK SURGERY  2009   discectomy    Social History:  reports that she quit smoking about 31 years ago. Her smoking use included cigarettes. She has a 20.00 pack-year smoking history. She has never used smokeless tobacco. She reports that she does not drink alcohol and does not use drugs.  Family History:  Family History  Problem Relation Age of Onset  . Breast cancer Sister 68     Prior to Admission medications   Medication Sig Start Date End Date Taking? Authorizing Provider  ARIPiprazole (ABILIFY) 2 MG tablet Take 2 mg by mouth daily.  03/03/19   [provider]  clonazePAM (KLONOPIN) 0.5 MG tablet Take 0.25 mg by mouth 2 (two) times daily.     [provider]  KLOR-CON M10 10 MEQ tablet Take 20 mEq by mouth 3 (three) times daily. 02/23/19   [provider]  losartan-hydrochlorothiazide (HYZAAR) 100-12.5 MG tablet Take 1 tablet by mouth daily.     [provider]  metoprolol succinate (TOPROL-XL) 50 MG 24 hr tablet Take 50 mg by mouth daily. Take with or immediately following a meal.    [provider]  OLANZapine (ZYPREXA) 10 MG tablet Take 10-20 mg by mouth See admin instructions. Take 1 tablet (10 mg) by mouth in the morning & take 2 tablets (20 mg) by mouth at bedtime.    [provider]  oxyCODONE (ROXICODONE) 5 MG immediate release tablet Take 1 tablet (5 mg total) by mouth every 4 (four) hours as needed for breakthrough pain. 04/22/19   Marin Olp, PA-C  propranolol (INDERAL) 10 MG tablet Take 10 mg by mouth daily. 01/05/19   [provider]  sertraline (ZOLOFT) 100 MG tablet Take 200 mg by mouth daily.     [provider]  tiZANidine (ZANAFLEX) 4 MG tablet Take 1 tablet (4 mg total) by mouth every 6 (six) hours as needed for muscle spasms. 04/22/19   Marin Olp, PA-C  verapamil  (CALAN-SR) 240 MG CR tablet Take 240 mg by mouth daily. 11/30/17   [provider]    Physical Exam: Vitals:   03/23/20 1300 03/23/20 1332 03/23/20 1422 03/23/20 1459  BP: (!) 94/49 (!) 96/48 (!) 99/56 (!) 105/54  Pulse: 71 67 64 69  Resp: $Remo'18 18 18 18  'BFsNV$ Temp:      TempSrc:      SpO2: 95% 95% 94% 95%  Weight:      Height:       General: Not in acute distress.  Dry mucous membrane HEENT:       Eyes: PERRL, EOMI, no scleral icterus.       ENT: No discharge from the ears and nose, no pharynx injection, no tonsillar enlargement.        Neck: No JVD, no bruit, no mass felt. Heme: No neck lymph node enlargement. Cardiac: S1/S2, RRR, No murmurs, No gallops or rubs. Respiratory:  No rales, wheezing, rhonchi or rubs. GI: Soft, nondistended, mild tenderness in central abdomen, no rebound pain, no organomegaly, BS present. GU: No hematuria Ext: No pitting leg edema bilaterally. 1+DP/PT pulse bilaterally. Musculoskeletal: No joint deformities, No joint redness or warmth,  no limitation of ROM in spin. Skin: No rashes.  Neuro: Alert, oriented X3, cranial nerves II-XII grossly intact, moves all extremities normally. Psych: Patient is not psychotic, no suicidal or hemocidal ideation.  Labs on Admission: I have personally reviewed following labs and imaging studies  CBC: Recent Labs  Lab 03/23/20 1112  WBC 15.0*  HGB 14.8  HCT 43.6  MCV 92.0  PLT 161   Basic Metabolic Panel: Recent Labs  Lab 03/23/20 1112  NA 137  K 4.2  CL 96*  CO2 26  GLUCOSE 115*  BUN 33*  CREATININE 1.85*  CALCIUM 10.4*   GFR: Estimated Creatinine Clearance: 26.6 mL/min (A) (by C-G formula based on SCr of 1.85 mg/dL (H)). Liver Function Tests: Recent Labs  Lab 03/23/20 1112  AST 17  ALT 13  ALKPHOS 89  BILITOT 0.9  PROT 8.5*  ALBUMIN 4.6   Recent Labs  Lab 03/23/20 1112  LIPASE 35   No results for input(s): AMMONIA in the last 168 hours. Coagulation Profile: Recent Labs  Lab  03/23/20 1230  INR 1.0   Cardiac Enzymes: No results for input(s): CKTOTAL, CKMB, CKMBINDEX, TROPONINI in the last 168 hours. BNP (last 3 results) No results for input(s): PROBNP in the last 8760 hours. HbA1C: No results for input(s): HGBA1C in the last 72 hours. CBG: No results for input(s): GLUCAP in the last 168 hours. Lipid Profile: No results for input(s): CHOL, HDL, LDLCALC, TRIG, CHOLHDL, LDLDIRECT in the last 72 hours. Thyroid Function Tests: No results for input(s): TSH, T4TOTAL, FREET4, T3FREE, THYROIDAB in the last 72 hours. Anemia Panel: No results for input(s): VITAMINB12, FOLATE, FERRITIN, TIBC, IRON, RETICCTPCT in the last 72 hours. Urine analysis:    Component Value Date/Time   COLORURINE YELLOW (A) 04/16/2019 1042   APPEARANCEUR CLEAR (A) 04/16/2019 1042   LABSPEC 1.019 04/16/2019 1042   PHURINE 5.0 04/16/2019 1042   GLUCOSEU NEGATIVE 04/16/2019 1042   HGBUR NEGATIVE 04/16/2019 1042   BILIRUBINUR NEGATIVE 04/16/2019 1042   KETONESUR NEGATIVE 04/16/2019 1042   PROTEINUR NEGATIVE 04/16/2019 1042   NITRITE NEGATIVE 04/16/2019 1042   LEUKOCYTESUR SMALL (A) 04/16/2019 1042   Sepsis Labs: $RemoveBefo'[email or web address]'diuGrdlREor$ (procalcitonin:4,lacticidven:4) ) Recent Results (from the past 240 hour(s))  Resp Panel by RT-PCR (Flu A&B, Covid) Nasopharyngeal Swab     Status: None   Collection Time: 03/23/20  1:26 PM   Specimen: Nasopharyngeal Swab; Nasopharyngeal(NP) swabs in vial transport medium  Result Value Ref Range Status   SARS Coronavirus 2 by RT PCR NEGATIVE NEGATIVE Final    Comment: (NOTE) SARS-CoV-2 target nucleic acids are NOT DETECTED.  The SARS-CoV-2 RNA is generally detectable in upper respiratory specimens during the acute phase of infection. The lowest concentration of SARS-CoV-2 viral copies this assay can detect is 138 copies/mL. A negative result does not preclude SARS-Cov-2 infection and should not be used as the sole basis for treatment or other patient management  decisions. A negative result may occur with  improper specimen collection/handling, submission of specimen other than nasopharyngeal swab, presence of viral mutation(s) within the areas targeted by this assay, and inadequate number of viral copies(<138 copies/mL). A negative result must be combined with clinical observations, patient history, and epidemiological information. The expected result is Negative.  Fact Sheet for Patients:  EntrepreneurPulse.com.au  Fact Sheet for Healthcare Providers:  IncredibleEmployment.be  This test is no t yet approved or cleared by the [place] FDA and  has been authorized for detection and/or diagnosis of SARS-CoV-2 by FDA under an Emergency Use  Authorization (EUA). This EUA will remain  in effect (meaning this test can be used) for the duration of the COVID-19 declaration under Section 564(b)(1) of the Act, 21 U.S.C.section 360bbb-3(b)(1), unless the authorization is terminated  or revoked sooner.       Influenza A by PCR NEGATIVE NEGATIVE Final   Influenza B by PCR NEGATIVE NEGATIVE Final    Comment: (NOTE) The Xpert Xpress SARS-CoV-2/FLU/RSV plus assay is intended as an aid in the diagnosis of influenza from Nasopharyngeal swab specimens and should not be used as a sole basis for treatment. Nasal washings and aspirates are unacceptable for Xpert Xpress SARS-CoV-2/FLU/RSV testing.  Fact Sheet for Patients: BloggerCourse.com  Fact Sheet for Healthcare Providers: SeriousBroker.it  This test is not yet approved or cleared by the [place] FDA and has been authorized for detection and/or diagnosis of SARS-CoV-2 by FDA under an Emergency Use Authorization (EUA). This EUA will remain in effect (meaning this test can be used) for the duration of the COVID-19 declaration under Section 564(b)(1) of the Act, 21 U.S.C. section 360bbb-3(b)(1), unless the  authorization is terminated or revoked.  Performed at [facility], [address] [place], [place] [address]      Radiological Exams on Admission: CT ABDOMEN PELVIS WO CONTRAST  Result Date: 03/23/2020 CLINICAL DATA:  Epigastric pain with nausea, vomiting, diarrhea EXAM: CT ABDOMEN AND PELVIS WITHOUT CONTRAST TECHNIQUE: Multidetector CT imaging of the abdomen and pelvis was performed following the standard protocol without IV contrast. COMPARISON:  11/09/2010 FINDINGS: Lower chest: No acute abnormality. Hepatobiliary: Unremarkable unenhanced appearance of the liver. No focal liver lesion identified. Gallbladder within normal limits. No hyperdense gallstone. No biliary dilatation. Pancreas: Unremarkable. No pancreatic ductal dilatation or surrounding inflammatory changes. Spleen: Normal in size without focal abnormality. Adrenals/Urinary Tract: Unremarkable adrenal glands. Kidneys within normal limits without focal lesion, stone, or hydronephrosis. Ureters are nondilated. Urinary bladder appears unremarkable. Stomach/Bowel: Mildly dilated fluid-filled loops of small bowel within the mid to lower abdomen with fecalization of small bowel content distally. Small bowel measures up to 3.4 cm in diameter. No clearly defined transition point. A normal appendix is present in the right lower quadrant (series 2, image 48). Colon is within normal limits. No diverticular disease, focal bowel wall thickening, or inflammatory process. Vascular/Lymphatic: Scattered aortoiliac atherosclerotic calcifications without aneurysm. No abdominopelvic lymphadenopathy. Reproductive: Status post hysterectomy. No adnexal masses. Other: No free fluid. No abdominopelvic fluid collection. No pneumoperitoneum. No abdominal wall hernia. Musculoskeletal: Chronic compression fracture of the L1 vertebral bodies status post cement augmentation. Mild thoracolumbar spondylosis. Degenerative changes of the bilateral hips. Acute  osseous findings. IMPRESSION: 1. Mildly dilated fluid-filled loops of small bowel within the mid to lower abdomen with fecalization of small bowel content. No clearly defined transition point. Findings may represent an ileus/enteritis versus developing small bowel obstruction. 2. No evidence of acute inflammatory process of the colon. Normal appendix. 3. Chronic compression fracture of the L1 vertebral bodies status post cement augmentation. 4. Aortic atherosclerosis. (ICD10-I70.0). Electronically Signed   By: Duanne Guess D.O.   On: 03/23/2020 12:58     EKG: I have personally reviewed.  Sinus rhythm, QTC 420, low voltage, nonspecific T wave change  Assessment/Plan Principal Problem:   Intractable nausea and vomiting Active Problems:   Abdominal pain   Depression   Depression with anxiety   SIRS (systemic inflammatory response syndrome) (HCC)   Hypotension   Dehydration   AKI (acute kidney injury) (HCC)   Intractable nausea and vomiting and AP: Etiology  is not clear.  Patient had diarrhea which has resolved.  CT scan showed possible ileus/enteritis versus developing small bowel obstruction.  Currently patient does not have active vomiting.  Will not need NG tube placement.  -Placed on MedSurg [place] for observation -As needed morphine for pain, Zofran for nausea -IV fluid: 1.5 L normal saline, followed by 125 cc LR -Observe closely, if patient nausea vomiting get worse, will consult surgeon -If patient has diarrhea again,will check C. difficile  Depression with anxiety -Continue home medications  SIRS (systemic inflammatory response syndrome) [facility]): Patient meets criteria for SIRS with leukocytosis and tachycardia.  Lactic acid 2.0, 0.9.  Patient also had hypotension, which is likely due to dehydration.  No source of infection identified.  No fever.  Will not treat as sepsis. -IV fluid as above  Hypotension: Initial blood pressure 74/44, which improved to 96/48.  Possibly due to  dehydration -IV fluid as above  Dehydration -IV fluid as above  AKI (acute kidney injury) ([place]): Most likely due to dehydration. -Avoid using renal toxic medications -Hold Hyzaar -IVF as above  Hypertension: -Hold all blood pressure medications due to hypotension         DVT ppx: SQ Heparin   Code Status: Full code Family Communication:   Yes, patient's husband at bed side Disposition Plan:  Anticipate discharge back to previous environment Consults called:  none Admission status: Med-surg bed for obs   Status is: Observation  The patient remains OBS appropriate and will d/c before 2 midnights.  Dispo: The patient is from: Home              Anticipated d/c is to: home              Anticipated d/c date is: 1 day              Patient currently is not medically stable to d/c.          Date of Service 03/23/2020    Ivor Costa [facility]   If 7PM-7AM, please contact night-coverage [email or web address] 03/23/2020, 3:12 PM

## 2020-03-23 NOTE — ED Notes (Signed)
Date and time results received: 03/23/20 1400 (use smartphrase ".now" to insert current time)  Test: Lactic Acid Critical Value: 2.0  Name of Provider Notified: Quentin Cornwall, MD  Orders Received? Or Actions Taken?: Orders Received - See Orders for details

## 2020-03-24 DIAGNOSIS — Z87891 Personal history of nicotine dependence: Secondary | ICD-10-CM | POA: Diagnosis not present

## 2020-03-24 DIAGNOSIS — Z20822 Contact with and (suspected) exposure to covid-19: Secondary | ICD-10-CM | POA: Diagnosis present

## 2020-03-24 DIAGNOSIS — R197 Diarrhea, unspecified: Secondary | ICD-10-CM | POA: Diagnosis present

## 2020-03-24 DIAGNOSIS — F419 Anxiety disorder, unspecified: Secondary | ICD-10-CM | POA: Diagnosis present

## 2020-03-24 DIAGNOSIS — R112 Nausea with vomiting, unspecified: Secondary | ICD-10-CM | POA: Diagnosis present

## 2020-03-24 DIAGNOSIS — I9589 Other hypotension: Secondary | ICD-10-CM | POA: Diagnosis not present

## 2020-03-24 DIAGNOSIS — I10 Essential (primary) hypertension: Secondary | ICD-10-CM | POA: Diagnosis present

## 2020-03-24 DIAGNOSIS — E876 Hypokalemia: Secondary | ICD-10-CM | POA: Diagnosis present

## 2020-03-24 DIAGNOSIS — R109 Unspecified abdominal pain: Secondary | ICD-10-CM | POA: Diagnosis present

## 2020-03-24 DIAGNOSIS — E860 Dehydration: Secondary | ICD-10-CM | POA: Diagnosis present

## 2020-03-24 DIAGNOSIS — E861 Hypovolemia: Secondary | ICD-10-CM | POA: Diagnosis not present

## 2020-03-24 DIAGNOSIS — N179 Acute kidney failure, unspecified: Secondary | ICD-10-CM | POA: Diagnosis present

## 2020-03-24 DIAGNOSIS — F32A Depression, unspecified: Secondary | ICD-10-CM | POA: Diagnosis present

## 2020-03-24 DIAGNOSIS — R6510 Systemic inflammatory response syndrome (SIRS) of non-infectious origin without acute organ dysfunction: Secondary | ICD-10-CM | POA: Diagnosis present

## 2020-03-24 DIAGNOSIS — I959 Hypotension, unspecified: Secondary | ICD-10-CM | POA: Diagnosis present

## 2020-03-24 LAB — BASIC METABOLIC PANEL
Anion gap: 8 (ref 5–15)
BUN: 27 mg/dL — ABNORMAL HIGH (ref 8–23)
CO2: 26 mmol/L (ref 22–32)
Calcium: 8.3 mg/dL — ABNORMAL LOW (ref 8.9–10.3)
Chloride: 104 mmol/L (ref 98–111)
Creatinine, Ser: 1.21 mg/dL — ABNORMAL HIGH (ref 0.44–1.00)
GFR, Estimated: 48 mL/min — ABNORMAL LOW (ref >60)
Glucose, Bld: 78 mg/dL (ref 70–99)
Potassium: 3.5 mmol/L (ref 3.5–5.1)
Sodium: 138 mmol/L (ref 135–145)

## 2020-03-24 LAB — CBC
HCT: 32.3 % — ABNORMAL LOW (ref 36.0–46.0)
Hemoglobin: 10.7 g/dL — ABNORMAL LOW (ref 12.0–15.0)
MCH: 31.5 pg (ref 26.0–34.0)
MCHC: 33.1 g/dL (ref 30.0–36.0)
MCV: 95 fL (ref 80.0–100.0)
Platelets: 173 10*3/uL (ref 150–400)
RBC: 3.4 MIL/uL — ABNORMAL LOW (ref 3.87–5.11)
RDW: 12.6 % (ref 11.5–15.5)
WBC: 6.4 10*3/uL (ref 4.0–10.5)
nRBC: 0 % (ref 0.0–0.2)

## 2020-03-24 LAB — TYPE AND SCREEN
ABO/RH(D): A POS
Antibody Screen: NEGATIVE

## 2020-03-24 MED ORDER — SODIUM CHLORIDE 0.9 % IV SOLN: INTRAVENOUS | Status: DC

## 2020-03-24 MED ORDER — OLANZAPINE 5 MG PO TABS
20.0000 mg | ORAL_TABLET | Freq: Every day | ORAL | Status: DC
  Administered 2020-03-24 – 2020-03-25 (×2): 20 mg via ORAL
  Filled 2020-03-24 (×2): qty 4

## 2020-03-24 MED ORDER — OLANZAPINE 5 MG PO TABS
10.0000 mg | ORAL_TABLET | Freq: Every morning | ORAL | Status: DC
  Administered 2020-03-25 – 2020-03-26 (×2): 10 mg via ORAL
  Filled 2020-03-24 (×2): qty 2

## 2020-03-24 NOTE — ED Notes (Signed)
Patient ambulating to restroom without assistance.

## 2020-03-24 NOTE — Progress Notes (Signed)
Patient had one large loose stool in which we were not able to get a sample . Informed patient that we would need to get a sample from the next BM if possible.

## 2020-03-24 NOTE — Progress Notes (Signed)
PROGRESS NOTE    Carmen [name]  [id number]:[id number] DOB: 02-Jul-1950 DOA: 03/23/2020 PCP: Idelle Crouch, MD  Assessment & Plan:   Principal Problem:   Intractable nausea and vomiting Active Problems:   Abdominal pain   Depression with anxiety   SIRS (systemic inflammatory response syndrome) (HCC)   Hypotension   Dehydration   AKI (acute kidney injury) ([place])   Intractable nausea and vomiting: etiology is unclear. CT scan showed possible ileus/enteritis versus developing small bowel obstruction. Will hold off on NG tube. IV zofran prn nausea/vomiting. Continue on IVFs. Advanced to clear liquid diet for lunch and will advance to soft diet for diet if tolerated lunch   Depression: severity unknown. Continue on home dose of sertraline   SIRS: meets criteria w/ leukocytosis, tachycardia, elevated lactic acid, hypotension. No source of infection identified. Will not treat as sepsis. Continue on IVFs  Hypotension: possibly secondary to dehydration. Improving slowly. Continue on IVFs   AKI: likely secondary to dehydration. Continue on IVFs. Cr is trending down from day prior. Continue to hold dose of hyzaar  HTN: continue to hold all home HTN meds secondary to hypotension    DVT prophylaxis: heparin  Code Status: full  Family Communication: discussed w/ pt's family at bedside and answered their questions Disposition Plan: likely d/c back home tomorrow if pt tolerating diet and no more nausea/vomiting   Status is: Observation  The patient remains OBS appropriate and will d/c before 2 midnights.  Dispo: The patient is from: Home              Anticipated d/c is to: Home              Anticipated d/c date is: 1 day              Patient currently is not medically stable to d/c.     Consultants:      Procedures:    Antimicrobials:    Subjective: Pt c/o being hungry  Objective: Vitals:   03/23/20 2307 03/24/20 0102 03/24/20 0405 03/24/20 0530  BP: (!) 104/51  (!) 108/57 (!) 94/50 (!) 112/47  Pulse: 65 66 66 65  Resp: 18 16 15 19   Temp:      TempSrc:      SpO2: 96% 95% 94% 93%  Weight:      Height:        Intake/Output Summary (Last 24 hours) at 03/24/2020 0821 Last data filed at 03/23/2020 1543 Gross per 24 hour  Intake 1998.92 ml  Output --  Net 1998.92 ml   Filed Weights   03/23/20 1102  Weight: 73.5 kg    Examination:  General exam: Appears frustrated Respiratory system: Clear to auscultation. Respiratory effort normal. Cardiovascular system: S1 & S2+ . No rubs, gallops or clicks. No pedal edema. Gastrointestinal system: Abdomen is nondistended, soft and nontender. Normal bowel sounds heard. Central nervous system: Alert and oriented. Moves all 4 extremited Psychiatry: Judgement and insight appear normal. Frustrated     Data Reviewed: I have personally reviewed following labs and imaging studies  CBC: Recent Labs  Lab 03/23/20 1112 03/24/20 0503  WBC 15.0* 6.4  HGB 14.8 10.7*  HCT 43.6 32.3*  MCV 92.0 95.0  PLT 331 782   Basic Metabolic Panel: Recent Labs  Lab 03/23/20 1112 03/24/20 0503  NA 137 138  K 4.2 3.5  CL 96* 104  CO2 26 26  GLUCOSE 115* 78  BUN 33* 27*  CREATININE 1.85* 1.21*  CALCIUM 10.4* 8.3*  GFR: Estimated Creatinine Clearance: 40.6 mL/min (A) (by C-G formula based on SCr of 1.21 mg/dL (H)). Liver Function Tests: Recent Labs  Lab 03/23/20 1112  AST 17  ALT 13  ALKPHOS 89  BILITOT 0.9  PROT 8.5*  ALBUMIN 4.6   Recent Labs  Lab 03/23/20 1112  LIPASE 35   No results for input(s): AMMONIA in the last 168 hours. Coagulation Profile: Recent Labs  Lab 03/23/20 1230  INR 1.0   Cardiac Enzymes: No results for input(s): CKTOTAL, CKMB, CKMBINDEX, TROPONINI in the last 168 hours. BNP (last 3 results) No results for input(s): PROBNP in the last 8760 hours. HbA1C: No results for input(s): HGBA1C in the last 72 hours. CBG: No results for input(s): GLUCAP in the last 168  hours. Lipid Profile: No results for input(s): CHOL, HDL, LDLCALC, TRIG, CHOLHDL, LDLDIRECT in the last 72 hours. Thyroid Function Tests: No results for input(s): TSH, T4TOTAL, FREET4, T3FREE, THYROIDAB in the last 72 hours. Anemia Panel: No results for input(s): VITAMINB12, FOLATE, FERRITIN, TIBC, IRON, RETICCTPCT in the last 72 hours. Sepsis Labs: Recent Labs  Lab 03/23/20 1112 03/23/20 1230  LATICACIDVEN 2.0* 0.9    Recent Results (from the past 240 hour(s))  Blood Culture (routine x 2)     Status: None (Preliminary result)   Collection Time: 03/23/20 12:22 PM   Specimen: BLOOD  Result Value Ref Range Status   Specimen Description BLOOD BLOOD LEFT FOREARM  Final   Special Requests   Final    BOTTLES DRAWN AEROBIC AND ANAEROBIC Blood Culture results may not be optimal due to an inadequate volume of blood received in culture bottles   Culture   Final    NO GROWTH < 24 HOURS Performed at [facility], [address], [place], [place] [address]    Report Status PENDING  Incomplete  Blood Culture (routine x 2)     Status: None (Preliminary result)   Collection Time: 03/23/20 12:30 PM   Specimen: BLOOD  Result Value Ref Range Status   Specimen Description BLOOD RIGHT ANTECUBITAL  Final   Special Requests   Final    BOTTLES DRAWN AEROBIC AND ANAEROBIC Blood Culture adequate volume   Culture   Final    NO GROWTH < 24 HOURS Performed at [facility], [address], [place], [place] [address]    Report Status PENDING  Incomplete  Resp Panel by RT-PCR (Flu A&B, Covid) Nasopharyngeal Swab     Status: None   Collection Time: 03/23/20  1:26 PM   Specimen: Nasopharyngeal Swab; Nasopharyngeal(NP) swabs in vial transport medium  Result Value Ref Range Status   SARS Coronavirus 2 by RT PCR NEGATIVE NEGATIVE Final    Comment: (NOTE) SARS-CoV-2 target nucleic acids are NOT DETECTED.  The SARS-CoV-2 RNA is generally detectable in upper respiratory specimens during  the acute phase of infection. The lowest concentration of SARS-CoV-2 viral copies this assay can detect is 138 copies/mL. A negative result does not preclude SARS-Cov-2 infection and should not be used as the sole basis for treatment or other patient management decisions. A negative result may occur with  improper specimen collection/handling, submission of specimen other than nasopharyngeal swab, presence of viral mutation(s) within the areas targeted by this assay, and inadequate number of viral copies(<138 copies/mL). A negative result must be combined with clinical observations, patient history, and epidemiological information. The expected result is Negative.  Fact Sheet for Patients:  EntrepreneurPulse.com.au  Fact Sheet for Healthcare Providers:  IncredibleEmployment.be  This test  is no t yet approved or cleared by the [place] and  has been authorized for detection and/or diagnosis of SARS-CoV-2 by FDA under an Emergency Use Authorization (EUA). This EUA will remain  in effect (meaning this test can be used) for the duration of the COVID-19 declaration under Section 564(b)(1) of the Act, 21 U.S.C.section 360bbb-3(b)(1), unless the authorization is terminated  or revoked sooner.       Influenza A by PCR NEGATIVE NEGATIVE Final   Influenza B by PCR NEGATIVE NEGATIVE Final    Comment: (NOTE) The Xpert Xpress SARS-CoV-2/FLU/RSV plus assay is intended as an aid in the diagnosis of influenza from Nasopharyngeal swab specimens and should not be used as a sole basis for treatment. Nasal washings and aspirates are unacceptable for Xpert Xpress SARS-CoV-2/FLU/RSV testing.  Fact Sheet for Patients: EntrepreneurPulse.com.au  Fact Sheet for Healthcare Providers: IncredibleEmployment.be  This test is not yet approved or cleared by the [place] FDA and has been authorized for detection and/or  diagnosis of SARS-CoV-2 by FDA under an Emergency Use Authorization (EUA). This EUA will remain in effect (meaning this test can be used) for the duration of the COVID-19 declaration under Section 564(b)(1) of the Act, 21 U.S.C. section 360bbb-3(b)(1), unless the authorization is terminated or revoked.  Performed at [facility], [address] [place], [place] [address]          Radiology Studies: CT ABDOMEN PELVIS WO CONTRAST  Result Date: 03/23/2020 CLINICAL DATA:  Epigastric pain with nausea, vomiting, diarrhea EXAM: CT ABDOMEN AND PELVIS WITHOUT CONTRAST TECHNIQUE: Multidetector CT imaging of the abdomen and pelvis was performed following the standard protocol without IV contrast. COMPARISON:  11/09/2010 FINDINGS: Lower chest: No acute abnormality. Hepatobiliary: Unremarkable unenhanced appearance of the liver. No focal liver lesion identified. Gallbladder within normal limits. No hyperdense gallstone. No biliary dilatation. Pancreas: Unremarkable. No pancreatic ductal dilatation or surrounding inflammatory changes. Spleen: Normal in size without focal abnormality. Adrenals/Urinary Tract: Unremarkable adrenal glands. Kidneys within normal limits without focal lesion, stone, or hydronephrosis. Ureters are nondilated. Urinary bladder appears unremarkable. Stomach/Bowel: Mildly dilated fluid-filled loops of small bowel within the mid to lower abdomen with fecalization of small bowel content distally. Small bowel measures up to 3.4 cm in diameter. No clearly defined transition point. A normal appendix is present in the right lower quadrant (series 2, image 48). Colon is within normal limits. No diverticular disease, focal bowel wall thickening, or inflammatory process. Vascular/Lymphatic: Scattered aortoiliac atherosclerotic calcifications without aneurysm. No abdominopelvic lymphadenopathy. Reproductive: Status post hysterectomy. No adnexal masses. Other: No free fluid. No  abdominopelvic fluid collection. No pneumoperitoneum. No abdominal wall hernia. Musculoskeletal: Chronic compression fracture of the L1 vertebral bodies status post cement augmentation. Mild thoracolumbar spondylosis. Degenerative changes of the bilateral hips. Acute osseous findings. IMPRESSION: 1. Mildly dilated fluid-filled loops of small bowel within the mid to lower abdomen with fecalization of small bowel content. No clearly defined transition point. Findings may represent an ileus/enteritis versus developing small bowel obstruction. 2. No evidence of acute inflammatory process of the colon. Normal appendix. 3. Chronic compression fracture of the L1 vertebral bodies status post cement augmentation. 4. Aortic atherosclerosis. (ICD10-I70.0). Electronically Signed   By: Davina Poke D.O.   On: 03/23/2020 12:58        Scheduled Meds: . ARIPiprazole  2 mg Oral Daily  . clonazePAM  0.25 mg Oral BID  . heparin  5,000 Units Subcutaneous Q8H  . OLANZapine  10-20 mg Oral See admin instructions  .  sertraline  200 mg Oral Daily   Continuous Infusions: . lactated ringers 125 mL/hr at 03/23/20 1438     LOS: 0 days    Time spent: 35 mins     Wyvonnia Dusky, MD [facility] Pager [phone]  If 7PM-7AM, please contact night-coverage 03/24/2020, 8:21 AM

## 2020-03-24 NOTE — Progress Notes (Signed)
Received report from Patrice, RN. Assuming care of patient at this time. 

## 2020-03-24 NOTE — Plan of Care (Signed)

## 2020-03-24 NOTE — ED Notes (Signed)
Dietary contacted to have a clear liquid tray sent for pt.

## 2020-03-25 DIAGNOSIS — I9589 Other hypotension: Secondary | ICD-10-CM | POA: Diagnosis not present

## 2020-03-25 DIAGNOSIS — R112 Nausea with vomiting, unspecified: Secondary | ICD-10-CM | POA: Diagnosis not present

## 2020-03-25 DIAGNOSIS — E861 Hypovolemia: Secondary | ICD-10-CM

## 2020-03-25 DIAGNOSIS — R197 Diarrhea, unspecified: Secondary | ICD-10-CM | POA: Diagnosis present

## 2020-03-25 DIAGNOSIS — E860 Dehydration: Secondary | ICD-10-CM | POA: Diagnosis not present

## 2020-03-25 LAB — GASTROINTESTINAL PANEL BY PCR, STOOL (REPLACES STOOL CULTURE)

## 2020-03-25 LAB — CBC
HCT: 35 % — ABNORMAL LOW (ref 36.0–46.0)
Hemoglobin: 11.6 g/dL — ABNORMAL LOW (ref 12.0–15.0)
MCH: 30.8 pg (ref 26.0–34.0)
MCHC: 33.1 g/dL (ref 30.0–36.0)
MCV: 92.8 fL (ref 80.0–100.0)
Platelets: 224 10*3/uL (ref 150–400)
RBC: 3.77 MIL/uL — ABNORMAL LOW (ref 3.87–5.11)
RDW: 12.3 % (ref 11.5–15.5)
WBC: 8.1 10*3/uL (ref 4.0–10.5)
nRBC: 0 % (ref 0.0–0.2)

## 2020-03-25 LAB — BASIC METABOLIC PANEL
Anion gap: 11 (ref 5–15)
BUN: 17 mg/dL (ref 8–23)
CO2: 30 mmol/L (ref 22–32)
Calcium: 9.2 mg/dL (ref 8.9–10.3)
Chloride: 99 mmol/L (ref 98–111)
Creatinine, Ser: 1.05 mg/dL — ABNORMAL HIGH (ref 0.44–1.00)
GFR, Estimated: 57 mL/min — ABNORMAL LOW (ref >60)
Glucose, Bld: 116 mg/dL — ABNORMAL HIGH (ref 70–99)
Potassium: 3 mmol/L — ABNORMAL LOW (ref 3.5–5.1)
Sodium: 140 mmol/L (ref 135–145)

## 2020-03-25 LAB — URINE CULTURE

## 2020-03-25 LAB — MAGNESIUM: Magnesium: 1.7 mg/dL (ref 1.7–2.4)

## 2020-03-25 LAB — PHOSPHORUS: Phosphorus: 3 mg/dL (ref 2.5–4.6)

## 2020-03-25 LAB — GLUCOSE, CAPILLARY: Glucose-Capillary: 116 mg/dL — ABNORMAL HIGH (ref 70–99)

## 2020-03-25 LAB — C DIFFICILE QUICK SCREEN W PCR REFLEX
C Diff antigen: NEGATIVE
C Diff interpretation: NOT DETECTED
C Diff toxin: NEGATIVE

## 2020-03-25 MED ORDER — LOPERAMIDE HCL 2 MG PO CAPS
2.0000 mg | ORAL_CAPSULE | Freq: Three times a day (TID) | ORAL | Status: AC
  Administered 2020-03-25 (×3): 2 mg via ORAL
  Filled 2020-03-25 (×3): qty 1

## 2020-03-25 MED ORDER — MAGNESIUM SULFATE 2 GM/50ML IV SOLN
2.0000 g | Freq: Once | INTRAVENOUS | Status: AC
  Administered 2020-03-25: 2 g via INTRAVENOUS
  Filled 2020-03-25: qty 50

## 2020-03-25 MED ORDER — POTASSIUM CHLORIDE IN NACL 40-0.9 MEQ/L-% IV SOLN
INTRAVENOUS | Status: DC
  Filled 2020-03-25 (×4): qty 1000

## 2020-03-25 MED ORDER — SODIUM CHLORIDE 0.9 % IV BOLUS
250.0000 mL | Freq: Once | INTRAVENOUS | Status: AC
  Administered 2020-03-25: 250 mL via INTRAVENOUS

## 2020-03-25 NOTE — Progress Notes (Signed)
Received report from Chris, RN. Assuming care of patient at this time.  

## 2020-03-25 NOTE — Progress Notes (Addendum)
Progress Note    Carmen [name]  TGG:[id number] DOB: Feb 18, 1951  DOA: 03/23/2020 PCP: Idelle Crouch, MD      Brief Narrative:    Medical records reviewed and are as summarized below:  Carmen [name] is a 69 y.o. female       Assessment/Plan:   Principal Problem:   Intractable nausea and vomiting Active Problems:   Abdominal pain   Depression with anxiety   SIRS (systemic inflammatory response syndrome) ([place])   Hypotension   Dehydration   AKI (acute kidney injury) ([place])   Diarrhea    Body mass index is 29.63 kg/m.    Intractable nausea, vomiting and diarrhea: Nausea and vomiting have improved but she continues to have diarrhea.  Continue IV fluids for hydration.  Start Imodium for diarrhea.  She wants to try a regular diet.  Stool for C. difficile toxin and GI panel was negative.  AKI: Creatinine is improving.  Continue IV fluids and monitor BMP.  Hypokalemia: Replace potassium intravenously.  Magnesium level is at the lower limit of normal.  Replete magnesium as well.  Hypotension: Antihypertensives are still on hold (losartan-HCTZ, propranolol, verapamil, Toprol-XL,  Depression and anxiety: Continue psychotropics and clonazepam.    Diet Order            Diet Heart Room service appropriate? Yes; Fluid consistency: Thin  Diet effective now                    Consultants:  None  Procedures:  None    Medications:   . ARIPiprazole  2 mg Oral Daily  . clonazePAM  0.25 mg Oral BID  . heparin  5,000 Units Subcutaneous Q8H  . loperamide  2 mg Oral TID  . OLANZapine  10 mg Oral q morning - 10a  . OLANZapine  20 mg Oral QHS  . sertraline  200 mg Oral Daily   Continuous Infusions: . 0.9 % NaCl with KCl 40 mEq / L 75 mL/hr at 03/25/20 7035     Anti-infectives (From admission, onward)   None             Family Communication/Anticipated D/C date and plan/Code Status   DVT prophylaxis: heparin injection 5,000  Units Start: 03/23/20 1530     Code Status: Full Code  Family Communication: Plan discussed with her husband at the bedside Disposition Plan:    Status is: Inpatient  Remains inpatient appropriate because:Persistent severe electrolyte disturbances and IV treatments appropriate due to intensity of illness or inability to take PO   Dispo: The patient is from: Home              Anticipated d/c is to: Home              Anticipated d/c date is: 1 day              Patient currently is not medically stable to d/c.           Subjective:   C/o watery diarrhea.  She said she had about 12 stools overnight.  Stools are nonbloody.  No vomiting, abdominal pain or fever.  Objective:    Vitals:   03/25/20 0458 03/25/20 0732 03/25/20 0738 03/25/20 1220  BP: (!) 144/70 (!) 148/68 (!) 126/58 138/74  Pulse: (!) 106 (!) 107 68 (!) 107  Resp: 16 17 16 16   Temp: 97.6 F (36.4 C) 98.1 F (36.7 C) 98.6 F (37 C)   TempSrc: Oral  SpO2: 94% 95% 93% 95%  Weight:      Height:       No data found.   Intake/Output Summary (Last 24 hours) at 03/25/2020 1425 Last data filed at 03/25/2020 0300 Gross per 24 hour  Intake 772.36 ml  Output --  Net 772.36 ml   Filed Weights   03/23/20 1102  Weight: 73.5 kg    Exam:  GEN: NAD SKIN: Warm and dry EYES: No pallor or icterus ENT: MMM CV: RRR PULM: CTA B ABD: soft, ND, NT, +BS CNS: AAO x 3, non focal EXT: No edema or tenderness   Data Reviewed:   I have personally reviewed following labs and imaging studies:  Labs: Labs show the following:   Basic Metabolic Panel: Recent Labs  Lab 03/23/20 1112 03/23/20 1112 03/24/20 0503 03/25/20 0428  NA 137  --  138 140  K 4.2   < > 3.5 3.0*  CL 96*  --  104 99  CO2 26  --  26 30  GLUCOSE 115*  --  78 116*  BUN 33*  --  27* 17  CREATININE 1.85*  --  1.21* 1.05*  CALCIUM 10.4*  --  8.3* 9.2  MG  --   --   --  1.7  PHOS  --   --   --  3.0   < > = values in this interval not  displayed.   GFR Estimated Creatinine Clearance: 46.8 mL/min (A) (by C-G formula based on SCr of 1.05 mg/dL (H)). Liver Function Tests: Recent Labs  Lab 03/23/20 1112  AST 17  ALT 13  ALKPHOS 89  BILITOT 0.9  PROT 8.5*  ALBUMIN 4.6   Recent Labs  Lab 03/23/20 1112  LIPASE 35   No results for input(s): AMMONIA in the last 168 hours. Coagulation profile Recent Labs  Lab 03/23/20 1230  INR 1.0    CBC: Recent Labs  Lab 03/23/20 1112 03/24/20 0503 03/25/20 0428  WBC 15.0* 6.4 8.1  HGB 14.8 10.7* 11.6*  HCT 43.6 32.3* 35.0*  MCV 92.0 95.0 92.8  PLT 331 173 224   Cardiac Enzymes: No results for input(s): CKTOTAL, CKMB, CKMBINDEX, TROPONINI in the last 168 hours. BNP (last 3 results) No results for input(s): PROBNP in the last 8760 hours. CBG: Recent Labs  Lab 03/25/20 0735  GLUCAP 116*   D-Dimer: No results for input(s): DDIMER in the last 72 hours. Hgb A1c: No results for input(s): HGBA1C in the last 72 hours. Lipid Profile: No results for input(s): CHOL, HDL, LDLCALC, TRIG, CHOLHDL, LDLDIRECT in the last 72 hours. Thyroid function studies: No results for input(s): TSH, T4TOTAL, T3FREE, THYROIDAB in the last 72 hours.  Invalid input(s): FREET3 Anemia work up: No results for input(s): VITAMINB12, FOLATE, FERRITIN, TIBC, IRON, RETICCTPCT in the last 72 hours. Sepsis Labs: Recent Labs  Lab 03/23/20 1112 03/23/20 1230 03/24/20 0503 03/25/20 0428  WBC 15.0*  --  6.4 8.1  LATICACIDVEN 2.0* 0.9  --   --     Microbiology Recent Results (from the past 240 hour(s))  Blood Culture (routine x 2)     Status: None (Preliminary result)   Collection Time: 03/23/20 12:22 PM   Specimen: BLOOD  Result Value Ref Range Status   Specimen Description BLOOD BLOOD LEFT FOREARM  Final   Special Requests   Final    BOTTLES DRAWN AEROBIC AND ANAEROBIC Blood Culture results may not be optimal due to an inadequate volume of blood received in culture bottles  Culture    Final    NO GROWTH 2 DAYS Performed at [facility], [address] [place], [place] [address]    Report Status PENDING  Incomplete  Blood Culture (routine x 2)     Status: None (Preliminary result)   Collection Time: 03/23/20 12:30 PM   Specimen: BLOOD  Result Value Ref Range Status   Specimen Description BLOOD RIGHT ANTECUBITAL  Final   Special Requests   Final    BOTTLES DRAWN AEROBIC AND ANAEROBIC Blood Culture adequate volume   Culture   Final    NO GROWTH 2 DAYS Performed at [facility], [address] [place], [place] [address]    Report Status PENDING  Incomplete  Resp Panel by RT-PCR (Flu A&B, Covid) Nasopharyngeal Swab     Status: None   Collection Time: 03/23/20  1:26 PM   Specimen: Nasopharyngeal Swab; Nasopharyngeal(NP) swabs in vial transport medium  Result Value Ref Range Status   SARS Coronavirus 2 by RT PCR NEGATIVE NEGATIVE Final    Comment: (NOTE) SARS-CoV-2 target nucleic acids are NOT DETECTED.  The SARS-CoV-2 RNA is generally detectable in upper respiratory specimens during the acute phase of infection. The lowest concentration of SARS-CoV-2 viral copies this assay can detect is 138 copies/mL. A negative result does not preclude SARS-Cov-2 infection and should not be used as the sole basis for treatment or other patient management decisions. A negative result may occur with  improper specimen collection/handling, submission of specimen other than nasopharyngeal swab, presence of viral mutation(s) within the areas targeted by this assay, and inadequate number of viral copies(<138 copies/mL). A negative result must be combined with clinical observations, patient history, and epidemiological information. The expected result is Negative.  Fact Sheet for Patients:  EntrepreneurPulse.com.au  Fact Sheet for Healthcare Providers:  IncredibleEmployment.be  This test is no t yet approved or cleared by  the [place] FDA and  has been authorized for detection and/or diagnosis of SARS-CoV-2 by FDA under an Emergency Use Authorization (EUA). This EUA will remain  in effect (meaning this test can be used) for the duration of the COVID-19 declaration under Section 564(b)(1) of the Act, 21 U.S.C.section 360bbb-3(b)(1), unless the authorization is terminated  or revoked sooner.       Influenza A by PCR NEGATIVE NEGATIVE Final   Influenza B by PCR NEGATIVE NEGATIVE Final    Comment: (NOTE) The Xpert Xpress SARS-CoV-2/FLU/RSV plus assay is intended as an aid in the diagnosis of influenza from Nasopharyngeal swab specimens and should not be used as a sole basis for treatment. Nasal washings and aspirates are unacceptable for Xpert Xpress SARS-CoV-2/FLU/RSV testing.  Fact Sheet for Patients: EntrepreneurPulse.com.au  Fact Sheet for Healthcare Providers: IncredibleEmployment.be  This test is not yet approved or cleared by the [place] FDA and has been authorized for detection and/or diagnosis of SARS-CoV-2 by FDA under an Emergency Use Authorization (EUA). This EUA will remain in effect (meaning this test can be used) for the duration of the COVID-19 declaration under Section 564(b)(1) of the Act, 21 U.S.C. section 360bbb-3(b)(1), unless the authorization is terminated or revoked.  Performed at [facility], [address] [place], [place] [address]   Urine culture     Status: Abnormal   Collection Time: 03/23/20  3:38 PM   Specimen: In/Out Cath Urine  Result Value Ref Range Status   Specimen Description   Final    IN/OUT CATH URINE Performed at [facility], [address],  [place], [place] [address]    Special Requests   Final    NONE Performed at [facility], [address] [place], [place] [address]    Culture MULTIPLE SPECIES PRESENT, SUGGEST RECOLLECTION (A)  Final   Report Status 03/25/2020  FINAL  Final  Gastrointestinal Panel by PCR , Stool     Status: None   Collection Time: 03/23/20 11:00 PM   Specimen: Stool  Result Value Ref Range Status   Campylobacter species NOT DETECTED NOT DETECTED Final   Plesimonas shigelloides NOT DETECTED NOT DETECTED Final   Salmonella species NOT DETECTED NOT DETECTED Final   Yersinia enterocolitica NOT DETECTED NOT DETECTED Final   Vibrio species NOT DETECTED NOT DETECTED Final   Vibrio cholerae NOT DETECTED NOT DETECTED Final   Enteroaggregative E coli (EAEC) NOT DETECTED NOT DETECTED Final   Enteropathogenic E coli (EPEC) NOT DETECTED NOT DETECTED Final   Enterotoxigenic E coli (ETEC) NOT DETECTED NOT DETECTED Final   Shiga like toxin producing E coli (STEC) NOT DETECTED NOT DETECTED Final   Shigella/Enteroinvasive E coli (EIEC) NOT DETECTED NOT DETECTED Final   Cryptosporidium NOT DETECTED NOT DETECTED Final   Cyclospora cayetanensis NOT DETECTED NOT DETECTED Final   Entamoeba histolytica NOT DETECTED NOT DETECTED Final   Giardia lamblia NOT DETECTED NOT DETECTED Final   Adenovirus F40/41 NOT DETECTED NOT DETECTED Final   Astrovirus NOT DETECTED NOT DETECTED Final   Norovirus GI/GII NOT DETECTED NOT DETECTED Final   Rotavirus A NOT DETECTED NOT DETECTED Final   Sapovirus (I, II, IV, and V) NOT DETECTED NOT DETECTED Final    Comment: Performed at [facility], [address] [place], [place] [address]  C Difficile Quick Screen w PCR reflex     Status: None   Collection Time: 03/23/20 11:00 PM   Specimen: STOOL  Result Value Ref Range Status   C Diff antigen NEGATIVE NEGATIVE Final   C Diff toxin NEGATIVE NEGATIVE Final   C Diff interpretation No C. difficile detected.  Final    Comment: Performed at [facility] [address] [place], [place] [address]    Procedures and diagnostic studies:  No results found.             LOS: 1 day   Vaneza Pickart  [facility]   Pager on  [email or web address]. If 7PM-7AM, please contact night-coverage at [email or web address]     03/25/2020, 2:25 PM

## 2020-03-26 LAB — CBC WITH DIFFERENTIAL/PLATELET
Abs Immature Granulocytes: 0.02 10*3/uL (ref 0.00–0.07)
Basophils Absolute: 0 10*3/uL (ref 0.0–0.1)
Basophils Relative: 1 %
Eosinophils Absolute: 0.3 10*3/uL (ref 0.0–0.5)
Eosinophils Relative: 5 %
HCT: 32 % — ABNORMAL LOW (ref 36.0–46.0)
Hemoglobin: 10.6 g/dL — ABNORMAL LOW (ref 12.0–15.0)
Immature Granulocytes: 0 %
Lymphocytes Relative: 38 %
Lymphs Abs: 2.3 10*3/uL (ref 0.7–4.0)
MCH: 31.4 pg (ref 26.0–34.0)
MCHC: 33.1 g/dL (ref 30.0–36.0)
MCV: 94.7 fL (ref 80.0–100.0)
Monocytes Absolute: 0.5 10*3/uL (ref 0.1–1.0)
Monocytes Relative: 9 %
Neutro Abs: 2.9 10*3/uL (ref 1.7–7.7)
Neutrophils Relative %: 47 %
Platelets: 188 10*3/uL (ref 150–400)
RBC: 3.38 MIL/uL — ABNORMAL LOW (ref 3.87–5.11)
RDW: 12.6 % (ref 11.5–15.5)
WBC: 6.1 10*3/uL (ref 4.0–10.5)
nRBC: 0 % (ref 0.0–0.2)

## 2020-03-26 LAB — BASIC METABOLIC PANEL
Anion gap: 8 (ref 5–15)
BUN: 11 mg/dL (ref 8–23)
CO2: 25 mmol/L (ref 22–32)
Calcium: 8.5 mg/dL — ABNORMAL LOW (ref 8.9–10.3)
Chloride: 109 mmol/L (ref 98–111)
Creatinine, Ser: 0.86 mg/dL (ref 0.44–1.00)
GFR, Estimated: >60 mL/min (ref >60)
Glucose, Bld: 87 mg/dL (ref 70–99)
Potassium: 3.5 mmol/L (ref 3.5–5.1)
Sodium: 142 mmol/L (ref 135–145)

## 2020-03-26 LAB — MAGNESIUM: Magnesium: 2.1 mg/dL (ref 1.7–2.4)

## 2020-03-26 MED ORDER — LOPERAMIDE HCL 2 MG PO CAPS
2.0000 mg | ORAL_CAPSULE | Freq: Three times a day (TID) | ORAL | Status: DC
  Administered 2020-03-26: 2 mg via ORAL
  Filled 2020-03-26: qty 1

## 2020-03-26 NOTE — Plan of Care (Signed)
  Problem: Education: Goal: Knowledge of General Education information will improve Description: Including pain rating scale, medication(s)/side effects and non-pharmacologic comfort measures Outcome: Progressing   Problem: Health Behavior/Discharge Planning: Goal: Ability to manage health-related needs will improve Outcome: Progressing   Problem: Clinical Measurements: Goal: Ability to maintain clinical measurements within normal limits will improve Outcome: Progressing Goal: Will remain free from infection Outcome: Progressing Goal: Diagnostic test results will improve Outcome: Progressing Goal: Respiratory complications will improve Outcome: Progressing Goal: Cardiovascular complication will be avoided Outcome: Progressing   Problem: Activity: Goal: Risk for activity intolerance will decrease Outcome: Progressing   Problem: Nutrition: Goal: Adequate nutrition will be maintained Outcome: Progressing   Problem: Coping: Goal: Level of anxiety will decrease Outcome: Progressing   Problem: Elimination: Goal: Will not experience complications related to bowel motility Outcome: Progressing Goal: Will not experience complications related to urinary retention Outcome: Progressing   Problem: Safety: Goal: Ability to remain free from injury will improve Outcome: Progressing   Problem: Skin Integrity: Goal: Risk for impaired skin integrity will decrease Outcome: Progressing  Pt is improved and denies any further diarrhea - states she feels ready to go home

## 2020-03-26 NOTE — Discharge Summary (Signed)
Physician Discharge Summary  Carmen [name] ION:[id number] DOB: 10/10/1950 DOA: 03/23/2020  PCP: Idelle Crouch, MD  Admit date: 03/23/2020 Discharge date: 03/26/2020  Discharge disposition: Home   Recommendations for Outpatient Follow-Up:   Follow up with PCP in 1 week   Discharge Diagnosis:   Principal Problem:   Intractable nausea and vomiting Active Problems:   Abdominal pain   Depression with anxiety   SIRS (systemic inflammatory response syndrome) (HCC)   Hypotension   Dehydration   AKI (acute kidney injury) ([place])   Diarrhea    Discharge Condition: Stable.  Diet recommendation:  Diet Order            Diet - low sodium heart healthy           Diet Heart Room service appropriate? Yes; Fluid consistency: Thin  Diet effective now                   Code Status: Full Code     Hospital Course:   Ms. Carmen [name] is a 69 y.o. female with medical history significant of hypertension, depression, anxiety, who presents with nausea vomiting, diarrhea and abdominal pain.  She was found to have acute kidney injury, hypotension and hypokalemia.  She was treated with IV fluids and Imodium.  Potassium was repleted.  Stool for C. difficile toxin and GI panel was negative.  Creatinine and potassium have normalized.  Blood pressure is better.  Diarrhea has resolved.  She feels better and she is deemed stable for discharge to home today.  Discharge plan was discussed with the patient and her husband at the bedside.    Discharge Exam:    Vitals:   03/25/20 1946 03/26/20 0016 03/26/20 0416 03/26/20 0804  BP: (!) 149/67 (!) 142/63 (!) 147/81 (!) 157/74  Pulse: 98 80 82 92  Resp:    18  Temp: 98.6 F (37 C)   (!) 97.4 F (36.3 C)  TempSrc: Oral     SpO2: 94% 96% 99% 97%  Weight:      Height:         GEN: NAD SKIN: Warm and dry EYES: No pallor or icterus ENT: MMM CV: RRR PULM: CTA B ABD: soft, ND, NT, +BS CNS: AAO x 3, non focal EXT: No  edema or tenderness   The results of significant diagnostics from this hospitalization (including imaging, microbiology, ancillary and laboratory) are listed below for reference.     Procedures and Diagnostic Studies:   CT ABDOMEN PELVIS WO CONTRAST  Result Date: 03/23/2020 CLINICAL DATA:  Epigastric pain with nausea, vomiting, diarrhea EXAM: CT ABDOMEN AND PELVIS WITHOUT CONTRAST TECHNIQUE: Multidetector CT imaging of the abdomen and pelvis was performed following the standard protocol without IV contrast. COMPARISON:  11/09/2010 FINDINGS: Lower chest: No acute abnormality. Hepatobiliary: Unremarkable unenhanced appearance of the liver. No focal liver lesion identified. Gallbladder within normal limits. No hyperdense gallstone. No biliary dilatation. Pancreas: Unremarkable. No pancreatic ductal dilatation or surrounding inflammatory changes. Spleen: Normal in size without focal abnormality. Adrenals/Urinary Tract: Unremarkable adrenal glands. Kidneys within normal limits without focal lesion, stone, or hydronephrosis. Ureters are nondilated. Urinary bladder appears unremarkable. Stomach/Bowel: Mildly dilated fluid-filled loops of small bowel within the mid to lower abdomen with fecalization of small bowel content distally. Small bowel measures up to 3.4 cm in diameter. No clearly defined transition point. A normal appendix is present in the right lower quadrant (series 2, image 48). Colon is within normal limits. No diverticular disease, focal  bowel wall thickening, or inflammatory process. Vascular/Lymphatic: Scattered aortoiliac atherosclerotic calcifications without aneurysm. No abdominopelvic lymphadenopathy. Reproductive: Status post hysterectomy. No adnexal masses. Other: No free fluid. No abdominopelvic fluid collection. No pneumoperitoneum. No abdominal wall hernia. Musculoskeletal: Chronic compression fracture of the L1 vertebral bodies status post cement augmentation. Mild thoracolumbar  spondylosis. Degenerative changes of the bilateral hips. Acute osseous findings. IMPRESSION: 1. Mildly dilated fluid-filled loops of small bowel within the mid to lower abdomen with fecalization of small bowel content. No clearly defined transition point. Findings may represent an ileus/enteritis versus developing small bowel obstruction. 2. No evidence of acute inflammatory process of the colon. Normal appendix. 3. Chronic compression fracture of the L1 vertebral bodies status post cement augmentation. 4. Aortic atherosclerosis. (ICD10-I70.0). Electronically Signed   By: Davina Poke D.O.   On: 03/23/2020 12:58     Labs:   Basic Metabolic Panel: Recent Labs  Lab 03/23/20 1112 03/24/20 0503 03/25/20 0428 03/26/20 0416  NA 137 138 140 142  K 4.2 3.5 3.0* 3.5  CL 96* 104 99 109  CO2 26 26 30 25   GLUCOSE 115* 78 116* 87  BUN 33* 27* 17 11  CREATININE 1.85* 1.21* 1.05* 0.86  CALCIUM 10.4* 8.3* 9.2 8.5*  MG  --   --  1.7 2.1  PHOS  --   --  3.0  --    GFR Estimated Creatinine Clearance: 57.2 mL/min (by C-G formula based on SCr of 0.86 mg/dL). Liver Function Tests: Recent Labs  Lab 03/23/20 1112  AST 17  ALT 13  ALKPHOS 89  BILITOT 0.9  PROT 8.5*  ALBUMIN 4.6   Recent Labs  Lab 03/23/20 1112  LIPASE 35   No results for input(s): AMMONIA in the last 168 hours. Coagulation profile Recent Labs  Lab 03/23/20 1230  INR 1.0    CBC: Recent Labs  Lab 03/23/20 1112 03/24/20 0503 03/25/20 0428 03/26/20 0416  WBC 15.0* 6.4 8.1 6.1  NEUTROABS  --   --   --  2.9  HGB 14.8 10.7* 11.6* 10.6*  HCT 43.6 32.3* 35.0* 32.0*  MCV 92.0 95.0 92.8 94.7  PLT 331 173 224 188   Cardiac Enzymes: No results for input(s): CKTOTAL, CKMB, CKMBINDEX, TROPONINI in the last 168 hours. BNP: Invalid input(s): POCBNP CBG: Recent Labs  Lab 03/25/20 0735  GLUCAP 116*   D-Dimer No results for input(s): DDIMER in the last 72 hours. Hgb A1c No results for input(s): HGBA1C in the last  72 hours. Lipid Profile No results for input(s): CHOL, HDL, LDLCALC, TRIG, CHOLHDL, LDLDIRECT in the last 72 hours. Thyroid function studies No results for input(s): TSH, T4TOTAL, T3FREE, THYROIDAB in the last 72 hours.  Invalid input(s): FREET3 Anemia work up No results for input(s): VITAMINB12, FOLATE, FERRITIN, TIBC, IRON, RETICCTPCT in the last 72 hours. Microbiology Recent Results (from the past 240 hour(s))  Blood Culture (routine x 2)     Status: None (Preliminary result)   Collection Time: 03/23/20 12:22 PM   Specimen: BLOOD  Result Value Ref Range Status   Specimen Description BLOOD BLOOD LEFT FOREARM  Final   Special Requests   Final    BOTTLES DRAWN AEROBIC AND ANAEROBIC Blood Culture results may not be optimal due to an inadequate volume of blood received in culture bottles   Culture   Final    NO GROWTH 3 DAYS Performed at [facility], [address], [place], [place] [address]    Report Status PENDING  Incomplete  Blood Culture (  routine x 2)     Status: None (Preliminary result)   Collection Time: 03/23/20 12:30 PM   Specimen: BLOOD  Result Value Ref Range Status   Specimen Description BLOOD RIGHT ANTECUBITAL  Final   Special Requests   Final    BOTTLES DRAWN AEROBIC AND ANAEROBIC Blood Culture adequate volume   Culture   Final    NO GROWTH 3 DAYS Performed at [facility], [address] [place], [place] [address]    Report Status PENDING  Incomplete  Resp Panel by RT-PCR (Flu A&B, Covid) Nasopharyngeal Swab     Status: None   Collection Time: 03/23/20  1:26 PM   Specimen: Nasopharyngeal Swab; Nasopharyngeal(NP) swabs in vial transport medium  Result Value Ref Range Status   SARS Coronavirus 2 by RT PCR NEGATIVE NEGATIVE Final    Comment: (NOTE) SARS-CoV-2 target nucleic acids are NOT DETECTED.  The SARS-CoV-2 RNA is generally detectable in upper respiratory specimens during the acute phase of infection. The lowest concentration of  SARS-CoV-2 viral copies this assay can detect is 138 copies/mL. A negative result does not preclude SARS-Cov-2 infection and should not be used as the sole basis for treatment or other patient management decisions. A negative result may occur with  improper specimen collection/handling, submission of specimen other than nasopharyngeal swab, presence of viral mutation(s) within the areas targeted by this assay, and inadequate number of viral copies(<138 copies/mL). A negative result must be combined with clinical observations, patient history, and epidemiological information. The expected result is Negative.  Fact Sheet for Patients:  EntrepreneurPulse.com.au  Fact Sheet for Healthcare Providers:  IncredibleEmployment.be  This test is no t yet approved or cleared by the [place] FDA and  has been authorized for detection and/or diagnosis of SARS-CoV-2 by FDA under an Emergency Use Authorization (EUA). This EUA will remain  in effect (meaning this test can be used) for the duration of the COVID-19 declaration under Section 564(b)(1) of the Act, 21 U.S.C.section 360bbb-3(b)(1), unless the authorization is terminated  or revoked sooner.       Influenza A by PCR NEGATIVE NEGATIVE Final   Influenza B by PCR NEGATIVE NEGATIVE Final    Comment: (NOTE) The Xpert Xpress SARS-CoV-2/FLU/RSV plus assay is intended as an aid in the diagnosis of influenza from Nasopharyngeal swab specimens and should not be used as a sole basis for treatment. Nasal washings and aspirates are unacceptable for Xpert Xpress SARS-CoV-2/FLU/RSV testing.  Fact Sheet for Patients: EntrepreneurPulse.com.au  Fact Sheet for Healthcare Providers: IncredibleEmployment.be  This test is not yet approved or cleared by the [place] FDA and has been authorized for detection and/or diagnosis of SARS-CoV-2 by FDA under an Emergency Use  Authorization (EUA). This EUA will remain in effect (meaning this test can be used) for the duration of the COVID-19 declaration under Section 564(b)(1) of the Act, 21 U.S.C. section 360bbb-3(b)(1), unless the authorization is terminated or revoked.  Performed at [facility], [address], [place], [place] [address]   Urine culture     Status: Abnormal   Collection Time: 03/23/20  3:38 PM   Specimen: In/Out Cath Urine  Result Value Ref Range Status   Specimen Description   Final    IN/OUT CATH URINE Performed at [facility], [address], [place], [place] [address]    Special Requests   Final    NONE Performed at [facility], [address], [place], [place] [address]    Culture MULTIPLE SPECIES PRESENT,  SUGGEST RECOLLECTION (A)  Final   Report Status 03/25/2020 FINAL  Final  Gastrointestinal Panel by PCR , Stool     Status: None   Collection Time: 03/23/20 11:00 PM   Specimen: Stool  Result Value Ref Range Status   Campylobacter species NOT DETECTED NOT DETECTED Final   Plesimonas shigelloides NOT DETECTED NOT DETECTED Final   Salmonella species NOT DETECTED NOT DETECTED Final   Yersinia enterocolitica NOT DETECTED NOT DETECTED Final   Vibrio species NOT DETECTED NOT DETECTED Final   Vibrio cholerae NOT DETECTED NOT DETECTED Final   Enteroaggregative E coli (EAEC) NOT DETECTED NOT DETECTED Final   Enteropathogenic E coli (EPEC) NOT DETECTED NOT DETECTED Final   Enterotoxigenic E coli (ETEC) NOT DETECTED NOT DETECTED Final   Shiga like toxin producing E coli (STEC) NOT DETECTED NOT DETECTED Final   Shigella/Enteroinvasive E coli (EIEC) NOT DETECTED NOT DETECTED Final   Cryptosporidium NOT DETECTED NOT DETECTED Final   Cyclospora cayetanensis NOT DETECTED NOT DETECTED Final   Entamoeba histolytica NOT DETECTED NOT DETECTED Final   Giardia lamblia NOT DETECTED NOT DETECTED Final   Adenovirus F40/41 NOT DETECTED NOT DETECTED Final    Astrovirus NOT DETECTED NOT DETECTED Final   Norovirus GI/GII NOT DETECTED NOT DETECTED Final   Rotavirus A NOT DETECTED NOT DETECTED Final   Sapovirus (I, II, IV, and V) NOT DETECTED NOT DETECTED Final    Comment: Performed at [facility], [address] [place], [place] [address]  C Difficile Quick Screen w PCR reflex     Status: None   Collection Time: 03/23/20 11:00 PM   Specimen: STOOL  Result Value Ref Range Status   C Diff antigen NEGATIVE NEGATIVE Final   C Diff toxin NEGATIVE NEGATIVE Final   C Diff interpretation No C. difficile detected.  Final    Comment: Performed at [facility], [address] [place], [place] [address]     Discharge Instructions:   Discharge Instructions    Diet - low sodium heart healthy   Complete by: As directed    Increase activity slowly   Complete by: As directed      Allergies as of 03/26/2020      Reactions   Prednisone Other (See Comments)   Severe depression      Medication List    STOP taking these medications   propranolol 10 MG tablet Commonly known as: INDERAL     TAKE these medications   ARIPiprazole 2 MG tablet Commonly known as: ABILIFY Take 2 mg by mouth daily.   clonazePAM 0.5 MG tablet Commonly known as: KLONOPIN Take 0.25 mg by mouth 2 (two) times daily.   Klor-Con M10 10 MEQ tablet Generic drug: potassium chloride Take 20 mEq by mouth 3 (three) times daily.   losartan-hydrochlorothiazide 100-12.5 MG tablet Commonly known as: HYZAAR Take 1 tablet by mouth daily.   metoprolol succinate 50 MG 24 hr tablet Commonly known as: TOPROL-XL Take 50 mg by mouth daily. Take with or immediately following a meal.   OLANZapine 10 MG tablet Commonly known as: ZYPREXA Take 10-20 mg by mouth See admin instructions. Take 1 tablet (10 mg) by mouth in the morning & take 2 tablets (20 mg) by mouth at bedtime.   sertraline 100 MG tablet Commonly known as: ZOLOFT Take 200 mg by mouth daily.    verapamil 240 MG CR tablet Commonly known as: CALAN-SR Take 240 mg by mouth daily.         Time  coordinating discharge: 31 minutes  Signed:  Colisha Redler  [facility] 03/26/2020, 9:43 AM   Pager on [email or web address]. If 7PM-7AM, please contact night-coverage at [email or web address]

## 2020-03-28 LAB — CULTURE, BLOOD (ROUTINE X 2)
Culture: NO GROWTH
Culture: NO GROWTH
Special Requests: ADEQUATE

## 2020-06-16 ENCOUNTER — Other Ambulatory Visit: Payer: Self-pay | Admitting: Internal Medicine

## 2020-06-16 DIAGNOSIS — Z1231 Encounter for screening mammogram for malignant neoplasm of breast: Secondary | ICD-10-CM

## 2020-08-05 ENCOUNTER — Ambulatory Visit
Admission: RE | Admit: 2020-08-05 | Discharge: 2020-08-05 | Disposition: A | Discharge status: Home / Self Care | Payer: Medicare Other | Source: Ambulatory Visit | Attending: Internal Medicine | Admitting: Internal Medicine

## 2020-08-05 ENCOUNTER — Other Ambulatory Visit: Payer: Self-pay

## 2020-08-05 DIAGNOSIS — Z1231 Encounter for screening mammogram for malignant neoplasm of breast: Secondary | ICD-10-CM | POA: Diagnosis present

## 2020-08-12 ENCOUNTER — Other Ambulatory Visit: Payer: Self-pay | Admitting: Neurosurgery

## 2020-08-12 DIAGNOSIS — M5416 Radiculopathy, lumbar region: Secondary | ICD-10-CM

## 2020-08-26 ENCOUNTER — Ambulatory Visit: Payer: Medicare Other

## 2020-08-27 ENCOUNTER — Other Ambulatory Visit: Payer: Self-pay

## 2020-08-27 ENCOUNTER — Ambulatory Visit
Admission: RE | Admit: 2020-08-27 | Discharge: 2020-08-27 | Disposition: A | Discharge status: Home / Self Care | Payer: Medicare Other | Source: Ambulatory Visit | Attending: Neurosurgery | Admitting: Neurosurgery

## 2020-08-27 DIAGNOSIS — M5416 Radiculopathy, lumbar region: Secondary | ICD-10-CM | POA: Insufficient documentation

## 2021-01-16 ENCOUNTER — Other Ambulatory Visit: Payer: Self-pay

## 2021-01-16 ENCOUNTER — Emergency Department: Payer: Medicare Other

## 2021-01-16 ENCOUNTER — Emergency Department
Admission: EM | Admit: 2021-01-16 | Discharge: 2021-01-16 | Disposition: A | Discharge status: Home / Self Care | Payer: Medicare Other | Attending: Emergency Medicine | Admitting: Emergency Medicine

## 2021-01-16 ENCOUNTER — Encounter: Payer: Self-pay | Admitting: Emergency Medicine

## 2021-01-16 DIAGNOSIS — N390 Urinary tract infection, site not specified: Secondary | ICD-10-CM | POA: Diagnosis not present

## 2021-01-16 DIAGNOSIS — Z87891 Personal history of nicotine dependence: Secondary | ICD-10-CM | POA: Diagnosis not present

## 2021-01-16 DIAGNOSIS — R42 Dizziness and giddiness: Secondary | ICD-10-CM | POA: Diagnosis not present

## 2021-01-16 DIAGNOSIS — R4020 Unspecified coma: Secondary | ICD-10-CM | POA: Diagnosis present

## 2021-01-16 DIAGNOSIS — R4189 Other symptoms and signs involving cognitive functions and awareness: Secondary | ICD-10-CM

## 2021-01-16 DIAGNOSIS — I10 Essential (primary) hypertension: Secondary | ICD-10-CM | POA: Diagnosis not present

## 2021-01-16 LAB — URINALYSIS, COMPLETE (UACMP) WITH MICROSCOPIC
Bilirubin Urine: NEGATIVE
Glucose, UA: NEGATIVE mg/dL
Hgb urine dipstick: NEGATIVE
Ketones, ur: NEGATIVE mg/dL
Nitrite: NEGATIVE
Protein, ur: NEGATIVE mg/dL
Specific Gravity, Urine: 1.021 (ref 1.005–1.030)
pH: 5 (ref 5.0–8.0)

## 2021-01-16 LAB — BASIC METABOLIC PANEL
Anion gap: 6 (ref 5–15)
BUN: 16 mg/dL (ref 8–23)
CO2: 25 mmol/L (ref 22–32)
Calcium: 9.2 mg/dL (ref 8.9–10.3)
Chloride: 111 mmol/L (ref 98–111)
Creatinine, Ser: 1.04 mg/dL — ABNORMAL HIGH (ref 0.44–1.00)
GFR, Estimated: 57 mL/min — ABNORMAL LOW (ref >60)
Glucose, Bld: 107 mg/dL — ABNORMAL HIGH (ref 70–99)
Potassium: 3.9 mmol/L (ref 3.5–5.1)
Sodium: 142 mmol/L (ref 135–145)

## 2021-01-16 LAB — CBC
HCT: 40.3 % (ref 36.0–46.0)
Hemoglobin: 13.2 g/dL (ref 12.0–15.0)
MCH: 30.7 pg (ref 26.0–34.0)
MCHC: 32.8 g/dL (ref 30.0–36.0)
MCV: 93.7 fL (ref 80.0–100.0)
Platelets: 230 10*3/uL (ref 150–400)
RBC: 4.3 MIL/uL (ref 3.87–5.11)
RDW: 12.9 % (ref 11.5–15.5)
WBC: 6.4 10*3/uL (ref 4.0–10.5)
nRBC: 0 % (ref 0.0–0.2)

## 2021-01-16 LAB — TROPONIN I (HIGH SENSITIVITY): Troponin I (High Sensitivity): 14 ng/L (ref <18)

## 2021-01-16 MED ORDER — FOSFOMYCIN TROMETHAMINE 3 G PO PACK
3.0000 g | PACK | Freq: Once | ORAL | Status: AC
  Administered 2021-01-16: 3 g via ORAL
  Filled 2021-01-16: qty 3

## 2021-01-16 MED ORDER — LORAZEPAM 2 MG/ML IJ SOLN: 1.0000 mg | Freq: Once | INTRAMUSCULAR | Status: DC

## 2021-01-16 NOTE — Discharge Instructions (Addendum)
As we discussed I think it is reasonable to hold the Cymbalta dose tomorrow. Please seek medical attention for any high fevers, chest pain, shortness of breath, change in behavior, persistent vomiting, bloody stool or any other new or concerning symptoms.

## 2021-01-16 NOTE — ED Notes (Signed)
Patient returned from MRI at this time.  

## 2021-01-16 NOTE — ED Notes (Signed)
Patient currently in MRI.

## 2021-01-16 NOTE — ED Provider Notes (Signed)
[facility] Emergency Department Provider Note   ____________________________________________   I have reviewed the triage vital signs and the nursing notes.   HISTORY  Chief Complaint Decreased responsiveness   History limited by: Not Limited   HPI Carmen [name] is a 70 y.o. female who presents to the emergency department today after an episode of decreased responsiveness.  The patient apparently takes naps typically in the afternoon.  However today when the husband went to wake her up he was having a hard time waking her.  When she did finally wake up she repetitively answered I am okay.  She did seem somewhat confused.  This has improved by the time of my exam.  Notably the patient has recently been placed on Cymbalta.  They are switching her off of Abilify.  However she is still taking both medications.  Furthermore she has been having intermittent episodes of dizziness over the past few weeks.  She denies any pattern to the dizziness.  She denies any chest pain.  No recent fevers.  Records reviewed. Per medical record review patient has a history of HTN, depression, nausea and vomiting.   Past Medical History:  Diagnosis Date   Anxiety    Arthritis    back   Depression    Hypertension    controlled on meds   Wears partial dentures    upper    Patient Active Problem List   Diagnosis Date Noted   Diarrhea 03/25/2020   Intractable nausea and vomiting 03/23/2020   Abdominal pain 03/23/2020   Depression with anxiety    SIRS (systemic inflammatory response syndrome) (HCC)    Hypotension    Dehydration    AKI (acute kidney injury) [facility])     Past Surgical History:  Procedure Laterality Date   ABDOMINAL HYSTERECTOMY     BACK SURGERY  2009   discectomy   CATARACT EXTRACTION W/PHACO Right 03/21/2016   Procedure: CATARACT EXTRACTION PHACO AND INTRAOCULAR LENS PLACEMENT ([place]);  Surgeon: Ronnell Freshwater, MD;  Location: [facility];  Service: Ophthalmology;  Laterality: Right;  RIGHT   CATARACT EXTRACTION W/PHACO Left 05/09/2016   Procedure: CATARACT EXTRACTION PHACO AND INTRAOCULAR LENS PLACEMENT ([place])  Left;  Surgeon: Ronnell Freshwater, MD;  Location: [facility];  Service: Ophthalmology;  Laterality: Left;   COLONOSCOPY     KYPHOPLASTY N/A 01/18/2018   Procedure: [id number];  Surgeon: Hessie Knows, MD;  Location: [facility];  Service: Orthopedics;  Laterality: N/A;   LUMBAR LAMINECTOMY/ DECOMPRESSION WITH MET-RX Left 04/22/2019   Procedure: LEFT LATERAL L4-5 DISCECTOMY WITH METRX;  Surgeon: [name], MD;  Location: [facility];  Service: Neurosurgery;  Laterality: Left;   NECK SURGERY  2009   discectomy    Prior to Admission medications   Medication Sig Start Date End Date Taking? Authorizing Provider  ARIPiprazole (ABILIFY) 2 MG tablet Take 2 mg by mouth daily.  03/03/19   [provider]  clonazePAM (KLONOPIN) 0.5 MG tablet Take 0.25 mg by mouth 2 (two) times daily.     [provider]  KLOR-CON M10 10 MEQ tablet Take 20 mEq by mouth 3 (three) times daily. 02/23/19   [provider]  losartan-hydrochlorothiazide (HYZAAR) 100-12.5 MG tablet Take 1 tablet by mouth daily.     [provider]  metoprolol succinate (TOPROL-XL) 50 MG 24 hr tablet Take 50 mg by mouth daily. Take with or immediately following a meal.    [provider]  OLANZapine (ZYPREXA) 10 MG  tablet Take 10-20 mg by mouth See admin instructions. Take 1 tablet (10 mg) by mouth in the morning & take 2 tablets (20 mg) by mouth at bedtime.    [provider]  sertraline (ZOLOFT) 100 MG tablet Take 200 mg by mouth daily.     [provider]  verapamil (CALAN-SR) 240 MG CR tablet Take 240 mg by mouth daily. 11/30/17   [provider]    Allergies Prednisone  Family History  Problem Relation Age of Onset   Breast cancer Sister 45    Social History Social History    Tobacco Use   Smoking status: Former    Packs/day: 1.00    Years: 20.00    Pack years: 20.00    Types: Cigarettes    Quit date: 1990    Years since quitting: 32.7   Smokeless tobacco: Never   Tobacco comments:    quit 32 years ago  Vaping Use   Vaping Use: Never used  Substance Use Topics   Alcohol use: No   Drug use: No    Review of Systems Constitutional: No fever/chills Eyes: No visual changes. ENT: No sore throat. Cardiovascular: Denies chest pain. Respiratory: Denies shortness of breath. Gastrointestinal: No abdominal pain.  No nausea, no vomiting.  No diarrhea.   Genitourinary: Negative for dysuria. Musculoskeletal: Negative for back pain. Skin: Negative for rash. Neurological: Positive for dizziness. Decreased responsiveness.   ____________________________________________   PHYSICAL EXAM:  VITAL SIGNS: ED Triage Vitals  Enc Vitals Group     BP 01/16/21 1829 (!) 192/82     Pulse Rate 01/16/21 1829 67     Resp 01/16/21 1829 18     Temp 01/16/21 1829 98 F (36.7 C)     Temp src --      SpO2 01/16/21 1829 94 %     Weight 01/16/21 1830 158 lb (71.7 kg)     Height 01/16/21 1830 $RemoveBefor'5\' 1"'[id number]$  (1.549 m)     Head Circumference --      Peak Flow --      Pain Score 01/16/21 1829 0   Constitutional: Alert and oriented.  Eyes: Conjunctivae are normal.  ENT      Head: Normocephalic and atraumatic.      Nose: No congestion/rhinnorhea.      Mouth/Throat: Mucous membranes are moist.      Neck: No stridor. Hematological/Lymphatic/Immunilogical: No cervical lymphadenopathy. Cardiovascular: Normal rate, regular rhythm.  No murmurs, rubs, or gallops.  Respiratory: Normal respiratory effort without tachypnea nor retractions. Breath sounds are clear and equal bilaterally. No wheezes/rales/rhonchi. Gastrointestinal: Soft and non tender. No rebound. No guarding.  Genitourinary: Deferred Musculoskeletal: Normal range of motion in all extremities. No lower extremity  edema. Neurologic:  Normal speech and language. Face symmetric. EOMI. PERRL. Strength 5/5 in upper and lower extremities. No gross focal neurologic deficits are appreciated.  Skin:  Skin is warm, dry and intact. No rash noted. Psychiatric: Mood and affect are normal. Speech and behavior are normal. Patient exhibits appropriate insight and judgment.  ____________________________________________    LABS (pertinent positives/negatives)  CBC wbc 6.4, hgb 13.2, plt 230 Trop hs 14 BMP wnl except glu 107, cr 1.04 UA hazy, small, wbc 6-10 ____________________________________________   EKG  I, Nance Pear, attending physician, personally viewed and interpreted this EKG  EKG Time: 1824 Rate: 71 Rhythm: sinus rhythm Axis: normal Intervals: qtc 418 QRS: narrow ST changes: no st elevation, t wave inversion V1 Impression: abnormal ekg   ____________________________________________  RADIOLOGY  CT head No acute abnormality  MR brain No acute abnormality  ____________________________________________   PROCEDURES  Procedures  ____________________________________________   INITIAL IMPRESSION / ASSESSMENT AND PLAN / ED COURSE  Pertinent labs & imaging results that were available during my care of the patient were reviewed by me and considered in my medical decision making (see chart for details).   Patient presented to the emergency department today because of concerns for decreased responsiveness after a nap today.  Work-up here in the emergency department without any clear etiology.  Urine did show potentially some signs of urinary tract infection.  MRI was negative for acute stroke.  At this time do wonder however the patient's symptoms are more related to her being both on Cymbalta and Abilify at this time.  Discussed this with the patient and family.  This time do think is reasonable for patient be discharged home.  Encouraged close follow up with PCP and  psychiatry  ____________________________________________   FINAL CLINICAL IMPRESSION(S) / ED DIAGNOSES  Final diagnoses:  Decreased responsiveness  Lower urinary tract infectious disease     Note: This dictation was prepared with Dragon dictation. Any transcriptional errors that result from this process are unintentional     Nance Pear, MD 01/17/21 0004

## 2021-01-16 NOTE — ED Triage Notes (Signed)
Pt to ER via EMS, per son husband went in to wake pt from her afternoon nap.  She was hard to arouse and when she did wake, she repeated, "I'm ok", over and over.  Pt recently made a change from abilify to cymbalta, starting yesterday, but is taking both medications per MD instructions.  Pt has also had reports of dizziness for 3 weeks.  D/c'd one of 3 BP meds without changes.  Pt reports nausea this AM, but that has subsided.

## 2021-01-16 NOTE — ED Notes (Signed)
Patient updated on plan of care.  Family at bedside.  No c/o voiced at this time

## 2021-04-16 DIAGNOSIS — E538 Deficiency of other specified B group vitamins: Secondary | ICD-10-CM | POA: Insufficient documentation

## 2021-05-06 ENCOUNTER — Other Ambulatory Visit: Payer: Self-pay | Admitting: Neurology

## 2021-05-06 DIAGNOSIS — M542 Cervicalgia: Secondary | ICD-10-CM

## 2021-05-18 ENCOUNTER — Other Ambulatory Visit: Payer: Self-pay

## 2021-05-18 ENCOUNTER — Ambulatory Visit
Admission: RE | Admit: 2021-05-18 | Discharge: 2021-05-18 | Disposition: A | Discharge status: Home / Self Care | Payer: Medicare Other | Source: Ambulatory Visit | Attending: Neurology | Admitting: Neurology

## 2021-05-18 DIAGNOSIS — M542 Cervicalgia: Secondary | ICD-10-CM

## 2021-08-05 ENCOUNTER — Ambulatory Visit (INDEPENDENT_AMBULATORY_CARE_PROVIDER_SITE_OTHER): Payer: Medicare Other | Admitting: Psychiatry

## 2021-08-05 DIAGNOSIS — F332 Major depressive disorder, recurrent severe without psychotic features: Secondary | ICD-10-CM | POA: Diagnosis not present

## 2021-08-05 NOTE — Progress Notes (Signed)
Virtual Visit via Telephone Note ? ?I connected with Carmen [name] on 08/05/21 at  1:00 PM EDT by telephone and verified that I am speaking with the correct person using two identifiers. ? ?Location: ?Patient: Home ?Provider: Hospital ?  ?I discussed the limitations, risks, security and privacy concerns of performing an evaluation and management service by telephone and the availability of in person appointments. I also discussed with the patient that there may be a patient responsible charge related to this service. The patient expressed understanding and agreed to proceed. ? ? ?History of Present Illness: Patient was reached by telephone for a scheduled consult appointment to discuss electroconvulsive therapy.  Patient was reached by telephone and our identities were established.  Her husband was also on the line with the patient's consent.  Patient was referred by her primary psychiatrist, Dr. Nicolasa Ducking, for consideration of ECT.  Patient states she currently feels like she is very depressed.  Her mood feels sad and negative much of the time.  Her energy level is very low.  She has lost interest in most of her usual activities.  She tries to push herself to get out of bed every day but is not doing anything like her normal activity.  She cannot concentrate very well.  Her sleep is adequate at this point only thanks to the medicine that she is being prescribed.  Appetite remains okay.  Patient denies that she is having any suicidal thoughts at all.  She also denies any hallucinations or paranoia and does not describe any other psychotic symptoms.  Patient and lives with her husband appears to have a very supportive family.  Does not identify a specific trauma as a reason for the worsening depression which apparently has been bad for the past several months.  She currently sees an outpatient psychiatrist and is compliant with a combination of olanzapine, sertraline and venlafaxine clonazepam and carbamazepine for  mood.  Despite this she reports no improvement.  Patient also has high blood pressure for which she takes medication.  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  She denies any history of myocardial infarction or stroke or head injury or surgery to her skull or head.  Patient is requesting ECT based on her previous history of good response.  Denies any substance abuse ? ?Patient has a long history of depression going back 20 or more years.  She has had electroconvulsive therapy in the past and reports remembering at least 2 separate sessions of it but the most recent one was probably at least 15 years ago.  Both were done at our facility under the treatment of Dr. Thurmond Butts.  Patient reports good tolerance and good response to ECT in the past.  Denies any history of suicide attempts.  Denies any history of mania or ever having been told she had bipolar disorder. ? ?  ?Observations/Objective: Patient was appropriate and on time and aware of the consultation.  Appropriate interaction with clear answers to question and good description of symptoms.  Mood described as depressed affect somewhat blunted.  Thoughts lucid no sign of delusional thinking or loosening of associations.  Denied any suicidal or homicidal ideation.  Denied hallucinations or paranoia.  Full cognitive testing not done but the patient is describing subjective problems with concentration but was able to name her medicines correctly and appropriately answer questions and appears to have adequate immediate memory.  Patient was given opportunity repeatedly to ask questions.  ECT procedure in general and  the specifics of our current treatment here at the hospital were provided.  Patient asked appropriate questions and appeared to understand the answers.  She is agreeable to a recommendation for ECT. ? ? ?Assessment and Plan: Patient with recurrent severe major depression and a previous good response to ECT and a current lack of adequate  response despite appropriate medication.  Good candidate for ECT.  No medical contraindications.  Reviewed with the patient that we would like her to stop the carbamazepine at least 3 days prior to initiating treatment.  Because it is not prescribed for seizures this should be safe.  Reviewed with her that we would like her to limit clonazepam to not having taken a pill within 24 hours of treatment if at all possible.  Patient agreed to both of these.  Information will be provided to the full ECT staff and we will work on arrangements to get the lab testing and arrange for treatment schedule.  With the patient's consent I have informed her primary psychiatrist of the plan ? ? ?Follow Up Instructions: Await instructions from ECT  team.  If you have any more questions do not hesitate to get in touch with me ? ?  ?I discussed the assessment and treatment plan with the patient. The patient was provided an opportunity to ask questions and all were answered. The patient agreed with the plan and demonstrated an understanding of the instructions. ?  ?The patient was advised to call back or seek an in-person evaluation if the symptoms worsen or if the condition fails to improve as anticipated. ? ?I provided 40 minutes of non-face-to-face time during this encounter. ? ? ?Alethia Berthold, MD  ?

## 2021-08-16 ENCOUNTER — Inpatient Hospital Stay: Admission: RE | Admit: 2021-08-16 | Payer: Medicare Other | Source: Ambulatory Visit

## 2021-08-18 ENCOUNTER — Inpatient Hospital Stay: Admission: RE | Admit: 2021-08-18 | Payer: Medicare Other | Source: Ambulatory Visit

## 2021-08-18 ENCOUNTER — Ambulatory Visit
Admission: RE | Admit: 2021-08-18 | Discharge: 2021-08-18 | Disposition: A | Discharge status: Home / Self Care | Payer: Medicare Other | Source: Ambulatory Visit | Attending: Psychiatry | Admitting: Psychiatry

## 2021-08-18 ENCOUNTER — Encounter
Admission: RE | Admit: 2021-08-18 | Discharge: 2021-08-18 | Disposition: A | Discharge status: Home / Self Care | Payer: Medicare Other | Source: Ambulatory Visit | Attending: Psychiatry | Admitting: Psychiatry

## 2021-08-18 ENCOUNTER — Other Ambulatory Visit: Payer: Self-pay | Admitting: Psychiatry

## 2021-08-18 VITALS — BP 121/71 | HR 68 | Temp 98.1°F | Resp 12 | Ht 62.0 in | Wt 159.2 lb

## 2021-08-18 DIAGNOSIS — Z01818 Encounter for other preprocedural examination: Secondary | ICD-10-CM

## 2021-08-18 DIAGNOSIS — R200 Anesthesia of skin: Secondary | ICD-10-CM

## 2021-08-18 LAB — URINALYSIS, COMPLETE (UACMP) WITH MICROSCOPIC
Bacteria, UA: NONE SEEN
Bilirubin Urine: NEGATIVE
Glucose, UA: NEGATIVE mg/dL
Hgb urine dipstick: NEGATIVE
Ketones, ur: NEGATIVE mg/dL
Nitrite: NEGATIVE
Protein, ur: NEGATIVE mg/dL
Specific Gravity, Urine: 1.014 (ref 1.005–1.030)
pH: 5 (ref 5.0–8.0)

## 2021-08-18 LAB — CBC
HCT: 38.9 % (ref 36.0–46.0)
Hemoglobin: 12.9 g/dL (ref 12.0–15.0)
MCH: 30.4 pg (ref 26.0–34.0)
MCHC: 33.2 g/dL (ref 30.0–36.0)
MCV: 91.7 fL (ref 80.0–100.0)
Platelets: 259 10*3/uL (ref 150–400)
RBC: 4.24 MIL/uL (ref 3.87–5.11)
RDW: 12.2 % (ref 11.5–15.5)
WBC: 9.4 10*3/uL (ref 4.0–10.5)
nRBC: 0 % (ref 0.0–0.2)

## 2021-08-18 LAB — BASIC METABOLIC PANEL
Anion gap: 10 (ref 5–15)
BUN: 22 mg/dL (ref 8–23)
CO2: 24 mmol/L (ref 22–32)
Calcium: 9.4 mg/dL (ref 8.9–10.3)
Chloride: 105 mmol/L (ref 98–111)
Creatinine, Ser: 1.14 mg/dL — ABNORMAL HIGH (ref 0.44–1.00)
GFR, Estimated: 51 mL/min — ABNORMAL LOW (ref >60)
Glucose, Bld: 97 mg/dL (ref 70–99)
Potassium: 3.5 mmol/L (ref 3.5–5.1)
Sodium: 139 mmol/L (ref 135–145)

## 2021-08-21 ENCOUNTER — Other Ambulatory Visit: Payer: Self-pay | Admitting: Psychiatry

## 2021-08-23 ENCOUNTER — Encounter: Payer: Self-pay | Admitting: Certified Registered Nurse Anesthetist

## 2021-08-23 ENCOUNTER — Encounter
Admission: RE | Admit: 2021-08-23 | Discharge: 2021-08-23 | Disposition: A | Discharge status: Home / Self Care | Payer: Medicare Other | Source: Ambulatory Visit | Attending: Psychiatry | Admitting: Psychiatry

## 2021-08-23 DIAGNOSIS — F419 Anxiety disorder, unspecified: Secondary | ICD-10-CM | POA: Diagnosis not present

## 2021-08-23 DIAGNOSIS — Z87891 Personal history of nicotine dependence: Secondary | ICD-10-CM | POA: Insufficient documentation

## 2021-08-23 DIAGNOSIS — I10 Essential (primary) hypertension: Secondary | ICD-10-CM | POA: Diagnosis not present

## 2021-08-23 DIAGNOSIS — F338 Other recurrent depressive disorders: Secondary | ICD-10-CM | POA: Insufficient documentation

## 2021-08-23 DIAGNOSIS — F332 Major depressive disorder, recurrent severe without psychotic features: Secondary | ICD-10-CM

## 2021-08-23 DIAGNOSIS — M1990 Unspecified osteoarthritis, unspecified site: Secondary | ICD-10-CM | POA: Diagnosis not present

## 2021-08-23 MED ORDER — GLYCOPYRROLATE 0.2 MG/ML IJ SOLN
0.1000 mg | Freq: Once | INTRAMUSCULAR | Status: AC
  Administered 2021-08-23: 0.1 mg via INTRAVENOUS

## 2021-08-23 MED ORDER — SODIUM CHLORIDE 0.9 % IV SOLN
500.0000 mL | Freq: Once | INTRAVENOUS | Status: AC
  Administered 2021-08-23: 500 mL via INTRAVENOUS

## 2021-08-23 MED ORDER — METHOHEXITAL SODIUM 100 MG/10ML IV SOSY
PREFILLED_SYRINGE | INTRAVENOUS | Status: DC | PRN
Start: 2021-08-23 — End: 2021-08-23
  Administered 2021-08-23: 70 mg via INTRAVENOUS

## 2021-08-23 MED ORDER — SODIUM CHLORIDE 0.9 % IV SOLN: INTRAVENOUS | Status: DC | PRN

## 2021-08-23 MED ORDER — SUCCINYLCHOLINE CHLORIDE 200 MG/10ML IV SOSY
PREFILLED_SYRINGE | INTRAVENOUS | Status: DC | PRN
  Administered 2021-08-23: 90 mg via INTRAVENOUS

## 2021-08-23 MED ORDER — GLYCOPYRROLATE 0.2 MG/ML IJ SOLN
INTRAMUSCULAR | Status: AC
  Filled 2021-08-23: qty 1

## 2021-08-23 NOTE — Anesthesia Procedure Notes (Signed)
Procedure Name: General with mask airway ?Date/Time: 08/23/2021 12:37 PM ?Performed by: Lily Peer, Makalynn Berwanger, CRNA ?Pre-anesthesia Checklist: Patient identified, Patient being monitored, Timeout performed, Suction available and Emergency Drugs available ?Patient Re-evaluated:Patient Re-evaluated prior to induction ?Oxygen Delivery Method: Circle system utilized ?Preoxygenation: Pre-oxygenation with 100% oxygen ?Induction Type: IV induction ?Ventilation: Mask ventilation throughout procedure ? ? ? ? ?

## 2021-08-23 NOTE — H&P (Signed)
Carmen [name] is an 71 y.o. female.   ?Chief Complaint: Patient seen and chart reviewed.  71 year old woman starting ECT.  Mood depressed down and negative feeling tired low energy.  No active suicidal ideation ?HPI: History of severe recurrent depression with previous response to ECT ? ?Past Medical History:  ?Diagnosis Date  ? Anxiety   ? Arthritis   ? back  ? Depression   ? Hypertension   ? controlled on meds  ? Wears partial dentures   ? upper  ? ? ?Past Surgical History:  ?Procedure Laterality Date  ? ABDOMINAL HYSTERECTOMY    ? CATARACT EXTRACTION W/PHACO Right 03/21/2016  ? Procedure: CATARACT EXTRACTION PHACO AND INTRAOCULAR LENS PLACEMENT (IOC);  Surgeon: Ronnell Freshwater, MD;  Location: [place];  Service: Ophthalmology;  Laterality: Right;  RIGHT  ? CATARACT EXTRACTION W/PHACO Left 05/09/2016  ? Procedure: CATARACT EXTRACTION PHACO AND INTRAOCULAR LENS PLACEMENT ([place])  Left;  Surgeon: Ronnell Freshwater, MD;  Location: [place];  Service: Ophthalmology;  Laterality: Left;  ? CERVICAL SPINE SURGERY  2009  ? discectomy  ? COLONOSCOPY    ? KYPHOPLASTY N/A 01/18/2018  ? Procedure: [id number];  Surgeon: Hessie Knows, MD;  Location: [facility];  Service: Orthopedics;  Laterality: N/A;  ? LUMBAR LAMINECTOMY/ DECOMPRESSION WITH MET-RX Left 04/22/2019  ? Procedure: LEFT LATERAL L4-5 DISCECTOMY WITH METRX;  Surgeon: [name], MD;  Location: [facility];  Service: Neurosurgery;  Laterality: Left;  ? ? ?Family History  ?Problem Relation Age of Onset  ? Breast cancer Sister 74  ? ?Social History:  reports that she quit smoking about 38 years ago. Her smoking use included cigarettes. She has a 20.00 pack-year smoking history. She has never used smokeless tobacco. She reports that she does not drink alcohol and does not use drugs. ? ?Allergies:  ?Allergies  ?Allergen Reactions  ? Prednisone Other (See Comments)  ?  Severe depression  ? ? ?(Not in a hospital  admission) ? ? ?No results found for this or any previous visit (from the past 48 hour(s)). ?No results found. ? ?Review of Systems  ?Constitutional: Negative.   ?HENT: Negative.    ?Eyes: Negative.   ?Respiratory: Negative.    ?Cardiovascular: Negative.   ?Gastrointestinal: Negative.   ?Musculoskeletal: Negative.   ?Skin: Negative.   ?Neurological: Negative.   ?Psychiatric/Behavioral:  Positive for dysphoric mood.   ?All other systems reviewed and are negative. ? ?Blood pressure 132/62, pulse (!) 58, temperature (!) 97.5 ?F (36.4 ?C), temperature source Tympanic, resp. rate 18, height $RemoveBe'5\' 2"'PyJuPqFhj$  (1.575 m), weight 72.6 kg, SpO2 (!) 88 %. ?Physical Exam ?Vitals and nursing note reviewed.  ?Constitutional:   ?   Appearance: She is well-developed.  ?HENT:  ?   Head: Normocephalic and atraumatic.  ?Eyes:  ?   Conjunctiva/sclera: Conjunctivae normal.  ?   Pupils: Pupils are equal, round, and reactive to light.  ?Cardiovascular:  ?   Heart sounds: Normal heart sounds.  ?Pulmonary:  ?   Effort: Pulmonary effort is normal.  ?Abdominal:  ?   Palpations: Abdomen is soft.  ?Musculoskeletal:     ?   General: Normal range of motion.  ?   Cervical back: Normal range of motion.  ?Skin: ?   General: Skin is warm and dry.  ?Neurological:  ?   Mental Status: She is alert.  ?Psychiatric:     ?   Attention and Perception: Attention normal.     ?   Mood and Affect: Mood  is depressed.     ?   Speech: Speech is delayed.     ?   Behavior: Behavior is slowed.  ?  ? ?Assessment/Plan ?Begin bilateral treatment ? ?Alethia Berthold, MD ?08/23/2021, 4:37 PM ? ? ? ?

## 2021-08-23 NOTE — Procedures (Signed)
ECT SERVICES ?Physician?s Interval Evaluation & Treatment Note ? ?Patient Identification: Carmen [name] ?MRN:  [id number] ?Date of Evaluation:  08/23/2021 ?[place] #: 1 ? ?MADRS:  ? ?MMSE:  ? ?P.E. Findings: ? ?Unremarkable physical exam ? ?Psychiatric Interval Note: ? ?Depressed but not psychotic ? ?Subjective: ? ?Patient is a 71 y.o. female seen for evaluation for Electroconvulsive Therapy. ?Withdrawn but no physical complaints ? ?Treatment Summary: ? ? '[]'$   Right Unilateral             '[x]'$  Bilateral ?  ?% Energy : 1.0 ms 40% ? ? Impedance: 1680 ohms ? ?Seizure Energy Index: 24,456 ?V squared ? ?Postictal Suppression Index: 48% ? ?Seizure Concordance Index: 97% ? ?Medications ? ?Pre Shock: Brevital 70 mg succinylcholine 90 mg ? ?Post Shock:   ? ?Seizure Duration: 50 seconds EMG 82 seconds EEG ? ? ?Comments: ?Patient took a long time to recover proper oxygenation for some reason.  We may consider some changes in anesthetic.  Otherwise good quality seizure return on Wednesday ? ?Lungs: ? ?'[x]'$   Clear to auscultation              ? ?'[]'$  Other:  ? ?Heart: ?  ? '[x]'$   Regular rhythm             '[]'$  irregular rhythm ? ? ? '[x]'$   Previous H&P reviewed, patient examined and there are NO CHANGES              ?  ? '[]'$   Previous H&P reviewed, patient examined and there are changes noted. ? ? ?Alethia Berthold, MD ?5/8/20234:57 PM ? ?  ?

## 2021-08-23 NOTE — Anesthesia Preprocedure Evaluation (Addendum)
Anesthesia Evaluation  ?Patient identified by MRN, date of birth, ID band ?Patient awake ? ? ? ?Reviewed: ?Allergy & Precautions, NPO status , Patient's Chart, lab work & pertinent test results, reviewed documented beta blocker date and time  ? ?Airway ?Mallampati: IV ? ?TM Distance: >3 FB ?Neck ROM: Full ? ? ? Dental ? ?(+) Partial Upper, Teeth Intact, Dental Advisory Given, Partial Lower ?  ?Pulmonary ?Patient abstained from smoking., former smoker,  ?  ?breath sounds clear to auscultation ? ? ? ? ? ? Cardiovascular ?Exercise Tolerance: Good ?hypertension, Pt. on medications and Pt. on home beta blockers ?(-) Past MI  ?Rhythm:Regular Rate:Normal ? ? ?  ?Neuro/Psych ?PSYCHIATRIC DISORDERS Anxiety Depression negative neurological ROS ?   ? GI/Hepatic ?Neg liver ROS,   ?Endo/Other  ?negative endocrine ROS ? Renal/GU ?Renal InsufficiencyRenal disease  ? ?  ?Musculoskeletal ? ?(+) Arthritis , Osteoarthritis,   ? Abdominal ?Normal abdominal exam  (+)   ?Peds ? Hematology ?  ?Anesthesia Other Findings ?Past Medical History: ?No date: Anxiety ?No date: Arthritis ?    Comment:  back ?No date: Depression ?No date: Hypertension ?    Comment:  controlled on meds ?No date: Wears partial dentures ?    Comment:  upper ? ? Reproductive/Obstetrics ? ?  ? ? ? ? ? ? ? ? ? ? ? ? ? ?  ?  ? ? ? ? ? ? ?Anesthesia Physical ? ?Anesthesia Plan ? ?ASA: 2 ? ?Anesthesia Plan: General  ? ?Post-op Pain Management:   ? ?Induction: Intravenous ? ?PONV Risk Score and Plan: 4 or greater and TIVA ? ?Airway Management Planned: Natural Airway and Mask ? ?Additional Equipment:  ? ?Intra-op Plan:  ? ?Post-operative Plan:  ? ?Informed Consent: I have reviewed the patients History and Physical, chart, labs and discussed the procedure including the risks, benefits and alternatives for the proposed anesthesia with the patient or authorized representative who has indicated his/her understanding and acceptance.  ? ? ? ?Dental  advisory given ? ?Plan Discussed with: CRNA and Surgeon ? ?Anesthesia Plan Comments:   ? ? ? ? ? ?Anesthesia Quick Evaluation ? ?

## 2021-08-23 NOTE — Transfer of Care (Signed)
Immediate Anesthesia Transfer of Care Note ? ?Patient: Carmen [name] ? ?Procedure(s) Performed: ECT TX ? ?Patient Location: PACU ? ?Anesthesia Type:General ? ?Level of Consciousness: drowsy ? ?Airway & Oxygen Therapy: Patient Spontanous Breathing and Patient connected to face mask oxygen ? ?Post-op Assessment: Report given to RN and Post -op Vital signs reviewed and stable ? ?Post vital signs: Reviewed and stable ? ?Last Vitals:  ?Vitals Value Taken Time  ?BP 122/60   ?Temp    ?Pulse 64   ?Resp 16   ?SpO2 97   ? ? ?Last Pain:  ?Vitals:  ? 08/23/21 1052  ?TempSrc:   ?PainSc: 0-No pain  ?   ? ?  ? ?Complications: No notable events documented. ?

## 2021-08-24 ENCOUNTER — Other Ambulatory Visit: Payer: Self-pay | Admitting: Psychiatry

## 2021-08-24 NOTE — Anesthesia Postprocedure Evaluation (Signed)
Anesthesia Post Note ? ?Patient: Carmen [name] ? ?Procedure(s) Performed: ECT TX ? ?Patient location during evaluation: PACU ?Anesthesia Type: General ?Level of consciousness: awake and alert ?Pain management: pain level controlled ?Vital Signs Assessment: post-procedure vital signs reviewed and stable ?Respiratory status: spontaneous breathing, nonlabored ventilation, respiratory function stable and patient connected to nasal cannula oxygen ?Cardiovascular status: blood pressure returned to baseline and stable ?Postop Assessment: no apparent nausea or vomiting ?Anesthetic complications: no ? ? ?No notable events documented. ? ? ?Last Vitals:  ?Vitals:  ? 08/23/21 1331 08/23/21 1345  ?BP:  132/62  ?Pulse: (!) 58 (!) 58  ?Resp: 19 18  ?Temp:  (!) 36.4 ?C  ?SpO2: (!) 88%   ?  ?Last Pain:  ?Vitals:  ? 08/23/21 1345  ?TempSrc: Tympanic  ?PainSc: 0-No pain  ? ? ?  ?  ?  ?  ?  ?  ? ?Molli Barrows ? ? ? ? ?

## 2021-08-25 ENCOUNTER — Encounter (HOSPITAL_BASED_OUTPATIENT_CLINIC_OR_DEPARTMENT_OTHER)
Admission: RE | Admit: 2021-08-25 | Discharge: 2021-08-25 | Disposition: A | Discharge status: Home / Self Care | Payer: Medicare Other | Source: Ambulatory Visit | Attending: Psychiatry | Admitting: Psychiatry

## 2021-08-25 ENCOUNTER — Ambulatory Visit: Payer: Self-pay | Admitting: Anesthesiology

## 2021-08-25 ENCOUNTER — Encounter: Payer: Self-pay | Admitting: Anesthesiology

## 2021-08-25 DIAGNOSIS — F338 Other recurrent depressive disorders: Secondary | ICD-10-CM | POA: Diagnosis not present

## 2021-08-25 DIAGNOSIS — F332 Major depressive disorder, recurrent severe without psychotic features: Secondary | ICD-10-CM | POA: Diagnosis not present

## 2021-08-25 MED ORDER — SODIUM CHLORIDE 0.9 % IV SOLN: INTRAVENOUS | Status: DC | PRN

## 2021-08-25 MED ORDER — GLYCOPYRROLATE 0.2 MG/ML IJ SOLN
INTRAMUSCULAR | Status: AC
  Filled 2021-08-25: qty 2

## 2021-08-25 MED ORDER — GLYCOPYRROLATE 0.2 MG/ML IJ SOLN
INTRAMUSCULAR | Status: AC
  Filled 2021-08-25: qty 1

## 2021-08-25 MED ORDER — LABETALOL HCL 5 MG/ML IV SOLN
INTRAVENOUS | Status: AC
  Filled 2021-08-25: qty 4

## 2021-08-25 MED ORDER — KETOROLAC TROMETHAMINE 30 MG/ML IJ SOLN
INTRAMUSCULAR | Status: AC
  Filled 2021-08-25: qty 2

## 2021-08-25 MED ORDER — SUCCINYLCHOLINE CHLORIDE 200 MG/10ML IV SOSY
PREFILLED_SYRINGE | INTRAVENOUS | Status: AC
Start: 2021-08-25 — End: ?
  Filled 2021-08-25: qty 10

## 2021-08-25 MED ORDER — SUCCINYLCHOLINE CHLORIDE 200 MG/10ML IV SOSY
PREFILLED_SYRINGE | INTRAVENOUS | Status: DC | PRN
  Administered 2021-08-25: 70 mg via INTRAVENOUS

## 2021-08-25 MED ORDER — METHOHEXITAL SODIUM 100 MG/10ML IV SOSY
PREFILLED_SYRINGE | INTRAVENOUS | Status: DC | PRN
  Administered 2021-08-25: 70 mg via INTRAVENOUS

## 2021-08-25 MED ORDER — ONDANSETRON HCL 4 MG/2ML IJ SOLN: 4.0000 mg | Freq: Once | INTRAMUSCULAR | Status: DC | PRN

## 2021-08-25 MED ORDER — SODIUM CHLORIDE 0.9 % IV SOLN
500.0000 mL | Freq: Once | INTRAVENOUS | Status: AC
  Administered 2021-08-25: 500 mL via INTRAVENOUS

## 2021-08-25 MED ORDER — GLYCOPYRROLATE 0.2 MG/ML IJ SOLN
0.1000 mg | Freq: Once | INTRAMUSCULAR | Status: AC
  Administered 2021-08-25: 0.1 mg via INTRAVENOUS

## 2021-08-25 NOTE — Anesthesia Preprocedure Evaluation (Signed)
Anesthesia Evaluation  ?Patient identified by MRN, date of birth, ID band ?Patient awake ? ? ? ?Reviewed: ?Allergy & Precautions, NPO status , Patient's Chart, lab work & pertinent test results, reviewed documented beta blocker date and time  ? ?History of Anesthesia Complications ?Negative for: history of anesthetic complications ? ?Airway ?Mallampati: III ? ?TM Distance: >3 FB ?Neck ROM: Full ? ? ? Dental ? ?(+) Partial Upper, Dental Advisory Given, Partial Lower, Teeth Intact ?  ?Pulmonary ?neg pulmonary ROS, Patient abstained from smoking., former smoker,  ?  ?breath sounds clear to auscultation ? ? ? ? ? ? Cardiovascular ?Exercise Tolerance: Good ?hypertension, Pt. on medications and Pt. on home beta blockers ?(-) Past MI  ?Rhythm:Regular Rate:Normal ? ? ?  ?Neuro/Psych ?PSYCHIATRIC DISORDERS Anxiety Depression negative neurological ROS ?   ? GI/Hepatic ?Neg liver ROS,   ?Endo/Other  ?negative endocrine ROS ? Renal/GU ?Renal InsufficiencyRenal disease  ? ?  ?Musculoskeletal ? ?(+) Arthritis , Osteoarthritis,   ? Abdominal ?Normal abdominal exam  (+)   ?Peds ? Hematology ?  ?Anesthesia Other Findings ?Past Medical History: ?No date: Anxiety ?No date: Arthritis ?    Comment:  back ?No date: Depression ?No date: Hypertension ?    Comment:  controlled on meds ?No date: Wears partial dentures ?    Comment:  upper ? ? Reproductive/Obstetrics ? ?  ? ? ? ? ? ? ? ? ? ? ? ? ? ?  ?  ? ? ? ? ? ? ? ? ?Anesthesia Physical ? ?Anesthesia Plan ? ?ASA: 2 ? ?Anesthesia Plan: General  ? ?Post-op Pain Management:   ? ?Induction: Intravenous ? ?PONV Risk Score and Plan: 4 or greater and TIVA ? ?Airway Management Planned: Natural Airway and Mask ? ?Additional Equipment: None ? ?Intra-op Plan:  ? ?Post-operative Plan:  ? ?Informed Consent: I have reviewed the patients History and Physical, chart, labs and discussed the procedure including the risks, benefits and alternatives for the proposed  anesthesia with the patient or authorized representative who has indicated his/her understanding and acceptance.  ? ? ? ?Dental advisory given ? ?Plan Discussed with: CRNA and Surgeon ? ?Anesthesia Plan Comments: (Discussed risks of anesthesia with patient, including PONV, muscle aches. Rare risks discussed as well, such as cardiorespiratory sequelae, need for airway intervention and its associated risks including lip/dental/eye damage and sore throat, and allergic reactions. Discussed the role of CRNA in patient's perioperative care. Patient understands.)  ? ? ? ? ? ? ?Anesthesia Quick Evaluation ? ?

## 2021-08-25 NOTE — Anesthesia Postprocedure Evaluation (Signed)
Anesthesia Post Note ? ?Patient: Carmen [name] ? ?Procedure(s) Performed: ECT TX ? ?Patient location during evaluation: PACU ?Anesthesia Type: General ?Level of consciousness: awake and alert ?Pain management: pain level controlled ?Vital Signs Assessment: post-procedure vital signs reviewed and stable ?Respiratory status: spontaneous breathing, nonlabored ventilation, respiratory function stable and patient connected to nasal cannula oxygen ?Cardiovascular status: blood pressure returned to baseline and stable ?Postop Assessment: no apparent nausea or vomiting ?Anesthetic complications: no ? ? ?No notable events documented. ? ? ?Last Vitals:  ?Vitals:  ? 08/25/21 1018 08/25/21 1200  ?BP: (!) 108/94 (!) 136/59  ?Pulse: (!) 58 64  ?Resp: 18 12  ?Temp: 36.7 ?C 36.5 ?C  ?SpO2: 98% 98%  ?  ?Last Pain:  ?Vitals:  ? 08/25/21 1020  ?TempSrc:   ?PainSc: 0-No pain  ? ? ?  ?  ?  ?  ?  ?  ? ?Arita Miss ? ? ? ? ?

## 2021-08-25 NOTE — H&P (Signed)
Carmen [name] is an 71 y.o. female.   ?Chief Complaint: Continues to have symptoms of depression but thinks she may be slightly more energetic. ?HPI: Recurrent severe depression responsive to ECT ? ?Past Medical History:  ?Diagnosis Date  ? Anxiety   ? Arthritis   ? back  ? Depression   ? Hypertension   ? controlled on meds  ? Wears partial dentures   ? upper  ? ? ?Past Surgical History:  ?Procedure Laterality Date  ? ABDOMINAL HYSTERECTOMY    ? CATARACT EXTRACTION W/PHACO Right 03/21/2016  ? Procedure: CATARACT EXTRACTION PHACO AND INTRAOCULAR LENS PLACEMENT (I[facility]);  Surgeon: Ronnell Freshwater, MD;  Location: [place];  Service: Ophthalmology;  Laterality: Right;  RIGHT  ? CATARACT EXTRACTION W/PHACO Left 05/09/2016  ? Procedure: CATARACT EXTRACTION PHACO AND INTRAOCULAR LENS PLACEMENT ([place])  Left;  Surgeon: Ronnell Freshwater, MD;  Location: [place];  Service: Ophthalmology;  Laterality: Left;  ? CERVICAL SPINE SURGERY  2009  ? discectomy  ? COLONOSCOPY    ? KYPHOPLASTY N/A 01/18/2018  ? Procedure: [id number];  Surgeon: Hessie Knows, MD;  Location: [facility];  Service: Orthopedics;  Laterality: N/A;  ? LUMBAR LAMINECTOMY/ DECOMPRESSION WITH MET-RX Left 04/22/2019  ? Procedure: LEFT LATERAL L4-5 DISCECTOMY WITH METRX;  Surgeon: [name], MD;  Location: [facility];  Service: Neurosurgery;  Laterality: Left;  ? ? ?Family History  ?Problem Relation Age of Onset  ? Breast cancer Sister 46  ? ?Social History:  reports that she quit smoking about 38 years ago. Her smoking use included cigarettes. She has a 20.00 pack-year smoking history. She has never used smokeless tobacco. She reports that she does not drink alcohol and does not use drugs. ? ?Allergies:  ?Allergies  ?Allergen Reactions  ? Prednisone Other (See Comments)  ?  Severe depression  ? ? ?(Not in a hospital admission) ? ? ?No results found for this or any previous visit (from the past 48 hour(s)). ?No results  found. ? ?Review of Systems  ?Constitutional: Negative.   ?HENT: Negative.    ?Eyes: Negative.   ?Respiratory: Negative.    ?Cardiovascular: Negative.   ?Gastrointestinal: Negative.   ?Musculoskeletal: Negative.   ?Skin: Negative.   ?Neurological: Negative.   ?Psychiatric/Behavioral: Negative.    ?All other systems reviewed and are negative. ? ?Blood pressure 132/80, pulse 65, temperature 97.8 ?F (36.6 ?C), temperature source Tympanic, resp. rate (!) 22, height $RemoveBe'5\' 2"'JmPmBinnS$  (1.575 m), weight 72.6 kg, SpO2 91 %. ?Physical Exam ?Vitals reviewed.  ?Constitutional:   ?   Appearance: She is well-developed.  ?HENT:  ?   Head: Normocephalic and atraumatic.  ?Eyes:  ?   Conjunctiva/sclera: Conjunctivae normal.  ?   Pupils: Pupils are equal, round, and reactive to light.  ?Cardiovascular:  ?   Heart sounds: Normal heart sounds.  ?Pulmonary:  ?   Effort: Pulmonary effort is normal.  ?Abdominal:  ?   Palpations: Abdomen is soft.  ?Musculoskeletal:     ?   General: Normal range of motion.  ?   Cervical back: Normal range of motion.  ?Skin: ?   General: Skin is warm and dry.  ?Neurological:  ?   General: No focal deficit present.  ?   Mental Status: She is alert.  ?Psychiatric:     ?   Attention and Perception: Attention normal.     ?   Mood and Affect: Mood is depressed.     ?   Speech: Speech is delayed.     ?  Behavior: Behavior is slowed.     ?   Thought Content: Thought content does not include suicidal ideation.  ?  ? ?Assessment/Plan ?This is treatment #2 today and we plan an index course until we see returned to baseline ? ?Alethia Berthold, MD ?08/25/2021, 5:50 PM ? ? ? ?

## 2021-08-25 NOTE — Procedures (Signed)
ECT SERVICES ?Physician?s Interval Evaluation & Treatment Note ? ?Patient Identification: Carmen [name] ?MRN:  [id number] ?Date of Evaluation:  08/25/2021 ?[place] #: 2 ? ?MADRS:  ? ?MMSE:  ? ?P.E. Findings: ? ?No change to physical exam ? ?Psychiatric Interval Note: ? ?Possibly a little more energetic but nothing dramatic ? ?Subjective: ? ?Patient is a 71 y.o. female seen for evaluation for Electroconvulsive Therapy. ?Minimal change ? ?Treatment Summary: ? ? '[]'$   Right Unilateral             '[x]'$  Bilateral ?  ?% Energy : 1.0 ms 40% ? ? Impedance: 1190 ohms ? ?Seizure Energy Index: 15,355 ?V squared ? ?Postictal Suppression Index: 81% ? ?Seizure Concordance Index: 89% ? ?Medications ? ?Pre Shock: Brevital 70 mg succinylcholine 70 mg ? ?Post Shock:   ? ?Seizure Duration: 46 seconds EMG 74 seconds EEG ? ? ?Comments: ?Better recovery with lower dose of succinylcholine.  Follow-up Friday ? ?Lungs: ? ?'[x]'$   Clear to auscultation              ? ?'[]'$  Other:  ? ?Heart: ?  ? '[x]'$   Regular rhythm             '[]'$  irregular rhythm ? ? ? '[x]'$   Previous H&P reviewed, patient examined and there are NO CHANGES              ?  ? '[]'$   Previous H&P reviewed, patient examined and there are changes noted. ? ? ?Carmen Berthold, MD ?5/10/20236:04 PM ? ?  ?

## 2021-08-25 NOTE — Transfer of Care (Signed)
Immediate Anesthesia Transfer of Care Note ? ?Patient: Darl Brisbin ? ?Procedure(s) Performed: ECT TX ? ?Patient Location: PACU ? ?Anesthesia Type:General ? ?Level of Consciousness: drowsy ? ?Airway & Oxygen Therapy: Patient Spontanous Breathing and Patient connected to nasal cannula oxygen ? ?Post-op Assessment: Report given to RN, Post -op Vital signs reviewed and stable and Patient moving all extremities ? ?Post vital signs: Reviewed and stable ? ?Last Vitals:  ?Vitals Value Taken Time  ?BP    ?Temp    ?Pulse    ?Resp    ?SpO2    ? ? ?Last Pain:  ?Vitals:  ? 08/25/21 1230  ?TempSrc:   ?PainSc: 0-No pain  ?   ? ?  ? ?Complications: No notable events documented. ?

## 2021-08-26 ENCOUNTER — Other Ambulatory Visit: Payer: Self-pay | Admitting: Psychiatry

## 2021-08-27 ENCOUNTER — Ambulatory Visit: Payer: Self-pay | Admitting: Certified Registered"

## 2021-08-27 ENCOUNTER — Encounter (HOSPITAL_BASED_OUTPATIENT_CLINIC_OR_DEPARTMENT_OTHER)
Admission: RE | Admit: 2021-08-27 | Discharge: 2021-08-27 | Disposition: A | Discharge status: Home / Self Care | Payer: Medicare Other | Source: Ambulatory Visit | Attending: Psychiatry | Admitting: Psychiatry

## 2021-08-27 DIAGNOSIS — F338 Other recurrent depressive disorders: Secondary | ICD-10-CM | POA: Diagnosis not present

## 2021-08-27 DIAGNOSIS — F332 Major depressive disorder, recurrent severe without psychotic features: Secondary | ICD-10-CM

## 2021-08-27 MED ORDER — METHOHEXITAL SODIUM 100 MG/10ML IV SOSY
PREFILLED_SYRINGE | INTRAVENOUS | Status: DC | PRN
  Administered 2021-08-27: 70 mg via INTRAVENOUS

## 2021-08-27 MED ORDER — METHOHEXITAL SODIUM 0.5 G IJ SOLR
INTRAMUSCULAR | Status: AC
  Filled 2021-08-27: qty 500

## 2021-08-27 MED ORDER — SUCCINYLCHOLINE CHLORIDE 200 MG/10ML IV SOSY
PREFILLED_SYRINGE | INTRAVENOUS | Status: DC | PRN
  Administered 2021-08-27: 70 mg via INTRAVENOUS

## 2021-08-27 MED ORDER — SODIUM CHLORIDE 0.9 % IV SOLN: 500.0000 mL | Freq: Once | INTRAVENOUS | Status: AC

## 2021-08-27 NOTE — Anesthesia Postprocedure Evaluation (Signed)
Anesthesia Post Note ? ?Patient: Carmen [name] ? ?Procedure(s) Performed: ECT TX ? ?Patient location during evaluation: PACU ?Anesthesia Type: General ?Level of consciousness: awake and oriented ?Pain management: pain level controlled ?Vital Signs Assessment: post-procedure vital signs reviewed and stable ?Respiratory status: spontaneous breathing ?Cardiovascular status: stable ?Anesthetic complications: no ? ? ?No notable events documented. ? ? ?Last Vitals:  ?Vitals:  ? 08/27/21 1250 08/27/21 1300  ?BP: (!) 129/112 (!) 115/59  ?Pulse: (!) 58 (!) 50  ?Resp:  17  ?Temp:  (!) 36.1 ?C  ?SpO2:  94%  ?  ?Last Pain:  ?Vitals:  ? 08/27/21 1300  ?TempSrc:   ?PainSc: 0-No pain  ? ? ?  ?  ?  ?  ?  ?  ? ?VAN STAVEREN,Lileigh Fahringer ? ? ? ? ?

## 2021-08-27 NOTE — Procedures (Signed)
ECT SERVICES ?Physician?s Interval Evaluation & Treatment Note ? ?Patient Identification: Carmen [name] ?MRN:  [id number] ?Date of Evaluation:  08/27/2021 ?[place] #: 3 ? ?MADRS:  ? ?MMSE:  ? ?P.E. Findings: ? ?No change to physical exam ? ?Psychiatric Interval Note: ? ?Mood possibly slightly better but seems to be having a fair bit of memory issues ? ?Subjective: ? ?Patient is a 71 y.o. female seen for evaluation for Electroconvulsive Therapy. ?No complaint ? ?Treatment Summary: ? ? '[]'$   Right Unilateral             '[x]'$  Bilateral ?  ?% Energy : 1.0 ms 40% ? ? Impedance: 1580 ohms ? ?Seizure Energy Index: 12,865 ?V squared ? ?Postictal Suppression Index: 87% ? ?Seizure Concordance Index: 94% ? ?Medications ? ?Pre Shock: Brevital 70 mg succinylcholine 70 mg ? ?Post Shock:   ? ?Seizure Duration: 35 seconds EMG 58 seconds EEG ? ? ?Comments: ?Next treatment Monday ? ?Lungs: ? ?'[x]'$   Clear to auscultation              ? ?'[]'$  Other:  ? ?Heart: ?  ? '[x]'$   Regular rhythm             '[]'$  irregular rhythm ? ? ? '[x]'$   Previous H&P reviewed, patient examined and there are NO CHANGES              ?  ? '[]'$   Previous H&P reviewed, patient examined and there are changes noted. ? ? ?Alethia Berthold, MD ?5/12/20233:51 PM ? ? ? ?

## 2021-08-27 NOTE — H&P (Signed)
Carmen [name] is an 71 y.o. female.   ?Chief Complaint: Patient still feels no change.  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 ?Past Medical History:  ?Diagnosis Date  ? Anxiety   ? Arthritis   ? back  ? Depression   ? Hypertension   ? controlled on meds  ? Wears partial dentures   ? upper  ? ? ?Past Surgical History:  ?Procedure Laterality Date  ? ABDOMINAL HYSTERECTOMY    ? CATARACT EXTRACTION W/PHACO Right 03/21/2016  ? Procedure: CATARACT EXTRACTION PHACO AND INTRAOCULAR LENS PLACEMENT (IOC);  Surgeon: Ronnell Freshwater, MD;  Location: [place];  Service: Ophthalmology;  Laterality: Right;  RIGHT  ? CATARACT EXTRACTION W/PHACO Left 05/09/2016  ? Procedure: CATARACT EXTRACTION PHACO AND INTRAOCULAR LENS PLACEMENT ([place])  Left;  Surgeon: Ronnell Freshwater, MD;  Location: [place];  Service: Ophthalmology;  Laterality: Left;  ? CERVICAL SPINE SURGERY  2009  ? discectomy  ? COLONOSCOPY    ? KYPHOPLASTY N/A 01/18/2018  ? Procedure: [id number];  Surgeon: Hessie Knows, MD;  Location: [facility];  Service: Orthopedics;  Laterality: N/A;  ? LUMBAR LAMINECTOMY/ DECOMPRESSION WITH MET-RX Left 04/22/2019  ? Procedure: LEFT LATERAL L4-5 DISCECTOMY WITH METRX;  Surgeon: [name], MD;  Location: [facility];  Service: Neurosurgery;  Laterality: Left;  ? ? ?Family History  ?Problem Relation Age of Onset  ? Breast cancer Sister 27  ? ?Social History:  reports that she quit smoking about 38 years ago. Her smoking use included cigarettes. She has a 20.00 pack-year smoking history. She has never used smokeless tobacco. She reports that she does not drink alcohol and does not use drugs. ? ?Allergies:  ?Allergies  ?Allergen Reactions  ? Prednisone Other (See Comments)  ?  Severe depression  ? ? ?(Not in a hospital admission) ? ? ?No results found for this or any previous visit (from the past 48 hour(s)). ?No results  found. ? ?Review of Systems  ?Constitutional: Negative.   ?HENT: Negative.    ?Eyes: Negative.   ?Respiratory: Negative.    ?Cardiovascular: Negative.   ?Gastrointestinal: Negative.   ?Musculoskeletal: Negative.   ?Skin: Negative.   ?Neurological: Negative.   ?Psychiatric/Behavioral:  Positive for dysphoric mood.   ? ?Blood pressure 117/62, pulse (!) 48, temperature (!) 97.1 ?F (36.2 ?C), temperature source Tympanic, resp. rate 18, height $RemoveBe'5\' 2"'jegWbEPjz$  (1.575 m), weight 72.6 kg, SpO2 94 %. ?Physical Exam ?Vitals reviewed.  ?Constitutional:   ?   Appearance: She is well-developed.  ?HENT:  ?   Head: Normocephalic and atraumatic.  ?Eyes:  ?   Conjunctiva/sclera: Conjunctivae normal.  ?   Pupils: Pupils are equal, round, and reactive to light.  ?Cardiovascular:  ?   Heart sounds: Normal heart sounds.  ?Pulmonary:  ?   Effort: Pulmonary effort is normal.  ?Abdominal:  ?   Palpations: Abdomen is soft.  ?Musculoskeletal:     ?   General: Normal range of motion.  ?   Cervical back: Normal range of motion.  ?Skin: ?   General: Skin is warm and dry.  ?Neurological:  ?   General: No focal deficit present.  ?   Mental Status: She is alert.  ?Psychiatric:     ?   Attention and Perception: She is inattentive.     ?   Mood and Affect: Mood is depressed. Affect is blunt.     ?   Speech: Speech is delayed.     ?  Thought Content: Thought content does not include suicidal ideation.  ?  ? ?Assessment/Plan ?Plan to continue treatment with bilateral treatment today and continue into Monday.  Supportive therapy and psychoeducation completed. ? ?Alethia Berthold, MD ?08/27/2021, 3:41 PM ? ? ? ?

## 2021-08-27 NOTE — Transfer of Care (Signed)
Immediate Anesthesia Transfer of Care Note ? ?Patient: Carmen [name] ? ?Procedure(s) Performed: ECT TX ? ?Patient Location: PACU ? ?Anesthesia Type:General ? ?Level of Consciousness: drowsy ? ?Airway & Oxygen Therapy: Patient Spontanous Breathing and Patient connected to nasal cannula oxygen ? ?Post-op Assessment: Report given to RN ? ?Post vital signs: stable ? ?Last Vitals:  ?Vitals Value Taken Time  ?BP    ?Temp    ?Pulse    ?Resp    ?SpO2    ? ? ?Last Pain:  ?Vitals:  ? 08/27/21 1037  ?TempSrc:   ?PainSc: 0-No pain  ?   ? ?  ? ?Complications: No notable events documented. ?

## 2021-08-27 NOTE — Anesthesia Preprocedure Evaluation (Signed)
Anesthesia Evaluation  ?Patient identified by MRN, date of birth, ID band ?Patient awake ? ? ? ?Reviewed: ?Allergy & Precautions, NPO status , Patient's Chart, lab work & pertinent test results ? ?History of Anesthesia Complications ?Negative for: history of anesthetic complications ? ?Airway ?Mallampati: III ? ?TM Distance: >3 FB ?Neck ROM: full ? ? ? Dental ? ?(+) Teeth Intact, Partial Upper, Partial Lower, Dental Advisory Given ?  ?Pulmonary ?neg pulmonary ROS, former smoker,  ?  ?Pulmonary exam normal ?breath sounds clear to auscultation ? ? ? ? ? ? Cardiovascular ?Exercise Tolerance: Good ?hypertension, Pt. on medications ?negative cardio ROS ?Normal cardiovascular exam ?Rhythm:Regular Rate:Normal ? ? ?  ?Neuro/Psych ?Anxiety Depression negative neurological ROS ? negative psych ROS  ? GI/Hepatic ?negative GI ROS, Neg liver ROS,   ?Endo/Other  ?negative endocrine ROS ? Renal/GU ?  ?negative genitourinary ?  ?Musculoskeletal ? ? Abdominal ?Normal abdominal exam  (+)   ?Peds ?negative pediatric ROS ?(+)  Hematology ?negative hematology ROS ?(+)   ?Anesthesia Other Findings ?Past Medical History: ?No date: Anxiety ?No date: Arthritis ?    Comment:  back ?No date: Depression ?No date: Hypertension ?    Comment:  controlled on meds ?No date: Wears partial dentures ?    Comment:  upper ? ?Past Surgical History: ?No date: ABDOMINAL HYSTERECTOMY ?03/21/2016: CATARACT EXTRACTION W/PHACO; Right ?    Comment:  Procedure: CATARACT EXTRACTION PHACO AND INTRAOCULAR  ?             LENS PLACEMENT ([place]);  Surgeon: Ronnell Freshwater, ?             MD;  Location: [place];  Service:  ?             Ophthalmology;  Laterality: Right;  RIGHT ?05/09/2016: CATARACT EXTRACTION W/PHACO; Left ?    Comment:  Procedure: CATARACT EXTRACTION PHACO AND INTRAOCULAR  ?             LENS PLACEMENT ([place])  Left;  Surgeon: Deirdre Peer  ?             Vin-Parikh, MD;  Location: [place];   Service: ?             Ophthalmology;  Laterality: Left; ?2009: CERVICAL SPINE SURGERY ?    Comment:  discectomy ?No date: COLONOSCOPY ?01/18/2018: KYPHOPLASTY; N/A ?    Comment:  Procedure: KYPHOPLASTY-L1;  Surgeon: Hessie Knows, MD;  ?             Location: [facility];  Service: Orthopedics;  Laterality:  ?             N/A; ?04/22/2019: LUMBAR LAMINECTOMY/ DECOMPRESSION WITH MET-RX; Left ?    Comment:  Procedure: LEFT LATERAL L4-5 DISCECTOMY WITH METRX;   ?             Surgeon: [name], MD;  Location: [facility];  Service: ?             Neurosurgery;  Laterality: Left; ? ?BMI   ? Body Mass Index: 29.26 kg/m?  ?  ? ? Reproductive/Obstetrics ?negative OB ROS ? ?  ? ? ? ? ? ? ? ? ? ? ? ? ? ?  ?  ? ? ? ? ? ? ? ? ?Anesthesia Physical ?Anesthesia Plan ? ?ASA: 3 ? ?Anesthesia Plan: General  ? ?Post-op Pain Management:   ? ?Induction: Intravenous ? ?PONV Risk Score and Plan:  ? ?Airway Management Planned: Natural Airway and Nasal  Cannula ? ?Additional Equipment:  ? ?Intra-op Plan:  ? ?Post-operative Plan:  ? ?Informed Consent: I have reviewed the patients History and Physical, chart, labs and discussed the procedure including the risks, benefits and alternatives for the proposed anesthesia with the patient or authorized representative who has indicated his/her understanding and acceptance.  ? ? ? ? ? ?Plan Discussed with: CRNA and Surgeon ? ?Anesthesia Plan Comments:   ? ? ? ? ? ? ?Anesthesia Quick Evaluation ? ?

## 2021-08-30 ENCOUNTER — Encounter: Payer: Self-pay | Admitting: Certified Registered Nurse Anesthetist

## 2021-08-30 ENCOUNTER — Encounter (HOSPITAL_BASED_OUTPATIENT_CLINIC_OR_DEPARTMENT_OTHER)
Admission: RE | Admit: 2021-08-30 | Discharge: 2021-08-30 | Disposition: A | Discharge status: Home / Self Care | Payer: Medicare Other | Source: Ambulatory Visit | Attending: Psychiatry | Admitting: Psychiatry

## 2021-08-30 DIAGNOSIS — F338 Other recurrent depressive disorders: Secondary | ICD-10-CM | POA: Diagnosis not present

## 2021-08-30 DIAGNOSIS — F332 Major depressive disorder, recurrent severe without psychotic features: Secondary | ICD-10-CM

## 2021-08-30 MED ORDER — SODIUM CHLORIDE 0.9 % IV SOLN: INTRAVENOUS | Status: DC | PRN

## 2021-08-30 MED ORDER — SUCCINYLCHOLINE CHLORIDE 200 MG/10ML IV SOSY
PREFILLED_SYRINGE | INTRAVENOUS | Status: AC
  Filled 2021-08-30: qty 30

## 2021-08-30 MED ORDER — METHOHEXITAL SODIUM 100 MG/10ML IV SOSY
PREFILLED_SYRINGE | INTRAVENOUS | Status: DC | PRN
Start: 2021-08-30 — End: 2021-08-30
  Administered 2021-08-30: 70 mg via INTRAVENOUS

## 2021-08-30 MED ORDER — METHOHEXITAL SODIUM 0.5 G IJ SOLR
INTRAMUSCULAR | Status: AC
  Filled 2021-08-30: qty 500

## 2021-08-30 MED ORDER — LABETALOL HCL 5 MG/ML IV SOLN
INTRAVENOUS | Status: AC
  Filled 2021-08-30: qty 4

## 2021-08-30 MED ORDER — SUCCINYLCHOLINE CHLORIDE 200 MG/10ML IV SOSY
PREFILLED_SYRINGE | INTRAVENOUS | Status: DC | PRN
  Administered 2021-08-30: 70 mg via INTRAVENOUS

## 2021-08-30 NOTE — H&P (Signed)
Carmen [name] is an 70 y.o. female.   ?Chief Complaint: Patient and husband agree that she had an excellent weekend with essentially a return to all of her normal mood and thinking states.  Minimal memory problem. ?HPI: History of recurrent depression with some improvement with ECT ? ?Past Medical History:  ?Diagnosis Date  ? Anxiety   ? Arthritis   ? back  ? Depression   ? Hypertension   ? controlled on meds  ? Wears partial dentures   ? upper  ? ? ?Past Surgical History:  ?Procedure Laterality Date  ? ABDOMINAL HYSTERECTOMY    ? CATARACT EXTRACTION W/PHACO Right 03/21/2016  ? Procedure: CATARACT EXTRACTION PHACO AND INTRAOCULAR LENS PLACEMENT ([facility]);  Surgeon: Ronnell Freshwater, MD;  Location: [place];  Service: Ophthalmology;  Laterality: Right;  RIGHT  ? CATARACT EXTRACTION W/PHACO Left 05/09/2016  ? Procedure: CATARACT EXTRACTION PHACO AND INTRAOCULAR LENS PLACEMENT ([place])  Left;  Surgeon: Ronnell Freshwater, MD;  Location: [place];  Service: Ophthalmology;  Laterality: Left;  ? CERVICAL SPINE SURGERY  2009  ? discectomy  ? COLONOSCOPY    ? KYPHOPLASTY N/A 01/18/2018  ? Procedure: [id number];  Surgeon: Hessie Knows, MD;  Location: [facility];  Service: Orthopedics;  Laterality: N/A;  ? LUMBAR LAMINECTOMY/ DECOMPRESSION WITH MET-RX Left 04/22/2019  ? Procedure: LEFT LATERAL L4-5 DISCECTOMY WITH METRX;  Surgeon: [name], MD;  Location: [facility];  Service: Neurosurgery;  Laterality: Left;  ? ? ?Family History  ?Problem Relation Age of Onset  ? Breast cancer Sister 85  ? ?Social History:  reports that she quit smoking about 38 years ago. Her smoking use included cigarettes. She has a 20.00 pack-year smoking history. She has never used smokeless tobacco. She reports that she does not drink alcohol and does not use drugs. ? ?Allergies:  ?Allergies  ?Allergen Reactions  ? Prednisone Other (See Comments)  ?  Severe depression  ? ? ?(Not in a hospital admission) ? ? ?No  results found for this or any previous visit (from the past 48 hour(s)). ?No results found. ? ?Review of Systems  ?Constitutional: Negative.   ?HENT: Negative.    ?Eyes: Negative.   ?Respiratory: Negative.    ?Cardiovascular: Negative.   ?Gastrointestinal: Negative.   ?Musculoskeletal: Negative.   ?Skin: Negative.   ?Neurological: Negative.   ?Psychiatric/Behavioral: Negative.    ?All other systems reviewed and are negative. ? ?Blood pressure 123/62, pulse (!) 40, temperature (!) 97.3 ?F (36.3 ?C), temperature source Tympanic, resp. rate 18, height $RemoveBe'5\' 2"'HeRRgqcPD$  (1.575 m), weight 72.6 kg, SpO2 94 %. ?Physical Exam ?Vitals and nursing note reviewed.  ?Constitutional:   ?   Appearance: She is well-developed.  ?HENT:  ?   Head: Normocephalic and atraumatic.  ?Eyes:  ?   Conjunctiva/sclera: Conjunctivae normal.  ?   Pupils: Pupils are equal, round, and reactive to light.  ?Cardiovascular:  ?   Heart sounds: Normal heart sounds.  ?Pulmonary:  ?   Effort: Pulmonary effort is normal.  ?Abdominal:  ?   Palpations: Abdomen is soft.  ?Musculoskeletal:     ?   General: Normal range of motion.  ?   Cervical back: Normal range of motion.  ?Skin: ?   General: Skin is warm and dry.  ?Neurological:  ?   General: No focal deficit present.  ?   Mental Status: She is alert.  ?Psychiatric:     ?   Mood and Affect: Mood normal.  ?  ? ?Assessment/Plan ?  ECT today and then we will end the index course.  I have asked her to come back in about 2 weeks if she is starting to feel down again. ? ?Alethia Berthold, MD ?08/30/2021, 5:25 PM ? ? ? ?

## 2021-08-30 NOTE — Anesthesia Preprocedure Evaluation (Signed)
Anesthesia Evaluation  ?Patient identified by MRN, date of birth, ID band ?Patient awake ? ? ? ?Reviewed: ?Allergy & Precautions, NPO status , Patient's Chart, lab work & pertinent test results, reviewed documented beta blocker date and time  ? ?History of Anesthesia Complications ?Negative for: history of anesthetic complications ? ?Airway ?Mallampati: III ? ?TM Distance: >3 FB ?Neck ROM: Full ? ? ? Dental ? ?(+) Partial Upper, Dental Advisory Given, Partial Lower, Teeth Intact ?  ?Pulmonary ?neg pulmonary ROS, Patient abstained from smoking., former smoker,  ?  ?breath sounds clear to auscultation ? ? ? ? ? ? Cardiovascular ?Exercise Tolerance: Good ?hypertension, Pt. on medications and Pt. on home beta blockers ?(-) Past MI  ?Rhythm:Regular Rate:Normal ? ? ?  ?Neuro/Psych ?PSYCHIATRIC DISORDERS Anxiety Depression negative neurological ROS ?   ? GI/Hepatic ?negative GI ROS, Neg liver ROS,   ?Endo/Other  ?negative endocrine ROS ? Renal/GU ?Renal InsufficiencyRenal disease  ? ?  ?Musculoskeletal ? ?(+) Arthritis , Osteoarthritis,   ? Abdominal ?Normal abdominal exam  (+)   ?Peds ? Hematology ?  ?Anesthesia Other Findings ?Past Medical History: ?No date: Anxiety ?No date: Arthritis ?    Comment:  back ?No date: Depression ?No date: Hypertension ?    Comment:  controlled on meds ?No date: Wears partial dentures ?    Comment:  upper ? ? Reproductive/Obstetrics ? ?  ? ? ? ? ? ? ? ? ? ? ? ? ? ?  ?  ? ? ? ? ? ? ? ? ?Anesthesia Physical ? ?Anesthesia Plan ? ?ASA: 2 ? ?Anesthesia Plan: General  ? ?Post-op Pain Management:   ? ?Induction: Intravenous ? ?PONV Risk Score and Plan: 4 or greater and TIVA and Propofol infusion ? ?Airway Management Planned: Natural Airway and Mask ? ?Additional Equipment: None ? ?Intra-op Plan:  ? ?Post-operative Plan:  ? ?Informed Consent: I have reviewed the patients History and Physical, chart, labs and discussed the procedure including the risks, benefits and  alternatives for the proposed anesthesia with the patient or authorized representative who has indicated his/her understanding and acceptance.  ? ? ? ?Dental advisory given ? ?Plan Discussed with: CRNA and Surgeon ? ?Anesthesia Plan Comments: (Discussed risks of anesthesia with patient, including PONV, muscle aches. Rare risks discussed as well, such as cardiorespiratory sequelae, need for airway intervention and its associated risks including lip/dental/eye damage and sore throat, and allergic reactions. Discussed the role of CRNA in patient's perioperative care. Patient understands.)  ? ? ? ? ? ? ?Anesthesia Quick Evaluation ? ?

## 2021-08-30 NOTE — Procedures (Signed)
ECT SERVICES ?Physician?s Interval Evaluation & Treatment Note ? ?Patient Identification: Carmen [name] ?MRN:  [id number] ?Date of Evaluation:  08/30/2021 ?[place] #: 4 ? ?MADRS:  ? ?MMSE:  ? ?P.E. Findings: ? ?No change to physical exam ? ?Psychiatric Interval Note: ? ?It is agreed by the patient and her husband that she is feeling much better and had a normal weekend ? ?Subjective: ? ?Patient is a 71 y.o. female seen for evaluation for Electroconvulsive Therapy. ?Feeling better with only minimal memory problems ? ?Treatment Summary: ? ? '[]'$   Right Unilateral             '[x]'$  Bilateral ?  ?% Energy : 1.0 ms 40% ? ? Impedance: 1380 ohms ? ?Seizure Energy Index: No reading ? ?Postictal Suppression Index: No reading ? ?Seizure Concordance Index: 91% ? ?Medications ? ?Pre Shock: Brevital 70 mg succinylcholine 70 mg ? ?Post Shock:   ? ?Seizure Duration: 14 seconds EMG 40 seconds EEG ? ? ?Comments: ?We will terminate the index course after this treatment and see her back in 2 weeks if she is not feeling well and needs to return ? ?Lungs: ? ?'[x]'$   Clear to auscultation              ? ?'[]'$  Other:  ? ?Heart: ?  ? '[x]'$   Regular rhythm             '[]'$  irregular rhythm ? ? ? '[x]'$   Previous H&P reviewed, patient examined and there are NO CHANGES              ?  ? '[]'$   Previous H&P reviewed, patient examined and there are changes noted. ? ? ?Alethia Berthold, MD ?5/15/20235:26 PM ? ?  ?

## 2021-08-30 NOTE — Transfer of Care (Signed)
Immediate Anesthesia Transfer of Care Note ? ?Patient: Carmen [name] ? ?Procedure(s) Performed: ECT TX ? ?Patient Location: PACU ? ?Anesthesia Type:General ? ?Level of Consciousness: drowsy ? ?Airway & Oxygen Therapy: Patient Spontanous Breathing and Patient connected to face mask oxygen ? ?Post-op Assessment: Report given to RN and Post -op Vital signs reviewed and stable ? ?Post vital signs: Reviewed and stable ? ?Last Vitals:  ?Vitals Value Taken Time  ?BP 91/44   ?Temp    ?Pulse 42   ?Resp 15   ?SpO2 98   ? ? ?Last Pain:  ?Vitals:  ? 08/30/21 1040  ?TempSrc:   ?PainSc: 0-No pain  ?   ? ?  ? ?Complications: No notable events documented. ?

## 2021-08-30 NOTE — Anesthesia Procedure Notes (Signed)
Procedure Name: General with mask airway ?Date/Time: 08/30/2021 11:37 AM ?Performed by: Lily Peer, Jeanette Moffatt, CRNA ?Pre-anesthesia Checklist: Patient identified, Emergency Drugs available, Suction available, Patient being monitored and Timeout performed ?Patient Re-evaluated:Patient Re-evaluated prior to induction ?Oxygen Delivery Method: Circle system utilized ?Preoxygenation: Pre-oxygenation with 100% oxygen ?Induction Type: IV induction ?Ventilation: Mask ventilation throughout procedure ? ? ? ? ?

## 2021-08-31 NOTE — Anesthesia Postprocedure Evaluation (Signed)
Anesthesia Post Note ? ?Patient: Carmen [name] ? ?Procedure(s) Performed: ECT TX ? ?Patient location during evaluation: PACU ?Anesthesia Type: General ?Level of consciousness: awake and alert ?Pain management: pain level controlled ?Vital Signs Assessment: post-procedure vital signs reviewed and stable ?Respiratory status: spontaneous breathing, nonlabored ventilation, respiratory function stable and patient connected to nasal cannula oxygen ?Cardiovascular status: blood pressure returned to baseline and stable ?Postop Assessment: no apparent nausea or vomiting ?Anesthetic complications: no ? ? ?No notable events documented. ? ? ?Last Vitals:  ?Vitals:  ? 08/30/21 1220 08/30/21 1230  ?BP: (!) 120/54 123/62  ?Pulse: (!) 44 (!) 40  ?Resp: 20 18  ?Temp:  (!) 36.3 ?C  ?SpO2: 94%   ?  ?Last Pain:  ?Vitals:  ? 08/30/21 1230  ?TempSrc: Tympanic  ?PainSc: 0-No pain  ? ? ?  ?  ?  ?  ?  ?  ? ?Martha Clan ? ? ? ? ?

## 2021-09-06 ENCOUNTER — Other Ambulatory Visit: Payer: Self-pay | Admitting: Internal Medicine

## 2021-09-06 DIAGNOSIS — Z1231 Encounter for screening mammogram for malignant neoplasm of breast: Secondary | ICD-10-CM

## 2021-10-04 ENCOUNTER — Ambulatory Visit
Admission: RE | Admit: 2021-10-04 | Discharge: 2021-10-04 | Disposition: A | Discharge status: Home / Self Care | Payer: Medicare Other | Source: Ambulatory Visit | Attending: Internal Medicine | Admitting: Internal Medicine

## 2021-10-04 DIAGNOSIS — Z1231 Encounter for screening mammogram for malignant neoplasm of breast: Secondary | ICD-10-CM | POA: Insufficient documentation

## 2021-12-23 ENCOUNTER — Other Ambulatory Visit: Payer: Self-pay | Admitting: Internal Medicine

## 2021-12-23 DIAGNOSIS — R270 Ataxia, unspecified: Secondary | ICD-10-CM

## 2022-01-06 ENCOUNTER — Ambulatory Visit
Admission: RE | Admit: 2022-01-06 | Discharge: 2022-01-06 | Disposition: A | Discharge status: Home / Self Care | Payer: Medicare Other | Source: Ambulatory Visit | Attending: Internal Medicine | Admitting: Internal Medicine

## 2022-01-06 DIAGNOSIS — R270 Ataxia, unspecified: Secondary | ICD-10-CM | POA: Insufficient documentation

## 2022-01-24 ENCOUNTER — Encounter: Payer: Self-pay | Admitting: *Deleted

## 2022-01-25 ENCOUNTER — Ambulatory Visit: Payer: Medicare Other | Admitting: Anesthesiology

## 2022-01-25 ENCOUNTER — Encounter: Payer: Self-pay | Admitting: *Deleted

## 2022-01-25 ENCOUNTER — Ambulatory Visit
Admission: RE | Admit: 2022-01-25 | Discharge: 2022-01-25 | Disposition: A | Discharge status: Home / Self Care | Payer: Medicare Other | Attending: Gastroenterology | Admitting: Gastroenterology

## 2022-01-25 ENCOUNTER — Encounter
Admission: RE | Disposition: A | Discharge status: Home / Self Care | Payer: Self-pay | Source: Home / Self Care | Attending: Gastroenterology

## 2022-01-25 DIAGNOSIS — D120 Benign neoplasm of cecum: Secondary | ICD-10-CM | POA: Diagnosis not present

## 2022-01-25 DIAGNOSIS — I10 Essential (primary) hypertension: Secondary | ICD-10-CM | POA: Insufficient documentation

## 2022-01-25 DIAGNOSIS — F419 Anxiety disorder, unspecified: Secondary | ICD-10-CM | POA: Insufficient documentation

## 2022-01-25 DIAGNOSIS — Z1211 Encounter for screening for malignant neoplasm of colon: Secondary | ICD-10-CM | POA: Diagnosis present

## 2022-01-25 HISTORY — PX: COLONOSCOPY WITH PROPOFOL: SHX5780

## 2022-01-25 SURGERY — COLONOSCOPY WITH PROPOFOL
Anesthesia: General

## 2022-01-25 MED ORDER — PROPOFOL 10 MG/ML IV BOLUS
INTRAVENOUS | Status: DC | PRN
  Administered 2022-01-25: 40 mg via INTRAVENOUS

## 2022-01-25 MED ORDER — SODIUM CHLORIDE 0.9 % IV SOLN: INTRAVENOUS | Status: DC

## 2022-01-25 MED ORDER — LIDOCAINE HCL (CARDIAC) PF 100 MG/5ML IV SOSY
PREFILLED_SYRINGE | INTRAVENOUS | Status: DC | PRN
  Administered 2022-01-25: 60 mg via INTRAVENOUS

## 2022-01-25 MED ORDER — PROPOFOL 500 MG/50ML IV EMUL
INTRAVENOUS | Status: DC | PRN
  Administered 2022-01-25: 50 ug/kg/min via INTRAVENOUS

## 2022-01-25 NOTE — Anesthesia Preprocedure Evaluation (Signed)
Anesthesia Evaluation  Patient identified by MRN, date of birth, ID band Patient awake    Reviewed: Allergy & Precautions, NPO status , Patient's Chart, lab work & pertinent test results  History of Anesthesia Complications Negative for: history of anesthetic complications  Airway Mallampati: III  TM Distance: >3 FB Neck ROM: full    Dental  (+) Partial Upper   Pulmonary neg pulmonary ROS, former smoker,    Pulmonary exam normal        Cardiovascular hypertension, On Medications and On Home Beta Blockers negative cardio ROS Normal cardiovascular exam     Neuro/Psych PSYCHIATRIC DISORDERS Anxiety Depression negative neurological ROS     GI/Hepatic negative GI ROS, Neg liver ROS,   Endo/Other  negative endocrine ROS  Renal/GU negative Renal ROS  negative genitourinary   Musculoskeletal  (+) Arthritis , Osteoarthritis,    Abdominal   Peds  Hematology negative hematology ROS (+)   Anesthesia Other Findings Past Medical History: No date: Anxiety No date: Arthritis     Comment:  back No date: Depression No date: Hypertension     Comment:  controlled on meds No date: Wears partial dentures     Comment:  upper  Past Surgical History: No date: ABDOMINAL HYSTERECTOMY 03/21/2016: CATARACT EXTRACTION W/PHACO; Right     Comment:  Procedure: CATARACT EXTRACTION PHACO AND INTRAOCULAR               LENS PLACEMENT ([place]);  Surgeon: Ronnell Freshwater,              MD;  Location: [place];  Service:               Ophthalmology;  Laterality: Right;  RIGHT 05/09/2016: CATARACT EXTRACTION W/PHACO; Left     Comment:  Procedure: CATARACT EXTRACTION PHACO AND INTRAOCULAR               LENS PLACEMENT ([place])  Left;  Surgeon: Ronnell Freshwater, MD;  Location: [place];  Service:              Ophthalmology;  Laterality: Left; 2009: CERVICAL SPINE SURGERY     Comment:   discectomy No date: COLONOSCOPY 01/18/2018: KYPHOPLASTY; N/A     Comment:  Procedure: KYPHOPLASTY-L1;  Surgeon: Hessie Knows, MD;               Location: [facility];  Service: Orthopedics;  Laterality:               N/A; 04/22/2019: LUMBAR LAMINECTOMY/ DECOMPRESSION WITH MET-RX; Left     Comment:  Procedure: LEFT LATERAL L4-5 DISCECTOMY WITH METRX;                Surgeon: [name], MD;  Location: [facility];  Service:              Neurosurgery;  Laterality: Left;     Reproductive/Obstetrics negative OB ROS                             Anesthesia Physical Anesthesia Plan  ASA: 2  Anesthesia Plan: General   Post-op Pain Management: Minimal or no pain anticipated   Induction: Intravenous  PONV Risk Score and Plan: Propofol infusion and TIVA  Airway Management Planned: Natural Airway and Nasal Cannula  Additional Equipment:   Intra-op Plan:   Post-operative Plan:   Informed Consent: I  have reviewed the patients History and Physical, chart, labs and discussed the procedure including the risks, benefits and alternatives for the proposed anesthesia with the patient or authorized representative who has indicated his/her understanding and acceptance.     Dental Advisory Given  Plan Discussed with: Anesthesiologist, CRNA and Surgeon  Anesthesia Plan Comments: (Patient consented for risks of anesthesia including but not limited to:  - adverse reactions to medications - risk of airway placement if required - damage to eyes, teeth, lips or other oral mucosa - nerve damage due to positioning  - sore throat or hoarseness - Damage to heart, brain, nerves, lungs, other parts of body or loss of life  Patient voiced understanding.)       Anesthesia Quick Evaluation

## 2022-01-25 NOTE — Interval H&P Note (Signed)
History and Physical Interval Note:  01/25/2022 9:15 AM  Carmen [name]  has presented today for surgery, with the diagnosis of Colon Cancer Screening.  The various methods of treatment have been discussed with the patient and family. After consideration of risks, benefits and other options for treatment, the patient has consented to  Procedure(s): COLONOSCOPY WITH PROPOFOL (N/A) as a surgical intervention.  The patient's history has been reviewed, patient examined, no change in status, stable for surgery.  I have reviewed the patient's chart and labs.  Questions were answered to the patient's satisfaction.     Lesly Rubenstein  Ok to proceed with colonoscopy

## 2022-01-25 NOTE — Op Note (Signed)
[facility] Gastroenterology Patient Name: Carmen [name] Procedure Date: 01/25/2022 9:03 AM MRN: [id number] Account #: [id number] Date of Birth: 05/29/1950 Admit Type: Outpatient Age: 71 Room: [facility] ENDO ROOM 1 Gender: Female Note Status: Finalized Instrument Name: Jasper Riling [id number] Procedure:             Colonoscopy Indications:           Screening for colorectal malignant neoplasm Providers:             Andrey Farmer MD, MD Medicines:             Monitored Anesthesia Care Complications:         No immediate complications. Estimated blood loss:                         Minimal. Procedure:             Pre-Anesthesia Assessment:                        - Prior to the procedure, a History and Physical was                         performed, and patient medications and allergies were                         reviewed. The patient is competent. The risks and                         benefits of the procedure and the sedation options and                         risks were discussed with the patient. All questions                         were answered and informed consent was obtained.                         Patient identification and proposed procedure were                         verified by the physician, the nurse, the                         anesthesiologist, the anesthetist and the technician                         in the endoscopy suite. Mental Status Examination:                         alert and oriented. Airway Examination: normal                         oropharyngeal airway and neck mobility. Respiratory                         Examination: clear to auscultation. CV Examination:                         normal. Prophylactic Antibiotics: The patient does not  require prophylactic antibiotics. Prior                         Anticoagulants: The patient has taken no previous                         anticoagulant or antiplatelet agents. ASA  Grade                         Assessment: II - A patient with mild systemic disease.                         After reviewing the risks and benefits, the patient                         was deemed in satisfactory condition to undergo the                         procedure. The anesthesia plan was to use monitored                         anesthesia care (MAC). Immediately prior to                         administration of medications, the patient was                         re-assessed for adequacy to receive sedatives. The                         heart rate, respiratory rate, oxygen saturations,                         blood pressure, adequacy of pulmonary ventilation, and                         response to care were monitored throughout the                         procedure. The physical status of the patient was                         re-assessed after the procedure.                        After obtaining informed consent, the colonoscope was                         passed under direct vision. Throughout the procedure,                         the patient's blood pressure, pulse, and oxygen                         saturations were monitored continuously. The                         Colonoscope was introduced through the anus and  advanced to the the ileocecal valve. The colonoscopy                         was somewhat difficult due to inadequate bowel prep.                         The patient tolerated the procedure well. The quality                         of the bowel preparation was poor. Findings:      The perianal and digital rectal examinations were normal.      A 1 mm polyp was found in the cecum. The polyp was sessile. The polyp       was removed with a jumbo cold forceps. Resection and retrieval were       complete. Estimated blood loss was minimal.      The exam was otherwise without abnormality on direct and retroflexion       views. Impression:             - Preparation of the colon was poor.                        - One 1 mm polyp in the cecum, removed with a jumbo                         cold forceps. Resected and retrieved.                        - The examination was otherwise normal on direct and                         retroflexion views. Recommendation:        - Discharge patient to home.                        - Resume previous diet.                        - Continue present medications.                        - Await pathology results.                        - Repeat colonoscopy in 6 months because the bowel                         preparation was suboptimal.                        - Return to referring physician as previously                         scheduled. Procedure Code(s):     --- Professional ---                        [phone], Colonoscopy, flexible; with biopsy, single or  multiple Diagnosis Code(s):     --- Professional ---                        Z12.11, Encounter for screening for malignant neoplasm                         of colon                        K63.5, Polyp of colon CPT copyright 2019 [employer]. All rights reserved. The codes documented in this report are preliminary and upon coder review may  be revised to meet current compliance requirements. Andrey Farmer MD, MD 01/25/2022 9:35:25 AM Number of Addenda: 0 Note Initiated On: 01/25/2022 9:03 AM Scope Withdrawal Time: 0 hours 2 minutes 39 seconds  Total Procedure Duration: 0 hours 8 minutes 0 seconds  Estimated Blood Loss:  Estimated blood loss was minimal.      [facility]

## 2022-01-25 NOTE — H&P (Signed)
Outpatient short stay form Pre-procedure 01/25/2022  Lesly Rubenstein, MD  Primary Physician: Idelle Crouch, MD  Reason for visit:  Screening  History of present illness:    71 y/o lady with history of hypertension and anxiety here for screening colonoscopy. Last colonoscopy was about 10 years ago and normal. No blood thinners. No significant abdominal surgeries. No family history of GI malignancies.    Current Facility-Administered Medications:    0.9 %  sodium chloride infusion, , Intravenous, Continuous, Quenesha Douglass, Hilton Cork, MD, Last Rate: 20 mL/hr at 01/25/22 0853, New Bag at 01/25/22 0853  Medications Prior to Admission  Medication Sig Dispense Refill Last Dose   clonazePAM (KLONOPIN) 0.5 MG tablet Take 0.5 mg by mouth at bedtime. TAKES 1 OR 2   Past Week   KLOR-CON M10 10 MEQ tablet Take 20 mEq by mouth 3 (three) times daily.   Past Week   losartan-hydrochlorothiazide (HYZAAR) 100-12.5 MG tablet Take 1 tablet by mouth daily.    01/25/2022   metoprolol succinate (TOPROL-XL) 50 MG 24 hr tablet Take 75 mg by mouth daily. Take with or immediately following a meal.   01/25/2022   sertraline (ZOLOFT) 100 MG tablet Take 100 mg by mouth 2 (two) times daily.   Past Week   venlafaxine XR (EFFEXOR-XR) 75 MG 24 hr capsule Take 75 mg by mouth daily with breakfast.   Past Week   verapamil (CALAN-SR) 240 MG CR tablet Take 240 mg by mouth daily.  3 01/25/2022   OLANZapine (ZYPREXA) 10 MG tablet Take 10 mg by mouth at bedtime.      traZODone (DESYREL) 50 MG tablet Take 100 mg by mouth at bedtime.      vitamin B-12 (CYANOCOBALAMIN) 100 MCG tablet Take 100 mcg by mouth daily.        Allergies  Allergen Reactions   Prednisone Other (See Comments)    Severe depression     Past Medical History:  Diagnosis Date   Anxiety    Arthritis    back   Depression    Hypertension    controlled on meds   Wears partial dentures    upper    Review of systems:  Otherwise negative.     Physical Exam  Gen: Alert, oriented. Appears stated age.  HEENT: PERRLA. Lungs: No respiratory distress CV: RRR Abd: soft, benign, no masses Ext: No edema    Planned procedures: Proceed with colonoscopy. The patient understands the nature of the planned procedure, indications, risks, alternatives and potential complications including but not limited to bleeding, infection, perforation, damage to internal organs and possible oversedation/side effects from anesthesia. The patient agrees and gives consent to proceed.  Please refer to procedure notes for findings, recommendations and patient disposition/instructions.     Lesly Rubenstein, MD [facility] Gastroenterology

## 2022-01-25 NOTE — Anesthesia Postprocedure Evaluation (Signed)
Anesthesia Post Note  Patient: Carmen [name]  Procedure(s) Performed: COLONOSCOPY WITH PROPOFOL  Patient location during evaluation: Endoscopy Anesthesia Type: General Level of consciousness: awake and alert Pain management: pain level controlled Vital Signs Assessment: post-procedure vital signs reviewed and stable Respiratory status: spontaneous breathing, nonlabored ventilation, respiratory function stable and patient connected to nasal cannula oxygen Cardiovascular status: blood pressure returned to baseline and stable Postop Assessment: no apparent nausea or vomiting Anesthetic complications: no   No notable events documented.   Last Vitals:  Vitals:   01/25/22 0836 01/25/22 0932  BP: 124/61   Pulse: 65 (!) 55  Resp: 16   Temp: (!) 36.2 C   SpO2: 97%     Last Pain:  Vitals:   01/25/22 0932  TempSrc:   PainSc: 0-No pain                 Ilene Qua

## 2022-01-25 NOTE — Transfer of Care (Signed)
Immediate Anesthesia Transfer of Care Note  Patient: Carmen [name]  Procedure(s) Performed: COLONOSCOPY WITH PROPOFOL  Patient Location: PACU  Anesthesia Type:General  Level of Consciousness: sedated  Airway & Oxygen Therapy: Patient Spontanous Breathing  Post-op Assessment: Report given to RN and Post -op Vital signs reviewed and stable  Post vital signs: Reviewed and stable  Last Vitals:  Vitals Value Taken Time  BP 119/58 01/25/22 0933  Temp    Pulse 56 01/25/22 0933  Resp 11 01/25/22 0933  SpO2 98 % 01/25/22 0933  Vitals shown include unvalidated device data.  Last Pain:  Vitals:   01/25/22 0932  TempSrc:   PainSc: 0-No pain         Complications: No notable events documented.

## 2022-01-26 LAB — SURGICAL PATHOLOGY

## 2022-01-27 ENCOUNTER — Encounter: Payer: Self-pay | Admitting: Gastroenterology

## 2022-03-24 DIAGNOSIS — R7303 Prediabetes: Secondary | ICD-10-CM | POA: Insufficient documentation

## 2022-06-21 ENCOUNTER — Other Ambulatory Visit: Payer: Self-pay | Admitting: Pulmonary Disease

## 2022-06-21 DIAGNOSIS — R0602 Shortness of breath: Secondary | ICD-10-CM

## 2022-06-21 DIAGNOSIS — R062 Wheezing: Secondary | ICD-10-CM

## 2022-06-24 ENCOUNTER — Ambulatory Visit: Payer: Medicare Other | Attending: Pulmonary Disease

## 2022-06-24 DIAGNOSIS — R0602 Shortness of breath: Secondary | ICD-10-CM | POA: Diagnosis not present

## 2022-06-24 DIAGNOSIS — R062 Wheezing: Secondary | ICD-10-CM | POA: Insufficient documentation

## 2022-06-24 DIAGNOSIS — Z87891 Personal history of nicotine dependence: Secondary | ICD-10-CM | POA: Insufficient documentation

## 2022-06-24 LAB — PULMONARY FUNCTION TEST ARMC ONLY
FEF 25-75 Post: 1.67 L/sec
FEF 25-75 Pre: 1.49 L/sec
FEF2575-%Change-Post: 12 %
FEF2575-%Pred-Post: 101 %
FEF2575-%Pred-Pre: 90 %
FEV1-%Change-Post: 4 %
FEV1-%Pred-Post: 74 %
FEV1-%Pred-Pre: 71 %
FEV1-Post: 1.48 L
FEV1-Pre: 1.42 L
FEV1FVC-%Change-Post: -5 %
FEV1FVC-%Pred-Pre: 108 %
FEV6-%Change-Post: 10 %
FEV6-%Pred-Post: 75 %
FEV6-%Pred-Pre: 68 %
FEV6-Post: 1.91 L
FEV6-Pre: 1.73 L
FEV6FVC-%Pred-Post: 105 %
FEV6FVC-%Pred-Pre: 105 %
FVC-%Change-Post: 10 %
FVC-%Pred-Post: 72 %
FVC-%Pred-Pre: 65 %
FVC-Post: 1.91 L
FVC-Pre: 1.73 L
Post FEV1/FVC ratio: 78 %
Post FEV6/FVC ratio: 100 %
Pre FEV1/FVC ratio: 82 %
Pre FEV6/FVC Ratio: 100 %

## 2022-06-24 MED ORDER — METHACHOLINE 16 MG/ML NEB SOLN
3.0000 mL | Freq: Once | RESPIRATORY_TRACT | Status: AC
  Filled 2022-06-24: qty 3

## 2022-06-24 MED ORDER — METHACHOLINE 0 MG/ML NEB SOLN
3.0000 mL | Freq: Once | RESPIRATORY_TRACT | Status: AC
  Administered 2022-06-24: 3 mL via RESPIRATORY_TRACT
  Filled 2022-06-24: qty 3

## 2022-06-24 MED ORDER — METHACHOLINE 0.0625 MG/ML NEB SOLN
3.0000 mL | Freq: Once | RESPIRATORY_TRACT | Status: AC
  Administered 2022-06-24: 0.1875 mg via RESPIRATORY_TRACT
  Filled 2022-06-24: qty 3

## 2022-06-24 MED ORDER — METHACHOLINE 0.25 MG/ML NEB SOLN
3.0000 mL | Freq: Once | RESPIRATORY_TRACT | Status: AC
  Administered 2022-06-24: 0.75 mg via RESPIRATORY_TRACT
  Filled 2022-06-24: qty 3

## 2022-06-24 MED ORDER — METHACHOLINE 4 MG/ML NEB SOLN
3.0000 mL | Freq: Once | RESPIRATORY_TRACT | Status: AC
  Filled 2022-06-24: qty 3

## 2022-06-24 MED ORDER — ALBUTEROL SULFATE (2.5 MG/3ML) 0.083% IN NEBU
2.5000 mg | INHALATION_SOLUTION | Freq: Once | RESPIRATORY_TRACT | Status: AC
  Administered 2022-06-24: 2.5 mg via RESPIRATORY_TRACT
  Filled 2022-06-24: qty 3

## 2022-06-24 MED ORDER — METHACHOLINE 1 MG/ML NEB SOLN
3.0000 mL | Freq: Once | RESPIRATORY_TRACT | Status: AC
  Administered 2022-06-24: 3 mg via RESPIRATORY_TRACT
  Filled 2022-06-24: qty 3

## 2022-07-26 ENCOUNTER — Encounter: Payer: Self-pay | Admitting: *Deleted

## 2022-08-01 ENCOUNTER — Encounter: Payer: Self-pay | Admitting: *Deleted

## 2022-08-02 ENCOUNTER — Ambulatory Visit: Admission: RE | Admit: 2022-08-02 | Payer: Medicare Other | Source: Home / Self Care

## 2022-08-02 ENCOUNTER — Encounter: Admission: RE | Payer: Self-pay | Source: Home / Self Care

## 2022-08-02 SURGERY — COLONOSCOPY WITH PROPOFOL
Anesthesia: General

## 2022-08-19 ENCOUNTER — Other Ambulatory Visit: Payer: Self-pay | Admitting: Internal Medicine

## 2022-08-19 DIAGNOSIS — Z1231 Encounter for screening mammogram for malignant neoplasm of breast: Secondary | ICD-10-CM

## 2022-08-22 ENCOUNTER — Encounter (HOSPITAL_COMMUNITY): Payer: Self-pay

## 2022-10-11 ENCOUNTER — Ambulatory Visit
Admission: RE | Admit: 2022-10-11 | Discharge: 2022-10-11 | Disposition: A | Discharge status: Home / Self Care | Payer: Medicare Other | Source: Ambulatory Visit | Attending: Internal Medicine | Admitting: Internal Medicine

## 2022-10-11 DIAGNOSIS — Z1231 Encounter for screening mammogram for malignant neoplasm of breast: Secondary | ICD-10-CM | POA: Diagnosis present

## 2022-10-25 ENCOUNTER — Encounter: Admission: RE | Payer: Self-pay | Source: Home / Self Care

## 2022-10-25 ENCOUNTER — Ambulatory Visit: Admission: RE | Admit: 2022-10-25 | Payer: Medicare Other | Source: Home / Self Care

## 2022-10-25 SURGERY — COLONOSCOPY WITH PROPOFOL
Anesthesia: General

## 2022-12-21 DIAGNOSIS — N1831 Chronic kidney disease, stage 3a: Secondary | ICD-10-CM | POA: Insufficient documentation

## 2022-12-21 DIAGNOSIS — J4520 Mild intermittent asthma, uncomplicated: Secondary | ICD-10-CM | POA: Insufficient documentation

## 2023-02-23 ENCOUNTER — Other Ambulatory Visit: Payer: Self-pay

## 2023-02-23 ENCOUNTER — Emergency Department
Admission: EM | Admit: 2023-02-23 | Discharge: 2023-02-23 | Disposition: A | Discharge status: Home / Self Care | Payer: Medicare Other | Attending: Emergency Medicine | Admitting: Emergency Medicine

## 2023-02-23 DIAGNOSIS — E860 Dehydration: Secondary | ICD-10-CM | POA: Insufficient documentation

## 2023-02-23 DIAGNOSIS — R197 Diarrhea, unspecified: Secondary | ICD-10-CM | POA: Diagnosis present

## 2023-02-23 DIAGNOSIS — R531 Weakness: Secondary | ICD-10-CM | POA: Insufficient documentation

## 2023-02-23 DIAGNOSIS — A09 Infectious gastroenteritis and colitis, unspecified: Secondary | ICD-10-CM

## 2023-02-23 LAB — URINALYSIS, ROUTINE W REFLEX MICROSCOPIC
Bilirubin Urine: NEGATIVE
Glucose, UA: NEGATIVE mg/dL
Hgb urine dipstick: NEGATIVE
Ketones, ur: NEGATIVE mg/dL
Nitrite: NEGATIVE
Protein, ur: NEGATIVE mg/dL
Specific Gravity, Urine: 1.003 — ABNORMAL LOW (ref 1.005–1.030)
pH: 5 (ref 5.0–8.0)

## 2023-02-23 LAB — CBC
HCT: 37.4 % (ref 36.0–46.0)
Hemoglobin: 12.5 g/dL (ref 12.0–15.0)
MCH: 31.1 pg (ref 26.0–34.0)
MCHC: 33.4 g/dL (ref 30.0–36.0)
MCV: 93 fL (ref 80.0–100.0)
Platelets: 224 10*3/uL (ref 150–400)
RBC: 4.02 MIL/uL (ref 3.87–5.11)
RDW: 12.9 % (ref 11.5–15.5)
WBC: 8.8 10*3/uL (ref 4.0–10.5)
nRBC: 0 % (ref 0.0–0.2)

## 2023-02-23 LAB — COMPREHENSIVE METABOLIC PANEL
ALT: 12 U/L (ref 0–44)
AST: 19 U/L (ref 15–41)
Albumin: 4.2 g/dL (ref 3.5–5.0)
Alkaline Phosphatase: 63 U/L (ref 38–126)
Anion gap: 9 (ref 5–15)
BUN: 11 mg/dL (ref 8–23)
CO2: 28 mmol/L (ref 22–32)
Calcium: 9.4 mg/dL (ref 8.9–10.3)
Chloride: 94 mmol/L — ABNORMAL LOW (ref 98–111)
Creatinine, Ser: 1.19 mg/dL — ABNORMAL HIGH (ref 0.44–1.00)
GFR, Estimated: 48 mL/min — ABNORMAL LOW (ref >60)
Glucose, Bld: 108 mg/dL — ABNORMAL HIGH (ref 70–99)
Potassium: 4.2 mmol/L (ref 3.5–5.1)
Sodium: 131 mmol/L — ABNORMAL LOW (ref 135–145)
Total Bilirubin: 0.7 mg/dL (ref <1.2)
Total Protein: 7.8 g/dL (ref 6.5–8.1)

## 2023-02-23 LAB — LIPASE, BLOOD: Lipase: 30 U/L (ref 11–51)

## 2023-02-23 MED ORDER — SODIUM CHLORIDE 0.9 % IV BOLUS
500.0000 mL | Freq: Once | INTRAVENOUS | Status: AC
  Administered 2023-02-23: 500 mL via INTRAVENOUS

## 2023-02-23 NOTE — ED Provider Notes (Signed)
[facility] Provider Note    Event Date/Time   First MD Initiated Contact with Patient 02/23/23 1511     (approximate)   History   Diarrhea and c,diff   HPI  Carmen [name] is a 72 y.o. female presents to the emergency department for generalized weakness and diarrhea.  Patient states that she has been having intermittent episodes of diarrhea over the past couple of weeks.  States that she initially tested positive for C. difficile but was not having symptoms at that time.  She was evaluated by her primary care physician.  States that she was prescribed oral vancomycin but was initially confused and thought she was not supposed to take this medication.  Endorses copious diarrhea that is watery over the past 3 to 4 days.  Called and followed up with her primary care physician who told her to take the p.o. vancomycin.  States that she started the p.o. vancomycin approximately 3 days ago and has been having ongoing diarrhea.  Yesterday had 2 falls secondary to feeling weak.  States that she has not been falling today.  Denies nausea or vomiting.  Denies dysuria.     Physical Exam   Triage Vital Signs: ED Triage Vitals [02/23/23 1227]  Encounter Vitals Group     BP 110/62     Systolic BP Percentile      Diastolic BP Percentile      Pulse Rate 76     Resp 16     Temp 98.4 F (36.9 C)     Temp Source Oral     SpO2 95 %     Weight 151 lb (68.5 kg)     Height 5\' 2"  (1.575 m)     Head Circumference      Peak Flow      Pain Score 0     Pain Loc      Pain Education      Exclude from Growth Chart     Most recent vital signs: Vitals:   02/23/23 1700 02/23/23 1711  BP: (!) 146/70 120/68  Pulse: 84 96  Resp: 18 (!) 25  Temp:    SpO2: 97% 95%    Physical Exam Constitutional:      Appearance: She is well-developed.  HENT:     Head: Atraumatic.  Eyes:     Conjunctiva/sclera: Conjunctivae normal.  Cardiovascular:     Rate and Rhythm: Regular  rhythm.  Pulmonary:     Effort: No respiratory distress.  Abdominal:     General: There is no distension.     Tenderness: There is no abdominal tenderness.  Musculoskeletal:        General: Normal range of motion.     Cervical back: Normal range of motion.  Skin:    General: Skin is warm.     Capillary Refill: Capillary refill takes less than 2 seconds.  Neurological:     Mental Status: She is alert. Mental status is at baseline.     GCS: GCS eye subscore is 4. GCS verbal subscore is 5. GCS motor subscore is 6.     Cranial Nerves: Cranial nerves 2-12 are intact.     Sensory: Sensation is intact.     Motor: Motor function is intact.     Gait: Gait is intact.     IMPRESSION / MDM / ASSESSMENT AND PLAN / ED COURSE  I reviewed the triage vital signs and the nursing notes.  Differential diagnosis including diarrhea, C.  difficile, electrolyte abnormality, dehydration  On chart review patient was just recently restarted on oral vancomycin given concern for C. difficile.  Denies any bloody diarrhea.    No tachycardic or bradycardic dysrhythmias while on cardiac telemetry.    LABS (all labs ordered are listed, but only abnormal results are displayed) Labs interpreted as -    Labs Reviewed  COMPREHENSIVE METABOLIC PANEL - Abnormal; Notable for the following components:      Result Value   Sodium 131 (*)    Chloride 94 (*)    Glucose, Bld 108 (*)    Creatinine, Ser 1.19 (*)    GFR, Estimated 48 (*)    All other components within normal limits  URINALYSIS, ROUTINE W REFLEX MICROSCOPIC - Abnormal; Notable for the following components:   Color, Urine STRAW (*)    APPearance HAZY (*)    Specific Gravity, Urine 1.003 (*)    Leukocytes,Ua TRACE (*)    Bacteria, UA RARE (*)    All other components within normal limits  LIPASE, BLOOD  CBC     MDM    Patient was given a 500 bolus.  No significant electrolyte abnormality.  No signs of urinary tract infection.  Mild  hyponatremia of 131 that is likely in the setting of dehydration.  Initially patient had positive orthostatic hypotension.  After a 500 bolus and p.o. fluids patient had improvement of her blood pressure with standing.  Able to ambulate in the emergency department without any episodes of lightheadedness and not feeling dizzy.  Blood pressure was 120 while standing.  Given that the patient has only been on p.o. vancomycin for the past couple of days discussed continuing on her oral antibiotics, oral hydration and close follow-up with her primary care physician.  Given return precautions if she had any worsening dizziness or feeling lightheaded to come back to the emergency department for reevaluation and to recheck her electrolytes.  Patient expressed understanding.  Will discharge home with her husband.   PROCEDURES:  Critical Care performed: No  Procedures  Patient's presentation is most consistent with acute presentation with potential threat to life or bodily function.   MEDICATIONS ORDERED IN ED: Medications  sodium chloride 0.9 % bolus 500 mL (0 mLs Intravenous Stopped 02/23/23 1703)    FINAL CLINICAL IMPRESSION(S) / ED DIAGNOSES   Final diagnoses:  Diarrhea of infectious origin  Dehydration     Rx / DC Orders   ED Discharge Orders     None        Note:  This document was prepared using Dragon voice recognition software and may include unintentional dictation errors.   Corena Herter, MD 02/24/23 [phone]

## 2023-02-23 NOTE — ED Triage Notes (Signed)
Pt was diagnosed with c.diff and has been taking the antibiotics for the treatment, states the last time she was checked it was negative, reports that she has had diarrhea for the past 3 weeks

## 2023-02-23 NOTE — Discharge Instructions (Signed)
You were seen in the emergency department for diarrhea and feeling weak.  You are given IV fluids in the emergency department.  Your electrolytes showed mildly low sodium.  Make sure that you stay hydrated and drink plenty of fluids.  Drinking Gatorade is a great way to hydrate.  Call and follow-up closely with your primary care provider.  Return to the emergency department if you have any worsening symptoms.  Thank you for choosing [place] for your health care, it was my pleasure to care for you today!  Corena Herter, MD

## 2023-03-12 ENCOUNTER — Inpatient Hospital Stay
Admission: EM | Admit: 2023-03-12 | Discharge: 2023-03-15 | DRG: 163 | Disposition: A | Discharge status: Home / Self Care | Payer: Medicare Other | Attending: Internal Medicine | Admitting: Internal Medicine

## 2023-03-12 ENCOUNTER — Emergency Department: Payer: Medicare Other

## 2023-03-12 ENCOUNTER — Encounter: Payer: Self-pay | Admitting: Internal Medicine

## 2023-03-12 ENCOUNTER — Other Ambulatory Visit: Payer: Self-pay

## 2023-03-12 DIAGNOSIS — Z7901 Long term (current) use of anticoagulants: Secondary | ICD-10-CM

## 2023-03-12 DIAGNOSIS — I82220 Acute embolism and thrombosis of inferior vena cava: Secondary | ICD-10-CM | POA: Diagnosis present

## 2023-03-12 DIAGNOSIS — R579 Shock, unspecified: Secondary | ICD-10-CM | POA: Diagnosis present

## 2023-03-12 DIAGNOSIS — E872 Acidosis, unspecified: Secondary | ICD-10-CM | POA: Diagnosis present

## 2023-03-12 DIAGNOSIS — R6510 Systemic inflammatory response syndrome (SIRS) of non-infectious origin without acute organ dysfunction: Secondary | ICD-10-CM | POA: Diagnosis not present

## 2023-03-12 DIAGNOSIS — E876 Hypokalemia: Secondary | ICD-10-CM | POA: Diagnosis present

## 2023-03-12 DIAGNOSIS — Z87891 Personal history of nicotine dependence: Secondary | ICD-10-CM

## 2023-03-12 DIAGNOSIS — I129 Hypertensive chronic kidney disease with stage 1 through stage 4 chronic kidney disease, or unspecified chronic kidney disease: Secondary | ICD-10-CM | POA: Diagnosis present

## 2023-03-12 DIAGNOSIS — K219 Gastro-esophageal reflux disease without esophagitis: Secondary | ICD-10-CM | POA: Diagnosis present

## 2023-03-12 DIAGNOSIS — F32A Depression, unspecified: Secondary | ICD-10-CM | POA: Diagnosis present

## 2023-03-12 DIAGNOSIS — D62 Acute posthemorrhagic anemia: Secondary | ICD-10-CM | POA: Diagnosis not present

## 2023-03-12 DIAGNOSIS — Z9842 Cataract extraction status, left eye: Secondary | ICD-10-CM

## 2023-03-12 DIAGNOSIS — Z9841 Cataract extraction status, right eye: Secondary | ICD-10-CM

## 2023-03-12 DIAGNOSIS — N184 Chronic kidney disease, stage 4 (severe): Secondary | ICD-10-CM | POA: Diagnosis present

## 2023-03-12 DIAGNOSIS — R000 Tachycardia, unspecified: Secondary | ICD-10-CM | POA: Diagnosis present

## 2023-03-12 DIAGNOSIS — N179 Acute kidney failure, unspecified: Secondary | ICD-10-CM | POA: Diagnosis present

## 2023-03-12 DIAGNOSIS — E663 Overweight: Secondary | ICD-10-CM | POA: Diagnosis present

## 2023-03-12 DIAGNOSIS — N1831 Chronic kidney disease, stage 3a: Secondary | ICD-10-CM | POA: Diagnosis present

## 2023-03-12 DIAGNOSIS — D72829 Elevated white blood cell count, unspecified: Secondary | ICD-10-CM | POA: Diagnosis present

## 2023-03-12 DIAGNOSIS — M79605 Pain in left leg: Secondary | ICD-10-CM | POA: Diagnosis present

## 2023-03-12 DIAGNOSIS — I82409 Acute embolism and thrombosis of unspecified deep veins of unspecified lower extremity: Secondary | ICD-10-CM | POA: Diagnosis present

## 2023-03-12 DIAGNOSIS — Z6828 Body mass index (BMI) 28.0-28.9, adult: Secondary | ICD-10-CM | POA: Diagnosis not present

## 2023-03-12 DIAGNOSIS — F411 Generalized anxiety disorder: Secondary | ICD-10-CM | POA: Diagnosis present

## 2023-03-12 DIAGNOSIS — I82442 Acute embolism and thrombosis of left tibial vein: Secondary | ICD-10-CM | POA: Diagnosis present

## 2023-03-12 DIAGNOSIS — J45909 Unspecified asthma, uncomplicated: Secondary | ICD-10-CM | POA: Diagnosis present

## 2023-03-12 DIAGNOSIS — I2699 Other pulmonary embolism without acute cor pulmonale: Secondary | ICD-10-CM | POA: Diagnosis not present

## 2023-03-12 DIAGNOSIS — I959 Hypotension, unspecified: Secondary | ICD-10-CM | POA: Diagnosis present

## 2023-03-12 DIAGNOSIS — Z9889 Other specified postprocedural states: Secondary | ICD-10-CM | POA: Diagnosis not present

## 2023-03-12 DIAGNOSIS — E785 Hyperlipidemia, unspecified: Secondary | ICD-10-CM | POA: Diagnosis present

## 2023-03-12 DIAGNOSIS — I82412 Acute embolism and thrombosis of left femoral vein: Secondary | ICD-10-CM | POA: Diagnosis present

## 2023-03-12 DIAGNOSIS — Z803 Family history of malignant neoplasm of breast: Secondary | ICD-10-CM

## 2023-03-12 DIAGNOSIS — R0989 Other specified symptoms and signs involving the circulatory and respiratory systems: Secondary | ICD-10-CM | POA: Diagnosis present

## 2023-03-12 DIAGNOSIS — I871 Compression of vein: Secondary | ICD-10-CM | POA: Diagnosis present

## 2023-03-12 DIAGNOSIS — R600 Localized edema: Secondary | ICD-10-CM | POA: Diagnosis present

## 2023-03-12 DIAGNOSIS — I82422 Acute embolism and thrombosis of left iliac vein: Secondary | ICD-10-CM | POA: Diagnosis present

## 2023-03-12 DIAGNOSIS — Z79899 Other long term (current) drug therapy: Secondary | ICD-10-CM | POA: Diagnosis not present

## 2023-03-12 DIAGNOSIS — R0902 Hypoxemia: Secondary | ICD-10-CM | POA: Diagnosis present

## 2023-03-12 DIAGNOSIS — I2609 Other pulmonary embolism with acute cor pulmonale: Secondary | ICD-10-CM | POA: Diagnosis not present

## 2023-03-12 DIAGNOSIS — Z90710 Acquired absence of both cervix and uterus: Secondary | ICD-10-CM

## 2023-03-12 DIAGNOSIS — Z961 Presence of intraocular lens: Secondary | ICD-10-CM | POA: Diagnosis present

## 2023-03-12 DIAGNOSIS — Z95828 Presence of other vascular implants and grafts: Secondary | ICD-10-CM | POA: Diagnosis not present

## 2023-03-12 DIAGNOSIS — R7303 Prediabetes: Secondary | ICD-10-CM | POA: Diagnosis present

## 2023-03-12 HISTORY — DX: Systemic inflammatory response syndrome (sirs) of non-infectious origin without acute organ dysfunction: R65.10

## 2023-03-12 HISTORY — DX: Gastro-esophageal reflux disease without esophagitis: K21.9

## 2023-03-12 HISTORY — DX: Hypotension, unspecified: I95.9

## 2023-03-12 HISTORY — DX: Dehydration: E86.0

## 2023-03-12 HISTORY — DX: Prediabetes: R73.03

## 2023-03-12 LAB — COMPREHENSIVE METABOLIC PANEL
ALT: 13 U/L (ref 0–44)
AST: 20 U/L (ref 15–41)
Albumin: 4.3 g/dL (ref 3.5–5.0)
Alkaline Phosphatase: 71 U/L (ref 38–126)
Anion gap: 15 (ref 5–15)
BUN: 47 mg/dL — ABNORMAL HIGH (ref 8–23)
CO2: 17 mmol/L — ABNORMAL LOW (ref 22–32)
Calcium: 9.3 mg/dL (ref 8.9–10.3)
Chloride: 100 mmol/L (ref 98–111)
Creatinine, Ser: 2.77 mg/dL — ABNORMAL HIGH (ref 0.44–1.00)
GFR, Estimated: 18 mL/min — ABNORMAL LOW (ref >60)
Glucose, Bld: 138 mg/dL — ABNORMAL HIGH (ref 70–99)
Potassium: 4.7 mmol/L (ref 3.5–5.1)
Sodium: 132 mmol/L — ABNORMAL LOW (ref 135–145)
Total Bilirubin: 0.9 mg/dL (ref <1.2)
Total Protein: 8.1 g/dL (ref 6.5–8.1)

## 2023-03-12 LAB — CBC WITH DIFFERENTIAL/PLATELET
Abs Immature Granulocytes: 0.1 10*3/uL — ABNORMAL HIGH (ref 0.00–0.07)
Basophils Absolute: 0.1 10*3/uL (ref 0.0–0.1)
Basophils Relative: 0 %
Eosinophils Absolute: 0 10*3/uL (ref 0.0–0.5)
Eosinophils Relative: 0 %
HCT: 39.5 % (ref 36.0–46.0)
Hemoglobin: 12.6 g/dL (ref 12.0–15.0)
Immature Granulocytes: 1 %
Lymphocytes Relative: 6 %
Lymphs Abs: 0.9 10*3/uL (ref 0.7–4.0)
MCH: 31.2 pg (ref 26.0–34.0)
MCHC: 31.9 g/dL (ref 30.0–36.0)
MCV: 97.8 fL (ref 80.0–100.0)
Monocytes Absolute: 0.8 10*3/uL (ref 0.1–1.0)
Monocytes Relative: 5 %
Neutro Abs: 13.2 10*3/uL — ABNORMAL HIGH (ref 1.7–7.7)
Neutrophils Relative %: 88 %
Platelets: 205 10*3/uL (ref 150–400)
RBC: 4.04 MIL/uL (ref 3.87–5.11)
RDW: 13.1 % (ref 11.5–15.5)
WBC: 15 10*3/uL — ABNORMAL HIGH (ref 4.0–10.5)
nRBC: 0 % (ref 0.0–0.2)

## 2023-03-12 LAB — BRAIN NATRIURETIC PEPTIDE: B Natriuretic Peptide: 14.3 pg/mL (ref 0.0–100.0)

## 2023-03-12 LAB — PROTIME-INR
INR: 1.2 (ref 0.8–1.2)
Prothrombin Time: 15.5 s — ABNORMAL HIGH (ref 11.4–15.2)

## 2023-03-12 LAB — LACTIC ACID, PLASMA
Lactic Acid, Venous: 3.3 mmol/L (ref 0.5–1.9)
Lactic Acid, Venous: 1.2 mmol/L (ref 0.5–1.9)
Lactic Acid, Venous: 2.2 mmol/L (ref 0.5–1.9)

## 2023-03-12 LAB — APTT: aPTT: 23 s — ABNORMAL LOW (ref 24–36)

## 2023-03-12 LAB — TROPONIN I (HIGH SENSITIVITY)
Troponin I (High Sensitivity): 5 ng/L (ref <18)
Troponin I (High Sensitivity): 5 ng/L (ref <18)

## 2023-03-12 LAB — MRSA NEXT GEN BY PCR, NASAL: MRSA by PCR Next Gen: NOT DETECTED

## 2023-03-12 MED ORDER — SODIUM CHLORIDE 0.9 % IV BOLUS
1000.0000 mL | Freq: Once | INTRAVENOUS | Status: AC
  Administered 2023-03-12: 1000 mL via INTRAVENOUS

## 2023-03-12 MED ORDER — CLONAZEPAM 0.5 MG PO TABS
0.5000 mg | ORAL_TABLET | Freq: Every day | ORAL | Status: DC
  Administered 2023-03-12 – 2023-03-14 (×3): 0.5 mg via ORAL
  Filled 2023-03-12 (×3): qty 1

## 2023-03-12 MED ORDER — HYDROCODONE-ACETAMINOPHEN 5-325 MG PO TABS: 1.0000 | ORAL_TABLET | ORAL | Status: DC | PRN

## 2023-03-12 MED ORDER — OLANZAPINE 10 MG PO TABS
10.0000 mg | ORAL_TABLET | Freq: Every day | ORAL | Status: DC
  Administered 2023-03-12 – 2023-03-14 (×3): 10 mg via ORAL
  Filled 2023-03-12 (×5): qty 1

## 2023-03-12 MED ORDER — SODIUM CHLORIDE 0.9 % IV BOLUS
500.0000 mL | Freq: Once | INTRAVENOUS | Status: AC
  Administered 2023-03-12: 500 mL via INTRAVENOUS

## 2023-03-12 MED ORDER — TRAZODONE HCL 50 MG PO TABS
50.0000 mg | ORAL_TABLET | Freq: Every day | ORAL | Status: DC
  Administered 2023-03-12: 50 mg via ORAL
  Filled 2023-03-12: qty 1

## 2023-03-12 MED ORDER — PANTOPRAZOLE SODIUM 40 MG IV SOLR
40.0000 mg | Freq: Two times a day (BID) | INTRAVENOUS | Status: DC
  Administered 2023-03-12 – 2023-03-15 (×6): 40 mg via INTRAVENOUS
  Filled 2023-03-12 (×6): qty 10

## 2023-03-12 MED ORDER — HEPARIN (PORCINE) 25000 UT/250ML-% IV SOLN
950.0000 [IU]/h | INTRAVENOUS | Status: DC
  Administered 2023-03-12: 1000 [IU]/h via INTRAVENOUS
  Administered 2023-03-13 – 2023-03-15 (×3): 950 [IU]/h via INTRAVENOUS
  Filled 2023-03-12 (×3): qty 250

## 2023-03-12 MED ORDER — HEPARIN BOLUS VIA INFUSION
4500.0000 [IU] | Freq: Once | INTRAVENOUS | Status: AC
  Administered 2023-03-12: 4500 [IU] via INTRAVENOUS
  Filled 2023-03-12: qty 4500

## 2023-03-12 MED ORDER — CHLORHEXIDINE GLUCONATE CLOTH 2 % EX PADS
6.0000 | MEDICATED_PAD | Freq: Every day | CUTANEOUS | Status: DC
  Administered 2023-03-12 – 2023-03-14 (×3): 6 via TOPICAL
  Filled 2023-03-12: qty 6

## 2023-03-12 MED ORDER — SODIUM CHLORIDE 0.9 % IV SOLN: Freq: Once | INTRAVENOUS | Status: AC

## 2023-03-12 MED ORDER — IOHEXOL 350 MG/ML SOLN
75.0000 mL | Freq: Once | INTRAVENOUS | Status: AC | PRN
  Administered 2023-03-12: 75 mL via INTRAVENOUS

## 2023-03-12 MED ORDER — LACTATED RINGERS IV SOLN: INTRAVENOUS | Status: AC

## 2023-03-12 MED ORDER — SERTRALINE HCL 50 MG PO TABS
200.0000 mg | ORAL_TABLET | Freq: Every day | ORAL | Status: DC
  Administered 2023-03-13 – 2023-03-15 (×3): 200 mg via ORAL
  Filled 2023-03-12 (×3): qty 4

## 2023-03-12 MED ORDER — ACETAMINOPHEN 325 MG PO TABS: 650.0000 mg | ORAL_TABLET | Freq: Four times a day (QID) | ORAL | Status: DC | PRN

## 2023-03-12 MED ORDER — SODIUM CHLORIDE 0.9% FLUSH
3.0000 mL | Freq: Two times a day (BID) | INTRAVENOUS | Status: DC
  Administered 2023-03-12 – 2023-03-15 (×6): 3 mL via INTRAVENOUS

## 2023-03-12 MED ORDER — ACETAMINOPHEN 650 MG RE SUPP: 650.0000 mg | Freq: Four times a day (QID) | RECTAL | Status: DC | PRN

## 2023-03-12 MED ORDER — ORAL CARE MOUTH RINSE: 15.0000 mL | OROMUCOSAL | Status: DC | PRN

## 2023-03-12 MED ORDER — MORPHINE SULFATE (PF) 2 MG/ML IV SOLN: 2.0000 mg | INTRAVENOUS | Status: DC | PRN

## 2023-03-12 NOTE — Assessment & Plan Note (Signed)
Cont heparin gtt

## 2023-03-12 NOTE — Assessment & Plan Note (Signed)
On initial presentation and ED report patient was said to have no pedal pulses which initially was concerning for limb ischemia however based on patient's history I do not suspect patient has limb ischemia.  The absent pedal pulses are the only dopplerable pulses found on initial presentation was secondary to patient being in shock with initial systolic of 63/38.

## 2023-03-12 NOTE — Assessment & Plan Note (Addendum)
With patient presentation of shock/ dvt and pe suspect pt has right heart strain. We will get echo stat if possible.  Vascular is planning for thrombectomy in am.   Pt's presentation is concerning for right heart involvement. Son and spouse at bedside, discussed with them the presentation of the patient the risk factors of the patient for having a blood clot in her leg as well as her lungs, patient's prognosis is critical.  I am concerned about blood clot involving her heart. Inform family that the plan will be to start patient on blood thinners tonight monitoring the patient getting an ultrasound of the heart getting specialist involved that she may need to have a procedure to have the clot removed depending what vascular recommends.  Discussed with them that most likely etiology for her clot is the COVID infection that she had, but son states that mom and dad also take frequent trips. N.p.o. after midnight, continuous pulse oximetry, continuous card monitoring. Per vascular will request PCCM consult, and admit patient to stepdown unit.

## 2023-03-12 NOTE — Progress Notes (Signed)
Full consult to follow. Patient with initial presentation of symptomatic pulmonary embolism with hypotension and Left lower extremity DVT. Symptoms resolved while in ER. Currently asymptomatic and hemodynamically stable on room air.  Heparin gtt- ensure therapeutic  ECHO Will plan for pulmonary and left lower extremity thromboembolectomy tomorrow.

## 2023-03-12 NOTE — H&P (View-Only) (Signed)
Full consult to follow. Patient with initial presentation of symptomatic pulmonary embolism with hypotension and Left lower extremity DVT. Symptoms resolved while in ER. Currently asymptomatic and hemodynamically stable on room air.  Heparin gtt- ensure therapeutic  ECHO Will plan for pulmonary and left lower extremity thromboembolectomy tomorrow.

## 2023-03-12 NOTE — Consult Note (Signed)
PHARMACY - ANTICOAGULATION CONSULT NOTE  Pharmacy Consult for Heparin  Indication: VTE Treatment   Allergies  Allergen Reactions   Prednisone Other (See Comments)    Severe depression   Patient Measurements: Height: 5\' 2"  (157.5 cm) Weight: 67.1 kg (148 lb) IBW/kg (Calculated) : 50.1 Heparin Dosing Weight: 64 kg   Vital Signs: Temp: 97.6 F (36.4 C) (11/24 1437) BP: 106/56 (11/24 1500) Pulse Rate: 85 (11/24 1500)  Labs: Recent Labs    03/12/23 1444 03/12/23 1455  HGB 12.6  --   HCT 39.5  --   PLT 205  --   LABPROT  --  15.5*  INR  --  1.2  CREATININE 2.77*  --    Estimated Creatinine Clearance: 16.2 mL/min (A) (by C-G formula based on SCr of 2.77 mg/dL (H)).  Medical History: Past Medical History:  Diagnosis Date   Anxiety    Arthritis    back   Depression    Hypertension    controlled on meds   Wears partial dentures    upper   Medications:  No PTA anticoagulation   Assessment: Carmen [name] is a 72 year old female that presented with leg swelling and discoloration. Patient states that she has had some discomfort for about 1 week and noticed it was somewhat discolored today. From chart review, patient is not on any PTA anticoagulation. Imaging to rule out DVT and PE has been ordered. Pharmacy has been consulted for management of a heparin infusion. Baseline labs: Hgb 12.6, PLT 205, INR 1.2, aPTT pending.   Goal of Therapy:  Heparin level 0.3-0.7 units/ml Monitor platelets by anticoagulation protocol: Yes   Plan:  Give 4500 bolus x 1  Start heparin infusion at 1000 units/hr  Check HL 8 hours after initiation of infusion  Monitor CBC daily while on heparin   Littie Deeds, PharmD Pharmacy Resident  03/12/2023 3:19 PM

## 2023-03-12 NOTE — ED Provider Notes (Signed)
[facility] Provider Note    Event Date/Time   First MD Initiated Contact with Patient 03/12/23 1449     (approximate)  History   Chief Complaint: Leg Swelling  HPI  Carmen [name] is a 72 y.o. female with a past medical history of anxiety, depression, hypertension presents to the emergency department for left leg swelling and discoloration.  Patient states for the last 2 to 3 days she has noted some swelling in the left leg with 1+ weeks of discomfort.  Today she noted that the leg appeared somewhat discolored came to the emergency department for evaluation.  Denies any chest pain denies any shortness of breath.  Patient found to be hypotensive 63/38 in the emergency department and was brought back emergently to a room for evaluation, recheck prior to intervention blood pressure 91/61.  No history of DVT or PE previously.  Physical Exam   Triage Vital Signs: ED Triage Vitals  Encounter Vitals Group     BP 03/12/23 1437 (!) 63/38     Systolic BP Percentile --      Diastolic BP Percentile --      Pulse Rate 03/12/23 1437 100     Resp 03/12/23 1437 18     Temp 03/12/23 1437 97.6 F (36.4 C)     Temp src --      SpO2 03/12/23 1437 95 %     Weight 03/12/23 1439 148 lb (67.1 kg)     Height 03/12/23 1439 5\' 2"  (1.575 m)     Head Circumference --      Peak Flow --      Pain Score 03/12/23 1438 7     Pain Loc --      Pain Education --      Exclude from Growth Chart --     Most recent vital signs: Vitals:   03/12/23 1437 03/12/23 1449  BP: (!) 63/38 91/61  Pulse: 100 (!) 104  Resp: 18 19  Temp: 97.6 F (36.4 C)   SpO2: 95%     General: Awake, no distress.  CV:  Good peripheral perfusion.  Regular rate and rhythm  Resp:  Normal effort.  Equal breath sounds bilaterally.  Abd:  No distention.  Soft, nontender.  No rebound or guarding. Other:  Patient has a swollen left leg with darker discoloration.  Faint palpable pulse but strong Doppler DP  pulse.  Sensation intact.   ED Results / Procedures / Treatments   EKG  EKG viewed and interpreted by myself shows sinus tachycardia 107 bpm the narrow QRS, normal axis, normal intervals, no concerning ST changes.  RADIOLOGY  Ultrasound and CTA pending   MEDICATIONS ORDERED IN ED: Medications  sodium chloride 0.9 % bolus 1,000 mL (1,000 mLs Intravenous New Bag/Given 03/12/23 1452)     IMPRESSION / MDM / ASSESSMENT AND PLAN / ED COURSE  I reviewed the triage vital signs and the nursing notes.  Patient's presentation is most consistent with acute presentation with potential threat to life or bodily function.  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  Good dopplerable pulse.  We will start the patient on a heparin infusion we will obtain an ultrasound of the left lower extremity we will obtain a CTA of the chest continue with IV hydration check labs and continue to closely monitor.  Patient will require admission to the hospital service once her workup  is been completed for likely intervention.  Will likely require vascular consultation once her ultrasound has been completed.  Patient care signed out to oncoming provider.  CRITICAL CARE Performed by: Minna Antis   Total critical care time: 30 minutes  Critical care time was exclusive of separately billable procedures and treating other patients.  Critical care was necessary to treat or prevent imminent or life-threatening deterioration.  Critical care was time spent personally by me on the following activities: development of treatment plan with patient and/or surrogate as well as nursing, discussions with consultants, evaluation of patient's response to treatment, examination of patient, obtaining history from patient or surrogate, ordering and performing treatments and interventions, ordering and  review of laboratory studies, ordering and review of radiographic studies, pulse oximetry and re-evaluation of patient's condition.   FINAL CLINICAL IMPRESSION(S) / ED DIAGNOSES   Left lower extremity swelling   Note:  This document was prepared using Dragon voice recognition software and may include unintentional dictation errors.   Minna Antis, MD 03/12/23 [id number]

## 2023-03-12 NOTE — Assessment & Plan Note (Addendum)
Lab Results  Component Value Date   CREATININE 2.77 (H) 03/12/2023   CREATININE 1.19 (H) 02/23/2023   CREATININE 1.14 (H) 08/18/2021  Patient received CT angio study of the chest which was essential to her care today. Will start patient on normal saline at low rate and follow clear creatinine closely and renally dose additional medications.  Hold diuretic therapy hold losartan.

## 2023-03-12 NOTE — H&P (Signed)
History and Physical    Patient: Carmen [name] GNF:[id number] DOB: Aug 21, 1950 DOA: 03/12/2023 DOS: the patient was seen and examined on 03/12/2023 PCP: Marguarite Arbour, MD  Patient coming from: Home  Chief Complaint:  Chief Complaint  Patient presents with   Leg Swelling   HPI: Carmen [name] is a 72 y.o. female with medical history significant for hypertension, depression, anxiety comes to the emergency room today for left leg swelling.  Per nurses initial triage note patient woke up yesterday and had pain in her left leg and as the day went on she started to have swelling which got progressively worse patient was not able to stand or bear weight and EMS was called and initial vitals of 104/60 heart rate 94 O2 sats of 96% on room air.  Patient's limb noted to be purplish on initial eval has Doppler pulses only.  Chart review shows that patient was seen in the emergency room on November 7 for diarrhea diagnosed with C. difficile and continued on p.o. vancomycin and given IV fluids.  On initial presentation here in the hospital patient was hypotensive in shock with systolic of 63/38 heart rate was 100 respirations of 18 afebrile 97.6 O2 sats of 95%. In emergency room vitals trend shows: Vitals:   03/12/23 1846 03/12/23 2000 03/12/23 2100 03/12/23 2130  BP:  137/62 (!) 144/82 (!) 147/82  Pulse:  84 99 96  Temp: 97.8 F (36.6 C)   98.2 F (36.8 C)  Resp:  20 17 18   Height:    5\' 2"  (1.575 m)  Weight:    70.6 kg  SpO2:      TempSrc: Oral   Oral  BMI (Calculated):    28.46  stat EKG showed sinus tach at 107 narrow QRS normal axis no ST-T wave changes.  BNP of 14.3, troponin of 5 Labs are notable for : Metabolic panel showing mild hyponatremia of 132, non-anion gap metabolic acidosis with bicarb of 17 anion gap of 15, AKI on CKD with a creatinine of 2.77 EGFR of 18 glucose 138, normal LFTs Initial lactic of 3.3. Repeat lactic of 1.2. Leukocytosis of 15 normal hemoglobin  and normal platelet count.   In the ED pt received: Medications  heparin ADULT infusion 100 units/mL (25000 units/238mL) (1,000 Units/hr Intravenous New Bag/Given 03/12/23 1551)  pantoprazole (PROTONIX) injection 40 mg (40 mg Intravenous Given 03/12/23 1943)  OLANZapine (ZYPREXA) tablet 10 mg (10 mg Oral Given 03/12/23 2228)  sertraline (ZOLOFT) tablet 200 mg (has no administration in time range)  sodium chloride flush (NS) 0.9 % injection 3 mL (3 mLs Intravenous Given 03/12/23 2134)  acetaminophen (TYLENOL) tablet 650 mg (has no administration in time range)    Or  acetaminophen (TYLENOL) suppository 650 mg (has no administration in time range)  HYDROcodone-acetaminophen (NORCO/VICODIN) 5-325 MG per tablet 1 tablet (has no administration in time range)  morphine (PF) 2 MG/ML injection 2 mg (has no administration in time range)  Chlorhexidine Gluconate Cloth 2 % PADS 6 each (6 each Topical Given 03/12/23 2130)  clonazePAM (KLONOPIN) tablet 0.5 mg (0.5 mg Oral Given 03/12/23 2228)  traZODone (DESYREL) tablet 50 mg (50 mg Oral Given 03/12/23 2228)  sodium chloride 0.9 % bolus 1,000 mL (0 mLs Intravenous Stopped 03/12/23 1657)  heparin bolus via infusion 4,500 Units (4,500 Units Intravenous Bolus from Bag 03/12/23 1551)  iohexol (OMNIPAQUE) 350 MG/ML injection 75 mL (75 mLs Intravenous Contrast Given 03/12/23 1717)  0.9 %  sodium chloride infusion ( Intravenous New  Bag/Given 03/12/23 1944)   Review of Systems  Cardiovascular:  Positive for leg swelling.  Musculoskeletal:        Left leg pain/ swelling/numbness / weakness   Neurological:  Positive for weakness.  All other systems reviewed and are negative.  Past Medical History:  Diagnosis Date   Anxiety    Arthritis    back   Dehydration    Depression    GERD (gastroesophageal reflux disease)    Hypertension    controlled on meds   Hypotension    Pre-diabetes    SIRS (systemic inflammatory response syndrome) (HCC)    Wears  partial dentures    upper   Past Surgical History:  Procedure Laterality Date   ABDOMINAL HYSTERECTOMY     CATARACT EXTRACTION W/PHACO Right 03/21/2016   Procedure: CATARACT EXTRACTION PHACO AND INTRAOCULAR LENS PLACEMENT (IOC);  Surgeon: Sherald Hess, MD;  Location: [facility];  Service: Ophthalmology;  Laterality: Right;  RIGHT   CATARACT EXTRACTION W/PHACO Left 05/09/2016   Procedure: CATARACT EXTRACTION PHACO AND INTRAOCULAR LENS PLACEMENT (IOC)  Left;  Surgeon: Sherald Hess, MD;  Location: [facility];  Service: Ophthalmology;  Laterality: Left;   CERVICAL SPINE SURGERY  2009   discectomy   COLONOSCOPY     COLONOSCOPY WITH PROPOFOL N/A 01/25/2022   Procedure: COLONOSCOPY WITH PROPOFOL;  Surgeon: Regis Bill, MD;  Location: [facility];  Service: Endoscopy;  Laterality: N/A;   KYPHOPLASTY N/A 01/18/2018   Procedure: [id number];  Surgeon: Kennedy Bucker, MD;  Location: [facility];  Service: Orthopedics;  Laterality: N/A;   LUMBAR LAMINECTOMY/ DECOMPRESSION WITH MET-RX Left 04/22/2019   Procedure: LEFT LATERAL L4-5 DISCECTOMY WITH METRX;  Surgeon: Lucy Chris, MD;  Location: [facility];  Service: Neurosurgery;  Laterality: Left;    reports that she quit smoking about 39 years ago. Her smoking use included cigarettes. She started smoking about 59 years ago. She has a 20 pack-year smoking history. She has never used smokeless tobacco. She reports that she does not drink alcohol and does not use drugs.  Allergies  Allergen Reactions   Prednisone Other (See Comments)    Severe depression   Family History  Problem Relation Age of Onset   Breast cancer Sister 66   Prior to Admission medications   Medication Sig Start Date End Date Taking? Authorizing Provider  clonazePAM (KLONOPIN) 0.5 MG tablet Take 0.5 mg by mouth at bedtime. TAKES 1 OR 2    [provider]  KLOR-CON M10 10 MEQ tablet Take 20 mEq by mouth 3 (three) times  daily. 02/23/19   [provider]  losartan-hydrochlorothiazide (HYZAAR) 100-12.5 MG tablet Take 1 tablet by mouth daily.     [provider]  metoprolol succinate (TOPROL-XL) 50 MG 24 hr tablet Take 75 mg by mouth daily. Take with or immediately following a meal.    [provider]  OLANZapine (ZYPREXA) 10 MG tablet Take 10 mg by mouth at bedtime.    [provider]  sertraline (ZOLOFT) 100 MG tablet Take 100 mg by mouth 2 (two) times daily.    [provider]  traZODone (DESYREL) 50 MG tablet Take 100 mg by mouth at bedtime.    [provider]  venlafaxine XR (EFFEXOR-XR) 75 MG 24 hr capsule Take 75 mg by mouth daily with breakfast.    [provider]  verapamil (CALAN-SR) 240 MG CR tablet Take 240 mg by mouth daily. 11/30/17   [provider]  vitamin B-12 (CYANOCOBALAMIN)  100 MCG tablet Take 100 mcg by mouth daily.    [provider]   Vitals:   03/12/23 1846 03/12/23 2000 03/12/23 2100 03/12/23 2130  BP:  137/62 (!) 144/82 (!) 147/82  Pulse:  84 99 96  Resp:  20 17 18   Temp: 97.8 F (36.6 C)   98.2 F (36.8 C)  TempSrc: Oral   Oral  SpO2:      Weight:    70.6 kg  Height:    5\' 2"  (1.575 m)   Physical Exam Vitals and nursing note reviewed.  Constitutional:      General: She is not in acute distress. HENT:     Head: Normocephalic and atraumatic.     Right Ear: Hearing normal.     Left Ear: Hearing normal.     Nose: Nose normal. No nasal deformity.     Mouth/Throat:     Lips: Pink.     Tongue: No lesions.     Pharynx: Oropharynx is clear.  Eyes:     General: Lids are normal.     Extraocular Movements: Extraocular movements intact.  Cardiovascular:     Rate and Rhythm: Normal rate and regular rhythm.     Heart sounds: Normal heart sounds.  Pulmonary:     Effort: Pulmonary effort is normal.     Breath sounds: Normal breath sounds.  Abdominal:     General: Bowel sounds are normal. There is no  distension.     Palpations: Abdomen is soft. There is no mass.     Tenderness: There is no abdominal tenderness.  Musculoskeletal:        General: Swelling and tenderness present.     Right lower leg: No edema.     Left lower leg: Edema present.  Skin:    General: Skin is warm.  Neurological:     General: No focal deficit present.     Mental Status: She is alert and oriented to person, place, and time.     Cranial Nerves: Cranial nerves 2-12 are intact.  Psychiatric:        Attention and Perception: Attention normal.        Mood and Affect: Mood normal.        Speech: Speech normal.        Behavior: Behavior normal. Behavior is cooperative.    Labs on Admission: I have personally reviewed following labs and imaging studies Results for orders placed or performed during the hospital encounter of 03/12/23 (from the past 24 hour(s))  CBC with Differential     Status: Abnormal   Collection Time: 03/12/23  2:44 PM  Result Value Ref Range   WBC 15.0 (H) 4.0 - 10.5 K/uL   RBC 4.04 3.87 - 5.11 MIL/uL   Hemoglobin 12.6 12.0 - 15.0 g/dL   HCT 16.1 09.6 - 04.5 %   MCV 97.8 80.0 - 100.0 fL   MCH 31.2 26.0 - 34.0 pg   MCHC 31.9 30.0 - 36.0 g/dL   RDW 40.9 81.1 - 91.4 %   Platelets 205 150 - 400 K/uL   nRBC 0.0 0.0 - 0.2 %   Neutrophils Relative % 88 %   Neutro Abs 13.2 (H) 1.7 - 7.7 K/uL   Lymphocytes Relative 6 %   Lymphs Abs 0.9 0.7 - 4.0 K/uL   Monocytes Relative 5 %   Monocytes Absolute 0.8 0.1 - 1.0 K/uL   Eosinophils Relative 0 %   Eosinophils Absolute 0.0 0.0 - 0.5 K/uL  Basophils Relative 0 %   Basophils Absolute 0.1 0.0 - 0.1 K/uL   Immature Granulocytes 1 %   Abs Immature Granulocytes 0.10 (H) 0.00 - 0.07 K/uL  Comprehensive metabolic panel     Status: Abnormal   Collection Time: 03/12/23  2:44 PM  Result Value Ref Range   Sodium 132 (L) 135 - 145 mmol/L   Potassium 4.7 3.5 - 5.1 mmol/L   Chloride 100 98 - 111 mmol/L   CO2 17 (L) 22 - 32 mmol/L   Glucose, Bld 138  (H) 70 - 99 mg/dL   BUN 47 (H) 8 - 23 mg/dL   Creatinine, Ser 6.57 (H) 0.44 - 1.00 mg/dL   Calcium 9.3 8.9 - 84.6 mg/dL   Total Protein 8.1 6.5 - 8.1 g/dL   Albumin 4.3 3.5 - 5.0 g/dL   AST 20 15 - 41 U/L   ALT 13 0 - 44 U/L   Alkaline Phosphatase 71 38 - 126 U/L   Total Bilirubin 0.9 <1.2 mg/dL   GFR, Estimated 18 (L) >60 mL/min   Anion gap 15 5 - 15  Lactic acid, plasma     Status: Abnormal   Collection Time: 03/12/23  2:54 PM  Result Value Ref Range   Lactic Acid, Venous 3.3 (HH) 0.5 - 1.9 mmol/L  Protime-INR     Status: Abnormal   Collection Time: 03/12/23  2:55 PM  Result Value Ref Range   Prothrombin Time 15.5 (H) 11.4 - 15.2 seconds   INR 1.2 0.8 - 1.2  APTT     Status: Abnormal   Collection Time: 03/12/23  2:57 PM  Result Value Ref Range   aPTT 23 (L) 24 - 36 seconds  Brain natriuretic peptide     Status: None   Collection Time: 03/12/23  3:47 PM  Result Value Ref Range   B Natriuretic Peptide 14.3 0.0 - 100.0 pg/mL  Lactic acid, plasma     Status: None   Collection Time: 03/12/23  3:48 PM  Result Value Ref Range   Lactic Acid, Venous 1.2 0.5 - 1.9 mmol/L  Troponin I (High Sensitivity)     Status: None   Collection Time: 03/12/23  3:48 PM  Result Value Ref Range   Troponin I (High Sensitivity) 5 <18 ng/L  Troponin I (High Sensitivity)     Status: None   Collection Time: 03/12/23  6:41 PM  Result Value Ref Range   Troponin I (High Sensitivity) 5 <18 ng/L    CBC: Recent Labs  Lab 03/12/23 1444  WBC 15.0*  NEUTROABS 13.2*  HGB 12.6  HCT 39.5  MCV 97.8  PLT 205   Basic Metabolic Panel: Recent Labs  Lab 03/12/23 1444  NA 132*  K 4.7  CL 100  CO2 17*  GLUCOSE 138*  BUN 47*  CREATININE 2.77*  CALCIUM 9.3   GFR: Estimated Creatinine Clearance: 16.6 mL/min (A) (by C-G formula based on SCr of 2.77 mg/dL (H)). Liver Function Tests: Recent Labs  Lab 03/12/23 1444  AST 20  ALT 13  ALKPHOS 71  BILITOT 0.9  PROT 8.1  ALBUMIN 4.3   Coagulation  Profile: Recent Labs  Lab 03/12/23 1455  INR 1.2    Unresulted Labs (From admission, onward)     Start     Ordered   03/13/23 0500  Comprehensive metabolic panel  Tomorrow morning,   R        03/12/23 2037   03/13/23 0500  CBC  Tomorrow morning,   R  03/12/23 2037   03/13/23 0000  Heparin level (unfractionated)  Once-Timed,   URGENT        03/12/23 1537   03/12/23 2108  MRSA Next Gen by PCR, Nasal  Once,   R        03/12/23 2108   03/12/23 1650  Lactic acid, plasma  (Lactic Acid)  Once-Timed,   STAT        03/12/23 1550            Medications  heparin ADULT infusion 100 units/mL (25000 units/259mL) (1,000 Units/hr Intravenous New Bag/Given 03/12/23 1551)  pantoprazole (PROTONIX) injection 40 mg (40 mg Intravenous Given 03/12/23 1943)  OLANZapine (ZYPREXA) tablet 10 mg (10 mg Oral Given 03/12/23 2228)  sertraline (ZOLOFT) tablet 200 mg (has no administration in time range)  sodium chloride flush (NS) 0.9 % injection 3 mL (3 mLs Intravenous Given 03/12/23 2134)  acetaminophen (TYLENOL) tablet 650 mg (has no administration in time range)    Or  acetaminophen (TYLENOL) suppository 650 mg (has no administration in time range)  HYDROcodone-acetaminophen (NORCO/VICODIN) 5-325 MG per tablet 1 tablet (has no administration in time range)  morphine (PF) 2 MG/ML injection 2 mg (has no administration in time range)  Chlorhexidine Gluconate Cloth 2 % PADS 6 each (6 each Topical Given 03/12/23 2130)  clonazePAM (KLONOPIN) tablet 0.5 mg (0.5 mg Oral Given 03/12/23 2228)  traZODone (DESYREL) tablet 50 mg (50 mg Oral Given 03/12/23 2228)  sodium chloride 0.9 % bolus 1,000 mL (0 mLs Intravenous Stopped 03/12/23 1657)  heparin bolus via infusion 4,500 Units (4,500 Units Intravenous Bolus from Bag 03/12/23 1551)  iohexol (OMNIPAQUE) 350 MG/ML injection 75 mL (75 mLs Intravenous Contrast Given 03/12/23 1717)  0.9 %  sodium chloride infusion ( Intravenous New Bag/Given 03/12/23 1944)     Radiological Exams on Admission: CT Angio Chest PE W and/or Wo Contrast  Addendum Date: 03/12/2023   ADDENDUM REPORT: 03/12/2023 20:46 ADDENDUM: These results were called by telephone at the time of interpretation on 03/12/2023 at 6:44 pm to provider Dr. Vicente Males, who verbally acknowledged these results. Electronically Signed   By: Darliss Cheney M.D.   On: 03/12/2023 20:46   Result Date: 03/12/2023 CLINICAL DATA:  High probability for PE. EXAM: CT ANGIOGRAPHY CHEST WITH CONTRAST TECHNIQUE: Multidetector CT imaging of the chest was performed using the standard protocol during bolus administration of intravenous contrast. Multiplanar CT image reconstructions and MIPs were obtained to evaluate the vascular anatomy. RADIATION DOSE REDUCTION: This exam was performed according to the departmental dose-optimization program which includes automated exposure control, adjustment of the mA and/or kV according to patient size and/or use of iterative reconstruction technique. CONTRAST:  75mL OMNIPAQUE IOHEXOL 350 MG/ML SOLN COMPARISON:  None Available. FINDINGS: Cardiovascular: There are pulmonary emboli in the distal right main pulmonary artery extending into segmental branches of the right middle lobe, right lower lobe and right upper lobe. There also smaller pulmonary emboli within segmental branches of the left upper lobe and left lower lobe. Heart and aorta are normal in size. There is no pericardial effusion. There are atherosclerotic calcifications of the aorta and coronary arteries. Mediastinum/Nodes: No enlarged mediastinal, hilar, or axillary lymph nodes. Thyroid gland, trachea, and esophagus demonstrate no significant findings. There is a small hiatal hernia. Lungs/Pleura: There is a 2 mm ground-glass nodule in the right upper lobe image 6/15. The lungs are otherwise clear. There is no pleural effusion or pneumothorax. Upper Abdomen: No acute abnormality. Musculoskeletal: Cervical spinal fusion hardware is  present.  L1 vertebroplasty changes are present. Review of the MIP images confirms the above findings. IMPRESSION: 1. Bilateral pulmonary emboli, right greater than left. No evidence for right heart strain. 2. 2 mm ground-glass nodule in the right upper lobe. No follow-up needed if patient is low-risk.This recommendation follows the consensus statement: Guidelines for Management of Incidental Pulmonary Nodules Detected on CT Images: From the Fleischner Society 2017; Radiology 2017; 284:228-243. 3. Small hiatal hernia. 4. Aortic atherosclerosis. Aortic Atherosclerosis (ICD10-I70.0). Electronically Signed: By: Darliss Cheney M.D. On: 03/12/2023 18:40   US Venous Img Lower Unilateral Left  Result Date: 03/12/2023 CLINICAL DATA:  Left lower extremity swelling and discoloration. Evaluate for DVT. EXAM: LEFT LOWER EXTREMITY VENOUS DOPPLER ULTRASOUND TECHNIQUE: Gray-scale sonography with graded compression, as well as color Doppler and duplex ultrasound were performed to evaluate the lower extremity deep venous systems from the level of the common femoral vein and including the common femoral, femoral, profunda femoral, popliteal and calf veins including the posterior tibial, peroneal and gastrocnemius veins when visible. The superficial great saphenous vein was also interrogated. Spectral Doppler was utilized to evaluate flow at rest and with distal augmentation maneuvers in the common femoral, femoral and popliteal veins. COMPARISON:  None Available. FINDINGS: Contralateral Common Femoral Vein: Respiratory phasicity is normal and symmetric with the symptomatic side. No evidence of thrombus. Normal compressibility. There is hypoechoic occlusive thrombus involving the left common femoral vein (images 2 and 3), extending to involve the saphenofemoral junction as well as the proximal aspect of the left greater saphenous vein (image 6). There is hypoechoic occlusive thrombus involving the imaged portions of the left deep  femoral vein (image 8 and 10). There is hypoechoic occlusive thrombus involving the proximal (image 8 and 12), mid (images 13 and 14) and distal (images 16 and 17) aspects of the left femoral vein There is hypoechoic occlusive thrombus involving the left popliteal vein (images 20 and 21, extending to involve both paired divisions of the left posterior tibial vein (image 29 and 30 as well as both paired divisions of the left peroneal vein (images 31 and 32). IMPRESSION: The examination is positive for extensive occlusive DVT extending from the left common femoral vein through the imaged tibial veins. Electronically Signed   By: Simonne Come M.D.   On: 03/12/2023 15:54     Data Reviewed: Relevant notes from primary care and specialist visits, past discharge summaries as available in EHR, including Care Everywhere. Prior diagnostic testing as pertinent to current admission diagnoses Updated medications and problem lists for reconciliation ED course, including vitals, labs, imaging, treatment and response to treatment Triage notes, nursing and pharmacy notes and ED provider's notes Notable results as noted in HPI  Assessment and Plan: * Leg pain, left -Left leg pain x 10 days or so / no trauma progressing to leg swelling.  Supportive care/ prn pain control  with tylenol  and t/t of dvt. ROM is wnl. Leg is 2 times the size of  right leg.    DVT (deep venous thrombosis) (HCC) Cont heparin gtt.    Acute pulmonary embolism (HCC) With patient presentation of shock/ dvt and pe suspect pt has right heart strain. We will get echo stat if possible.  Vascular is planning for thrombectomy in am.   Pt's presentation is concerning for right heart involvement. Son and spouse at bedside, discussed with them the presentation of the patient the risk factors of the patient for having a blood clot in her leg as well as her lungs,  patient's prognosis is critical.  I am concerned about blood clot involving her  heart. Inform family that the plan will be to start patient on blood thinners tonight monitoring the patient getting an ultrasound of the heart getting specialist involved that she may need to have a procedure to have the clot removed depending what vascular recommends.  Discussed with them that most likely etiology for her clot is the COVID infection that she had, but son states that mom and dad also take frequent trips. N.p.o. after midnight, continuous pulse oximetry, continuous card monitoring. Per vascular will request PCCM consult, and admit patient to stepdown unit.  Acute hypotension Vitals:   03/12/23 1437 03/12/23 1449 03/12/23 1500 03/12/23 1600  BP: (!) 63/38 91/61 (!) 106/56 100/62   03/12/23 1630 03/12/23 1700 03/12/23 1830  BP: 119/62 106/67 131/72  Hold all blood pressure medications, patient is on losartan HCTZ, metoprolol, verapamil.   Absent pedal pulses On initial presentation and ED report patient was said to have no pedal pulses which initially was concerning for limb ischemia however based on patient's history I do not suspect patient has limb ischemia.  The absent pedal pulses are the only dopplerable pulses found on initial presentation was secondary to patient being in shock with initial systolic of 63/38.  Acute kidney injury superimposed on stage 4 chronic kidney disease (HCC) Lab Results  Component Value Date   CREATININE 2.77 (H) 03/12/2023   CREATININE 1.19 (H) 02/23/2023   CREATININE 1.14 (H) 08/18/2021  Patient received CT angio study of the chest which was essential to her care today. Will start patient on normal saline at low rate and follow clear creatinine closely and renally dose additional medications.  Hold diuretic therapy hold losartan.   Asthma, chronic Stable, no wheezing on exam. As needed albuterol as needed.    Prognosis: Fair.   Unit: Stepdown.   DVT prophylaxis:  Heparin gtt.  Consults:  Vascular :Dr.Esco.   Advance Care  Planning:    Code Status: Full Code   Family Communication:  Son/ spouse.   Disposition Plan:  Home   Severity of Illness: The appropriate patient status for this patient is INPATIENT. Inpatient status is judged to be reasonable and necessary in order to provide the required intensity of service to ensure the patient's safety. The patient's presenting symptoms, physical exam findings, and initial radiographic and laboratory data in the context of their chronic comorbidities is felt to place them at high risk for further clinical deterioration. Furthermore, it is not anticipated that the patient will be medically stable for discharge from the hospital within 2 midnights of admission.   * I certify that at the point of admission it is my clinical judgment that the patient will require inpatient hospital care spanning beyond 2 midnights from the point of admission due to high intensity of service, high risk for further deterioration and high frequency of surveillance required.*  Author: Gertha Calkin, MD 03/12/2023 10:32 PM  For on call review [email or web address].  Orders Placed This Encounter  Procedures   MRSA Next Gen by PCR, Nasal    Standing Status:   Standing    Number of Occurrences:   1   US Venous Img Lower Unilateral Left    Standing Status:   Standing    Number of Occurrences:   1    Order Specific Question:   Reason for Exam (SYMPTOM  OR DIAGNOSIS REQUIRED)    Answer:   swelling, discoloration   CT Angio  Chest PE W and/or Wo Contrast    Standing Status:   Standing    Number of Occurrences:   1    Order Specific Question:   Does the patient have a contrast media/X-ray dye allergy?    Answer:   No    Order Specific Question:   If indicated for the ordered procedure, I authorize the administration of contrast media per Radiology protocol    Answer:   Yes    Order Specific Question:   Radiology Contrast Protocol - do NOT remove file path    Answer:    [email or web address]   CBC with Differential    Standing Status:   Standing    Number of Occurrences:   1   Comprehensive metabolic panel    Standing Status:   Standing    Number of Occurrences:   1   Lactic acid, plasma    Standing Status:   Standing    Number of Occurrences:   2   Protime-INR    Standing Status:   Standing    Number of Occurrences:   1   Brain natriuretic peptide    Standing Status:   Standing    Number of Occurrences:   1   Heparin level (unfractionated)    Standing Status:   Standing    Number of Occurrences:   1   APTT    Standing Status:   Standing    Number of Occurrences:   1   Lactic acid, plasma    Standing Status:   Standing    Number of Occurrences:   1   Comprehensive metabolic panel    Standing Status:   Standing    Number of Occurrences:   1   CBC    Standing Status:   Standing    Number of Occurrences:   1   Diet NPO time specified    Standing Status:   Standing    Number of Occurrences:   1   Verify informed consent    Standing Status:   Standing    Number of Occurrences:   1   Informed Consent Details: Physician/Practitioner Attestation; Transcribe to consent form and obtain patient signature    Standing Status:   Standing    Number of Occurrences:   1    Order Specific Question:   Physician/Practitioner attestation of informed consent for procedure/surgical case    Answer:   I, the physician/practitioner, attest that I have discussed with the patient the benefits, risks, side effects, alternatives, likelihood of achieving goals and potential problems during recovery for the procedure that I have provided informed consent.    Order Specific Question:   Procedure    Answer:   Pulmonary Thrombectomy; Left lower extremity thrombectomy/thrombolysis    Order Specific Question:   Physician/Practitioner performing the procedure    Answer:   Dr. Wyn Quaker    Order Specific Question:   Indication/Reason    Answer:    Pulmonary Embolism; Left lower extremity DVT   Maintain IV access    Standing Status:   Standing    Number of Occurrences:   1   Vital signs    Standing Status:   Standing    Number of Occurrences:   1   Notify physician (specify)    Standing Status:   Standing    Number of Occurrences:   20    Order Specific Question:   Notify Physician    Answer:   for pulse less than 55  or greater than 120    Order Specific Question:   Notify Physician    Answer:   for respiratory rate less than 12 or greater than 25    Order Specific Question:   Notify Physician    Answer:   for temperature greater than 100.5 F    Order Specific Question:   Notify Physician    Answer:   for urinary output less than 30 mL/hr for four hours    Order Specific Question:   Notify Physician    Answer:   for systolic BP less than 90 or greater than 160, diastolic BP less than 60 or greater than 100    Order Specific Question:   Notify Physician    Answer:   for new hypoxia w/ oxygen saturations < 88%   Mobility Protocol: No Restrictions    RN to initiate protocols based on patient's level of care    Standing Status:   Standing    Number of Occurrences:   1   Refer to Sidebar Report Refer to ICU, Med-Surg, Progressive, and Step-Down Mobility Protocol Sidebars    Refer to ICU, Med-Surg, Progressive, and Step-Down Mobility Protocol Sidebars    Standing Status:   Standing    Number of Occurrences:   1   Daily weights    Standing Status:   Standing    Number of Occurrences:   1   Intake and Output    Standing Status:   Standing    Number of Occurrences:   1   Do not place and if present remove PureWick    Standing Status:   Standing    Number of Occurrences:   1   Initiate Oral Care Protocol    Standing Status:   Standing    Number of Occurrences:   1   Initiate Carrier Fluid Protocol    Standing Status:   Standing    Number of Occurrences:   1   RN may order General Admission PRN Orders utilizing "General  Admission PRN medications" (through manage orders) for the following patient needs: allergy symptoms (Claritin), cold sores (Carmex), cough (Robitussin DM), eye irritation (Liquifilm Tears), hemorrhoids (Tucks), indigestion (Maalox), minor skin irritation (Hydrocortisone Cream), muscle pain (Ben Gay), nose irritation (saline nasal spray) and sore throat (Chloraseptic spray).    Standing Status:   Standing    Number of Occurrences:   [phone]   Cardiac Monitoring - Continuous Indefinite    Standing Status:   Standing    Number of Occurrences:   1    Order Specific Question:   Indications for use:    Answer:   ICU/Stepdown patient   Swab Process:     1.  Use the double-swab Venturi Transystem Transport Swab (green writing) to collect the specimen. 2.  Insert the dry culture swabs 1-2 cm into the patient's nostril and rotate the swabs against the inside of the nostril for 3 seconds while applying pressure with a finger to the outside of the nose. 3.  Repeat Step 2 in second nostril with the same swabs. 4.  Return the swabs to the tube. 5.  Label the specimen at the bedside and send immediately to the laboratory.    Standing Status:   Standing    Number of Occurrences:   1   Considerations:    If a patient has had ENT surgery or has facial trauma with packing in place, coordinate with the physician to obtain the nasal swabs.  Other body sites  may not be used to screen for MRSA.    Standing Status:   Standing    Number of Occurrences:   1   If MRSA PCR Screen is Positive:     Initiate Methicillin Resistant Staphylococcus aureus (MRSA) PCR Positive Standing Orders.    Standing Status:   Standing    Number of Occurrences:   1   Refer to Sidebar Report - CHG cloths Sidebar    - CHG cloths Sidebar    Standing Status:   Standing    Number of Occurrences:   1   Patient Education: - [facility] Daily CHG Bathing    Standing Status:   Standing    Number of Occurrences:   1    Order Specific Question:    Specify    Answer:   - [facility] CHG Bathing   Full code    Standing Status:   Standing    Number of Occurrences:   1    Order Specific Question:   By:    Answer:   Other   heparin per pharmacy consult    Standing Status:   Standing    Number of Occurrences:   1    Order Specific Question:   Indication:    Answer:   VTE Treatment   Consult to hospitalist    Standing Status:   Standing    Number of Occurrences:   1    Order Specific Question:   Place call to:    Answer:   [id number]    Order Specific Question:   Reason for Consult    Answer:   Admit   Consult to vascular surgery Consult Timeframe: URGENT - requires response within 12 hours; URGENT timeframe requires provider to provider communication, has the provider to provider communication been completed: Yes; Reason for Consult? dvt/pe/ su...    Standing Status:   Standing    Number of Occurrences:   1    Order Specific Question:   Consult Timeframe    Answer:   URGENT - requires response within 12 hours    Order Specific Question:   URGENT timeframe requires provider to provider communication, has the provider to provider communication been completed    Answer:   Yes    Order Specific Question:   Reason for Consult?    Answer:   dvt/pe/ suspect right heart strain.   Pulse oximetry check with vital signs    Standing Status:   Standing    Number of Occurrences:   1   Oxygen therapy Mode or (Route): Nasal cannula; Liters Per Minute: 2; Keep O2 saturation between: greater than 92 %    Standing Status:   Standing    Number of Occurrences:   20    Order Specific Question:   Mode or (Route)    Answer:   Nasal cannula    Order Specific Question:   Liters Per Minute    Answer:   2    Order Specific Question:   Keep O2 saturation between    Answer:   greater than 92 %   EKG 12-Lead    Standing Status:   Standing    Number of Occurrences:   1   ECHOCARDIOGRAM COMPLETE BUBBLE STUDY    Standing Status:   Standing    Number of  Occurrences:   1    Order Specific Question:   Perflutren DEFINITY (image enhancing agent) should be administered unless hypersensitivity or allergy exist    Answer:  Administer Perflutren    Order Specific Question:   Will [facility] be the location of this test?    Answer:   Yes    Order Specific Question:   Please indicate who you request to read the nuc med / echo results.    Answer:   [facility] Unassigned    Order Specific Question:   Reason for exam    Answer:   Other-Full Diagnosis List    Order Specific Question:   Full ICD-10/Reason for Exam    Answer:   PE (pulmonary thromboembolism) (HCC) [161096]    Order Specific Question:   Full ICD-10/Reason for Exam    Answer:   DVT (deep venous thrombosis) (HCC) [045409]    Order Specific Question:   Full ICD-10/Reason for Exam    Answer:   Hypotension [811914]   Admit to Inpatient (patient's expected length of stay will be greater than 2 midnights or inpatient only procedure)    Standing Status:   Standing    Number of Occurrences:   1    Order Specific Question:   Hospital Area    Answer:   [facility] [[id number]]    Order Specific Question:   Level of Care    Answer:   Stepdown [14]    Order Specific Question:   Covid Evaluation    Answer:   Asymptomatic - no recent exposure (last 10 days) testing not required    Order Specific Question:   Diagnosis    Answer:   PE (pulmonary thromboembolism) [facility]) [782956]    Order Specific Question:   Admitting Physician    Answer:   Darrold Junker    Order Specific Question:   Attending Physician    Answer:   Darrold Junker    Order Specific Question:   Certification:    Answer:   I certify this patient will need inpatient services for at least 2 midnights    Order Specific Question:   Expected Medical Readiness    Answer:   03/15/2023   Admit to Inpatient (patient's expected length of stay will be greater than 2 midnights or inpatient only procedure)     Standing Status:   Standing    Number of Occurrences:   1    Order Specific Question:   Hospital Area    Answer:   [facility] [[id number]]    Order Specific Question:   Level of Care    Answer:   Progressive [102]    Order Specific Question:   Admit to Progressive based on following criteria    Answer:   Other see comments    Order Specific Question:   Comments    Answer:   dvt/ pe.    Order Specific Question:   Covid Evaluation    Answer:   Asymptomatic - no recent exposure (last 10 days) testing not required    Order Specific Question:   Diagnosis    Answer:   PE (pulmonary thromboembolism) (HCC) [213086]    Order Specific Question:   Admitting Physician    Answer:   Darrold Junker    Order Specific Question:   Attending Physician    Answer:   Darrold Junker    Order Specific Question:   Certification:    Answer:   I certify this patient will need inpatient services for at least 2 midnights    Order Specific Question:   Expected Medical Readiness  Answer:   03/15/2023   Aspiration precautions    Standing Status:   Standing    Number of Occurrences:   1   Fall precautions    Standing Status:   Standing    Number of Occurrences:   1

## 2023-03-12 NOTE — ED Triage Notes (Signed)
Pt in via EMS from home with c/o  Pt woke up yesterday and felt pain to her left leg and as the day went on she started swelling. Pt reports swelling is worse today. Her family was going to bring her but she was not able to stand so they called EMS. 104/60, HR 94, 96% RA.

## 2023-03-12 NOTE — Assessment & Plan Note (Signed)
-  Left leg pain x 10 days or so / no trauma progressing to leg swelling.  Supportive care/ prn pain control  with tylenol  and t/t of dvt. ROM is wnl. Leg is 2 times the size of  right leg.

## 2023-03-12 NOTE — Assessment & Plan Note (Signed)
Vitals:   03/12/23 1437 03/12/23 1449 03/12/23 1500 03/12/23 1600  BP: (!) 63/38 91/61 (!) 106/56 100/62   03/12/23 1630 03/12/23 1700 03/12/23 1830  BP: 119/62 106/67 131/72  Hold all blood pressure medications, patient is on losartan HCTZ, metoprolol, verapamil.

## 2023-03-12 NOTE — ED Triage Notes (Signed)
Pt states coming in pain to the left leg for 1 week and swelling that started yesterday. Pt is not on blood thinners. Faint DP pulse to the left leg. Left leg is purple.

## 2023-03-12 NOTE — Assessment & Plan Note (Signed)
Stable, no wheezing on exam. As needed albuterol as needed.

## 2023-03-13 ENCOUNTER — Encounter: Payer: Self-pay | Admitting: Internal Medicine

## 2023-03-13 ENCOUNTER — Encounter
Admission: EM | Disposition: A | Discharge status: Home / Self Care | Payer: Self-pay | Source: Home / Self Care | Attending: Osteopathic Medicine

## 2023-03-13 DIAGNOSIS — I2699 Other pulmonary embolism without acute cor pulmonale: Principal | ICD-10-CM

## 2023-03-13 DIAGNOSIS — I82412 Acute embolism and thrombosis of left femoral vein: Secondary | ICD-10-CM | POA: Diagnosis not present

## 2023-03-13 DIAGNOSIS — I871 Compression of vein: Secondary | ICD-10-CM

## 2023-03-13 DIAGNOSIS — I2609 Other pulmonary embolism with acute cor pulmonale: Secondary | ICD-10-CM

## 2023-03-13 DIAGNOSIS — M79605 Pain in left leg: Secondary | ICD-10-CM | POA: Diagnosis not present

## 2023-03-13 DIAGNOSIS — Z95828 Presence of other vascular implants and grafts: Secondary | ICD-10-CM

## 2023-03-13 DIAGNOSIS — Z9889 Other specified postprocedural states: Secondary | ICD-10-CM

## 2023-03-13 DIAGNOSIS — I82422 Acute embolism and thrombosis of left iliac vein: Secondary | ICD-10-CM

## 2023-03-13 DIAGNOSIS — I82220 Acute embolism and thrombosis of inferior vena cava: Secondary | ICD-10-CM

## 2023-03-13 HISTORY — PX: PULMONARY THROMBECTOMY: CATH118295

## 2023-03-13 LAB — COMPREHENSIVE METABOLIC PANEL
ALT: 11 U/L (ref 0–44)
AST: 12 U/L — ABNORMAL LOW (ref 15–41)
Albumin: 3.5 g/dL (ref 3.5–5.0)
Alkaline Phosphatase: 61 U/L (ref 38–126)
Anion gap: 8 (ref 5–15)
BUN: 43 mg/dL — ABNORMAL HIGH (ref 8–23)
CO2: 21 mmol/L — ABNORMAL LOW (ref 22–32)
Calcium: 8.4 mg/dL — ABNORMAL LOW (ref 8.9–10.3)
Chloride: 106 mmol/L (ref 98–111)
Creatinine, Ser: 1.8 mg/dL — ABNORMAL HIGH (ref 0.44–1.00)
GFR, Estimated: 29 mL/min — ABNORMAL LOW (ref >60)
Glucose, Bld: 97 mg/dL (ref 70–99)
Potassium: 3.3 mmol/L — ABNORMAL LOW (ref 3.5–5.1)
Sodium: 135 mmol/L (ref 135–145)
Total Bilirubin: 0.5 mg/dL (ref <1.2)
Total Protein: 6.7 g/dL (ref 6.5–8.1)

## 2023-03-13 LAB — HEPARIN LEVEL (UNFRACTIONATED)
Heparin Unfractionated: 0.5 [IU]/mL (ref 0.30–0.70)
Heparin Unfractionated: 0.74 [IU]/mL — ABNORMAL HIGH (ref 0.30–0.70)
Heparin Unfractionated: 0.87 [IU]/mL — ABNORMAL HIGH (ref 0.30–0.70)

## 2023-03-13 LAB — CBC
HCT: 32.1 % — ABNORMAL LOW (ref 36.0–46.0)
Hemoglobin: 10.6 g/dL — ABNORMAL LOW (ref 12.0–15.0)
MCH: 31.4 pg (ref 26.0–34.0)
MCHC: 33 g/dL (ref 30.0–36.0)
MCV: 95 fL (ref 80.0–100.0)
Platelets: 169 10*3/uL (ref 150–400)
RBC: 3.38 MIL/uL — ABNORMAL LOW (ref 3.87–5.11)
RDW: 13.2 % (ref 11.5–15.5)
WBC: 11.1 10*3/uL — ABNORMAL HIGH (ref 4.0–10.5)
nRBC: 0 % (ref 0.0–0.2)

## 2023-03-13 LAB — GLUCOSE, CAPILLARY: Glucose-Capillary: 115 mg/dL — ABNORMAL HIGH (ref 70–99)

## 2023-03-13 SURGERY — PULMONARY THROMBECTOMY
Anesthesia: Moderate Sedation

## 2023-03-13 MED ORDER — FENTANYL CITRATE (PF) 100 MCG/2ML IJ SOLN
INTRAMUSCULAR | Status: DC | PRN
  Administered 2023-03-13: 25 ug via INTRAVENOUS
  Administered 2023-03-13: 50 ug via INTRAVENOUS

## 2023-03-13 MED ORDER — SODIUM CHLORIDE 0.9 % IV SOLN: INTRAVENOUS | Status: DC

## 2023-03-13 MED ORDER — LIDOCAINE-EPINEPHRINE (PF) 1 %-1:200000 IJ SOLN
INTRAMUSCULAR | Status: DC | PRN
  Administered 2023-03-13: 10 mL via INTRADERMAL

## 2023-03-13 MED ORDER — HEPARIN SODIUM (PORCINE) 1000 UNIT/ML IJ SOLN
INTRAMUSCULAR | Status: DC | PRN
  Administered 2023-03-13: 3000 [IU] via INTRAVENOUS

## 2023-03-13 MED ORDER — CEFAZOLIN SODIUM-DEXTROSE 2-4 GM/100ML-% IV SOLN
2.0000 g | INTRAVENOUS | Status: AC
  Administered 2023-03-13: 2 g via INTRAVENOUS
  Filled 2023-03-13: qty 100

## 2023-03-13 MED ORDER — METHYLPREDNISOLONE SODIUM SUCC 125 MG IJ SOLR: 125.0000 mg | Freq: Once | INTRAMUSCULAR | Status: DC | PRN

## 2023-03-13 MED ORDER — HEPARIN SODIUM (PORCINE) 1000 UNIT/ML IJ SOLN
INTRAMUSCULAR | Status: AC
  Filled 2023-03-13: qty 10

## 2023-03-13 MED ORDER — VERAPAMIL HCL ER 180 MG PO TBCR
180.0000 mg | EXTENDED_RELEASE_TABLET | Freq: Every day | ORAL | Status: DC
  Administered 2023-03-14 – 2023-03-15 (×2): 180 mg via ORAL
  Filled 2023-03-13 (×2): qty 1

## 2023-03-13 MED ORDER — VERAPAMIL HCL ER 180 MG PO TBCR: 180.0000 mg | EXTENDED_RELEASE_TABLET | Freq: Every day | ORAL | Status: DC

## 2023-03-13 MED ORDER — IODIXANOL 320 MG/ML IV SOLN
INTRAVENOUS | Status: DC | PRN
  Administered 2023-03-13: 65 mL via INTRAVENOUS

## 2023-03-13 MED ORDER — FENTANYL CITRATE PF 50 MCG/ML IJ SOSY: 12.5000 ug | PREFILLED_SYRINGE | Freq: Once | INTRAMUSCULAR | Status: DC | PRN

## 2023-03-13 MED ORDER — MIDAZOLAM HCL 2 MG/ML PO SYRP: 8.0000 mg | ORAL_SOLUTION | Freq: Once | ORAL | Status: DC | PRN

## 2023-03-13 MED ORDER — MIDAZOLAM HCL 2 MG/ML PO SYRP
8.0000 mg | ORAL_SOLUTION | Freq: Once | ORAL | Status: DC | PRN
  Filled 2023-03-13: qty 5

## 2023-03-13 MED ORDER — TRAZODONE HCL 100 MG PO TABS
100.0000 mg | ORAL_TABLET | Freq: Every day | ORAL | Status: DC
  Administered 2023-03-13 – 2023-03-14 (×2): 100 mg via ORAL
  Filled 2023-03-13 (×2): qty 1

## 2023-03-13 MED ORDER — MIDAZOLAM HCL 2 MG/2ML IJ SOLN
INTRAMUSCULAR | Status: DC | PRN
  Administered 2023-03-13 (×2): 1 mg via INTRAVENOUS

## 2023-03-13 MED ORDER — ONDANSETRON HCL 4 MG/2ML IJ SOLN: 4.0000 mg | Freq: Four times a day (QID) | INTRAMUSCULAR | Status: DC | PRN

## 2023-03-13 MED ORDER — POTASSIUM CHLORIDE CRYS ER 20 MEQ PO TBCR
40.0000 meq | EXTENDED_RELEASE_TABLET | Freq: Once | ORAL | Status: AC
  Administered 2023-03-13: 40 meq via ORAL
  Filled 2023-03-13: qty 2

## 2023-03-13 MED ORDER — DIPHENHYDRAMINE HCL 50 MG/ML IJ SOLN
50.0000 mg | Freq: Once | INTRAMUSCULAR | Status: DC | PRN
  Filled 2023-03-13: qty 1

## 2023-03-13 MED ORDER — CEFAZOLIN SODIUM-DEXTROSE 2-4 GM/100ML-% IV SOLN
2.0000 g | INTRAVENOUS | Status: AC
  Administered 2023-03-14: 2 g via INTRAVENOUS
  Filled 2023-03-13: qty 100

## 2023-03-13 MED ORDER — METOPROLOL SUCCINATE ER 50 MG PO TB24
50.0000 mg | ORAL_TABLET | Freq: Every day | ORAL | Status: DC
  Administered 2023-03-13 – 2023-03-15 (×3): 50 mg via ORAL
  Filled 2023-03-13 (×3): qty 1

## 2023-03-13 MED ORDER — ALTEPLASE 1 MG/ML SYRINGE FOR VASCULAR PROCEDURE
INTRAMUSCULAR | Status: DC | PRN
  Administered 2023-03-13: 6 mg via INTRA_ARTERIAL

## 2023-03-13 MED ORDER — FAMOTIDINE 20 MG PO TABS: 40.0000 mg | ORAL_TABLET | Freq: Once | ORAL | Status: DC | PRN

## 2023-03-13 MED ORDER — DIPHENHYDRAMINE HCL 50 MG/ML IJ SOLN: 50.0000 mg | Freq: Once | INTRAMUSCULAR | Status: DC | PRN

## 2023-03-13 MED ORDER — MIDAZOLAM HCL 5 MG/5ML IJ SOLN
INTRAMUSCULAR | Status: AC
  Filled 2023-03-13: qty 5

## 2023-03-13 MED ORDER — FENTANYL CITRATE (PF) 100 MCG/2ML IJ SOLN
INTRAMUSCULAR | Status: AC
  Filled 2023-03-13: qty 2

## 2023-03-13 MED ORDER — HEPARIN (PORCINE) IN NACL 1000-0.9 UT/500ML-% IV SOLN
INTRAVENOUS | Status: DC | PRN
  Administered 2023-03-13: 1000 mL

## 2023-03-13 MED ORDER — VENLAFAXINE HCL ER 75 MG PO CP24
75.0000 mg | ORAL_CAPSULE | Freq: Every day | ORAL | Status: DC
  Administered 2023-03-14 – 2023-03-15 (×2): 75 mg via ORAL
  Filled 2023-03-13 (×2): qty 1

## 2023-03-13 MED ORDER — CEFAZOLIN SODIUM-DEXTROSE 2-4 GM/100ML-% IV SOLN
INTRAVENOUS | Status: AC
  Filled 2023-03-13: qty 100

## 2023-03-13 SURGICAL SUPPLY — 17 items
CANISTER PENUMBRA ENGINE (MISCELLANEOUS) IMPLANT
CATH ANGIO 5F PIGTAIL 100CM (CATHETERS) IMPLANT
CATH INDIGO SEP 12 (CATHETERS) IMPLANT
CATH INFINITI JR4 5F (CATHETERS) IMPLANT
CATH LIGHTING BOLT 12 XT 100 (CATHETERS) IMPLANT
CLOSURE PERCLOSE PROSTYLE (VASCULAR PRODUCTS) IMPLANT
COVER DRAPE FLUORO 36X44 (DRAPES) IMPLANT
GLIDEWIRE ADV .035X180CM (WIRE) IMPLANT
KIT FEM OPTION ELITE FILTER (Filter) IMPLANT
PACK ANGIOGRAPHY (CUSTOM PROCEDURE TRAY) ×1 IMPLANT
SHEATH BRITE TIP 6FRX11 (SHEATH) IMPLANT
SHEATH CHCK-FLO 14FR 13 (SHEATH) IMPLANT
SUT MNCRL AB 4-0 PS2 18 (SUTURE) IMPLANT
SYR MEDRAD MARK 7 150ML (SYRINGE) IMPLANT
TUBING CONTRAST HIGH PRESS 72 (TUBING) IMPLANT
WIRE GUIDERIGHT .035X150 (WIRE) IMPLANT
WIRE SUPRACORE 300CM (WIRE) IMPLANT

## 2023-03-13 NOTE — Progress Notes (Signed)
Transition of Care [facility]) - Inpatient Brief Assessment   Patient Details  Name: Carmen [name] MRN: [id number] Date of Birth: 16-Mar-1951  Transition of Care [facility]) CM/SW Contact:    Truddie Hidden, RN Phone Number: 03/13/2023, 4:33 PM   Clinical Narrative: TOC continuing to follow patient's progress throughout discharge planning.   Transition of Care Asessment: Insurance and Status: Insurance coverage has been reviewed Patient has primary care physician: Yes Home environment has been reviewed: home Prior level of function:: independent Prior/Current Home Services: No current home services Social Determinants of Health Reivew: SDOH reviewed no interventions necessary Readmission risk has been reviewed: Yes Transition of care needs: no transition of care needs at this time

## 2023-03-13 NOTE — Consult Note (Signed)
PHARMACY - ANTICOAGULATION CONSULT NOTE  Pharmacy Consult for Heparin  Indication: VTE Treatment   Allergies  Allergen Reactions   Prednisone Other (See Comments)    Severe depression   Patient Measurements: Height: 5\' 2"  (157.5 cm) Weight: 70.6 kg (155 lb 10.3 oz) IBW/kg (Calculated) : 50.1 Heparin Dosing Weight: 64 kg   Vital Signs: Temp: 98.3 F (36.8 C) (11/25 0000) Temp Source: Oral (11/25 0000) BP: 141/71 (11/25 0000) Pulse Rate: 93 (11/25 0000)  Labs: Recent Labs    03/12/23 1444 03/12/23 1455 03/12/23 1457 03/12/23 1548 03/12/23 1841 03/12/23 2352  HGB 12.6  --   --   --   --   --   HCT 39.5  --   --   --   --   --   PLT 205  --   --   --   --   --   APTT  --   --  23*  --   --   --   LABPROT  --  15.5*  --   --   --   --   INR  --  1.2  --   --   --   --   HEPARINUNFRC  --   --   --   --   --  0.74*  CREATININE 2.77*  --   --   --   --   --   TROPONINIHS  --   --   --  5 5  --    Estimated Creatinine Clearance: 16.6 mL/min (A) (by C-G formula based on SCr of 2.77 mg/dL (H)).  Medical History: Past Medical History:  Diagnosis Date   Anxiety    Arthritis    back   Dehydration    Depression    GERD (gastroesophageal reflux disease)    Hypertension    controlled on meds   Hypotension    Pre-diabetes    SIRS (systemic inflammatory response syndrome) (HCC)    Wears partial dentures    upper   Medications:  No PTA anticoagulation   Assessment: Carmen [name] is a 72 year old female that presented with leg swelling and discoloration. Patient states that she has had some discomfort for about 1 week and noticed it was somewhat discolored today. From chart review, patient is not on any PTA anticoagulation. Imaging to rule out DVT and PE has been ordered. Pharmacy has been consulted for management of a heparin infusion. Baseline labs: Hgb 12.6, PLT 205, INR 1.2, aPTT pending.   Goal of Therapy:  Heparin level 0.3-0.7 units/ml Monitor platelets by  anticoagulation protocol: Yes   Plan:  11/25:  HL @ 2352 = 0.74, elevated - Will decrease heparin drip rate to 950 units/hr and recheck HL 8 hrs after rate change.   Madysyn Hanken D, PharmD 03/13/2023 12:34 AM

## 2023-03-13 NOTE — Progress Notes (Signed)
Pt is off unit for a procedure. Pts v/s are stable and pt has no c/o pain or discomfort.

## 2023-03-13 NOTE — Hospital Course (Addendum)
HPI: Carmen [name] is a 72 y.o. female with medical history significant for HTN, HLD, CKD3a, Cdiff colitis, prediabetes, depression/anxiety comes to the emergency room today for left leg swelling.  She woke up day prior and had pain in her left leg, progressively worse pain/swelling, eventually not able to stand or bear weight and EMS was called   Hospital course / significant events:  11/24: to ED, hypotensive in shock with systolic of 63/38 which improved (question measurement error?) in about 20 mins to 106/56, mild leukocytosis WBC 15, Cr elevated to 2.77 above baseline 1.0. (+)occlusive DVT L common femoral thru tibial veins. CTA (+)bilateral PE, R>L, no RV strain. Started heparin.  11/25: WBC, Cr trending down. Lactate normalized. Underwent pulmonary thrombectomy/ Plan for LLE DVT thrombectomy tomorrow   Consultants:  Vascular surgery   Procedures/Surgeries: 03/13/23 pulmonary thrombectomy      ASSESSMENT & PLAN:   Occlusive DVT L common femoral thru tibial veins.  Bilateral PE, R>L, no RV strain.  SIRS d/t above (brief tachycardia, leukocytosis, no apparent infection) Heparin w/ plan to transition to po anticoagulation at discharge  Vascular surgery plans for thrombectomy   AKI on CKD3a Cr 2.77 on admission 11/24 --> Cr 1.8 today 03/13/23  Monitor BMP Avoid nephrotoxic meds/contrast as able Holding antihypertensives   Sinus tachycardia Likely d/t rebound from missing beta blocker and calcium channel blocker   Leukocytosis Likely reactive Monitor CBC  HTN Holding home losartan-HCT, metoprolol, verapamil  GAD/MDD Continue home sertaline, olanzapine, clonazepam, trazodone  Hypokalemia Replace as needed Monitor BMP    overweight based on BMI: Body mass index is 28.71 kg/m.  Underweight - under 18.5  normal weight - 18.5 to 24.9 overweight - 25 to 29.9 obese - 30 or more   DVT prophylaxis: heparin IV fluids: no continuous IV fluids  Nutrition:  regular diet for now NPO midnight Central lines / invasive devices: none  Code Status: FULL CODE ACP documentation reviewed:  none on file in [facility]  TOC needs: none at this time, PT/OT to assess  Barriers to dispo / significant pending items: procedure tomorrow

## 2023-03-13 NOTE — Op Note (Signed)
[facility]  Percutaneous Study/Intervention Procedural Note   Date of Surgery: 03/13/2023,10:47 AM  Surgeon: Festus Barren  Pre-operative Diagnosis: Symptomatic bilateral pulmonary emboli, extensive left lower extremity DVT  Post-operative diagnosis:  Same  Procedure(s) Performed:  1.  Contrast injection right heart  2.  Thrombolysis with 6 mg of tPA into the right pulmonary arteries  3.  Mechanical thrombectomy to the right upper lobe, middle lobe, and lower lobe pulmonary arteries using the penumbra CAT 12 catheter and mechanical thrombectomy of the left lower lobe using the penumbra CAT 12 catheter  4.  Selective catheter placement right upper lobe, middle lobe, and lower lobe pulmonary arteries  5.  Selective catheter placement left upper lobe and lower lobe pulmonary arteries  6.  Inferior vena cava placement with an option Elite IVC filter    Anesthesia: Conscious sedation was administered under my direct supervision by the interventional radiology RN. IV Versed plus fentanyl were utilized. Continuous ECG, pulse oximetry and blood pressure was monitored throughout the entire procedure.  Versed and fentanyl were administered intravenously.  Conscious sedation was administered for a total of 45 minutes using 2 mg of Versed and 75 mcg of Fentanyl.  EBL: 250 cc  Sheath: 14 French right femoral vein  Contrast: 65 cc   Fluoroscopy Time: 14.3 minutes  Indications:  Patient presents with pulmonary emboli. The patient is symptomatic with hypoxemia and dyspnea on exertion.  There is evidence of right heart strain on the CT angiogram. The patient is otherwise a good candidate for intervention and even the long-term benefits pulmonary angiography with thrombolysis is offered. The risks and benefits are reviewed long-term benefits are discussed. All questions are answered patient agrees to proceed.  Procedure:  Deloria Broaden Harrisonis a 72 y.o. female who was identified  and appropriate procedural time out was performed.  The patient was then placed supine on the table and prepped and draped in the usual sterile fashion.  Ultrasound was used to evaluate the right common femoral vein.  It was patent, as it was echolucent and compressible.  A digital ultrasound image was acquired for the permanent record.  A Seldinger needle was used to access the right common femoral vein under direct ultrasound guidance.  A 0.035 J wire was advanced without resistance and a 6Fr sheath was placed. A proglide device was placed in a preclose fashion and then upsized to a 14 [place] sheath.    The Advantage wire and pigtail catheter were then negotiated into the right atrium and bolus injection of contrast was utilized to demonstrate the right ventricle and the pulmonary artery outflow. The Advantage wire and catheter were then negotiated into the the pulmonary arteries.  The left pulmonary artery was cannulated and advanced into the left upper lobe and left lower lobe for selective imaging. This demonstrated a minimal amount of thrombus in the left upper lobe pulmonary artery and a small to medium amount of thrombus in the left lower lobe pulmonary artery.  I then transitioned to the right side with the advantage wire and catheter and first cannulated the right lower lobe and then the right middle and upper lobes.  This demonstrated significant thrombus burden mostly in the right middle and lower lobes with a smaller amount of thrombus in the right upper lobe pulmonary artery.  3000 Units of heparin was then given and allowed to circulate.  TPA was reconstituted and delivered onto the table. A total of 6 milligrams of TPA was utilized, all  on the right side.    The Penumbra Cat 12 catheter was then advanced up into the pulmonary vasculature. The right lung was addressed first. Catheter was negotiated into the right upper lobe and mechanical thrombectomy was performed with the help of the  separator. Follow-up imaging demonstrated a good result and therefore the catheter was renegotiated into the right middle lobe pulmonary artery and again mechanical thrombectomy was performed. Passes were made with both the Penumbra catheter itself as well as introducing the separator.  Finally, I negotiated into the right lower lobe pulmonary artery and perform mechanical thrombectomy with the penumbra CAT 12 catheter and the separator.  Follow-up imaging was then performed.  Significant improvement was seen on the right side after pulmonary thrombectomy.  The Penumbra Cat 12 catheter was then negotiated to the opposite side. The left lung was then addressed. Catheter was negotiated into the left lower lobe pulmonary artery and mechanical thrombectomy was performed with the separator. Follow-up imaging demonstrated a good result.  I did not feel that the small amount of thrombus in the left upper lobe pulmonary artery was worth any blood loss from thrombectomy with her expected upcoming left lower extremity thrombectomy in the coming days.    I then removed the penumbra CAT 12 catheter and performed imaging through the right femoral sheath to evaluate the right iliac vein and IVC.  The right iliac vein was widely patent, but there was thrombus emanating from the left iliac vein up into the IVC not far from the renal veins.  The renal veins were at approximately the L1 level and the filter was then deployed just below the renal veins at the L1-L2 level.  An option Elite IVC filter was used.  After review these images, wires were reintroduced and the catheter is removed. Then, the sheath is then pulled, the proglide device is secured, a Monocryl skin suture was placed and pressure is held. A sterile dressing is placed.    Findings:   Right heart imaging:  Right atrium and right ventricle and the pulmonary outflow tract appears fairly normal  Right lung:  This demonstrated significant thrombus burden  mostly in the right middle and lower lobes with a smaller amount of thrombus in the right upper lobe pulmonary artery.  Left lung:  This demonstrated a minimal amount of thrombus in the left upper lobe pulmonary artery and a small to medium amount of thrombus in the left lower lobe pulmonary artery.     Disposition: Patient was taken to the recovery room in stable condition having tolerated the procedure well.  Joelee Snoke 03/13/2023,10:47 AM

## 2023-03-13 NOTE — Interval H&P Note (Signed)
History and Physical Interval Note:  03/13/2023 9:15 AM  Carmen [name]  has presented today for surgery, with the diagnosis of Pulmonary embolism.  The various methods of treatment have been discussed with the patient and family. After consideration of risks, benefits and other options for treatment, the patient has consented to  Procedure(s): PULMONARY THROMBECTOMY (N/A) as a surgical intervention.  The patient's history has been reviewed, patient examined, no change in status, stable for surgery.  I have reviewed the patient's chart and labs.  Questions were answered to the patient's satisfaction.     Festus Barren

## 2023-03-13 NOTE — Progress Notes (Signed)
Pt had pulmonary thrombectomy today with 65ml contrast gfr was 29 at 0300am  scheduled for lower extremity thrombectomy with dr. Gilda Crease tomorrow notified dr. Wyn Quaker and orders for metb in am

## 2023-03-13 NOTE — Consult Note (Signed)
Hospital Consult    Reason for Consult:  Pulmonary Embolism Requesting Physician:  Dr Minna Antis MD MRN #:  [id number]  History of Present Illness: This is a 72 y.o. female with medical history significant for hypertension, depression, anxiety comes to the emergency room today for left leg swelling.  Per nurses initial triage note patient woke up yesterday and had pain in her left leg and as the day went on she started to have swelling which got progressively worse patient was not able to stand or bear weight and EMS was called and initial vitals of 104/60 heart rate 94 O2 sats of 96% on room air.   On exam this morning patient is resting comfortably in bed.  Patient's husband and son at the bedside.  Patient is not requiring any oxygen with oxygen saturations at 96%.  Family informs [place] that she has difficulty walking with gait and balance issues and uses a walker almost all the time.  Therefore she does not move much and has been sitting a lot lately according to her son.  Vascular surgery was consulted to evaluate bilateral pulmonary embolisms with left lower extremity DVT.  Past Medical History:  Diagnosis Date   Anxiety    Arthritis    back   Dehydration    Depression    GERD (gastroesophageal reflux disease)    Hypertension    controlled on meds   Hypotension    Pre-diabetes    SIRS (systemic inflammatory response syndrome) (HCC)    Wears partial dentures    upper    Past Surgical History:  Procedure Laterality Date   ABDOMINAL HYSTERECTOMY     CATARACT EXTRACTION W/PHACO Right 03/21/2016   Procedure: CATARACT EXTRACTION PHACO AND INTRAOCULAR LENS PLACEMENT (IOC);  Surgeon: Sherald Hess, MD;  Location: [facility];  Service: Ophthalmology;  Laterality: Right;  RIGHT   CATARACT EXTRACTION W/PHACO Left 05/09/2016   Procedure: CATARACT EXTRACTION PHACO AND INTRAOCULAR LENS PLACEMENT (IOC)  Left;  Surgeon: Sherald Hess, MD;  Location: [facility];  Service: Ophthalmology;  Laterality: Left;   CERVICAL SPINE SURGERY  2009   discectomy   COLONOSCOPY     COLONOSCOPY WITH PROPOFOL N/A 01/25/2022   Procedure: COLONOSCOPY WITH PROPOFOL;  Surgeon: Regis Bill, MD;  Location: [facility] ENDOSCOPY;  Service: Endoscopy;  Laterality: N/A;   KYPHOPLASTY N/A 01/18/2018   Procedure: [id number];  Surgeon: Kennedy Bucker, MD;  Location: [facility] ORS;  Service: Orthopedics;  Laterality: N/A;   LUMBAR LAMINECTOMY/ DECOMPRESSION WITH MET-RX Left 04/22/2019   Procedure: LEFT LATERAL L4-5 DISCECTOMY WITH METRX;  Surgeon: Lucy Chris, MD;  Location: [facility] ORS;  Service: Neurosurgery;  Laterality: Left;    Allergies  Allergen Reactions   Prednisone Other (See Comments)    Severe depression    Prior to Admission medications   Medication Sig Start Date End Date Taking? Authorizing Provider  clonazePAM (KLONOPIN) 0.5 MG tablet Take 0.5 mg by mouth at bedtime. TAKES 1 OR 2    [provider]  KLOR-CON M10 10 MEQ tablet Take 20 mEq by mouth 3 (three) times daily. 02/23/19   [provider]  losartan-hydrochlorothiazide (HYZAAR) 100-12.5 MG tablet Take 1 tablet by mouth daily.     [provider]  metoprolol succinate (TOPROL-XL) 50 MG 24 hr tablet Take 75 mg by mouth daily. Take with or immediately following a meal.    [provider]  OLANZapine (ZYPREXA) 10 MG tablet Take 10 mg by mouth at bedtime.  [provider]  sertraline (ZOLOFT) 100 MG tablet Take 100 mg by mouth 2 (two) times daily.    [provider]  traZODone (DESYREL) 50 MG tablet Take 100 mg by mouth at bedtime.    [provider]  venlafaxine XR (EFFEXOR-XR) 75 MG 24 hr capsule Take 75 mg by mouth daily with breakfast.    [provider]  verapamil (CALAN-SR) 240 MG CR tablet Take 240 mg by mouth daily. 11/30/17   [provider]  vitamin B-12 (CYANOCOBALAMIN) 100 MCG tablet Take 100 mcg by mouth  daily.    [provider]    Social History   Socioeconomic History   Marital status: Married    Spouse name: Wynelle Fanny   Number of children: 1   Years of education: Not on file   Highest education level: Not on file  Occupational History   Not on file  Tobacco Use   Smoking status: Former    Current packs/day: 0.00    Average packs/day: 1 pack/day for 20.0 years (20.0 ttl pk-yrs)    Types: Cigarettes    Start date: 3    Quit date: 81    Years since quitting: 39.9   Smokeless tobacco: Never   Tobacco comments:    quit 32 years ago  Vaping Use   Vaping status: Never Used  Substance and Sexual Activity   Alcohol use: No   Drug use: No   Sexual activity: Not on file  Other Topics Concern   Not on file  Social History Narrative   Not on file   Social Determinants of Health   Financial Resource Strain: Not on file  Food Insecurity: No Food Insecurity (03/12/2023)   Hunger Vital Sign    Worried About Running Out of Food in the Last Year: Never true    Ran Out of Food in the Last Year: Never true  Transportation Needs: No Transportation Needs (03/12/2023)   PRAPARE - Administrator, Civil Service (Medical): No    Lack of Transportation (Non-Medical): No  Physical Activity: Not on file  Stress: Not on file  Social Connections: Not on file  Intimate Partner Violence: Not At Risk (03/12/2023)   Humiliation, Afraid, Rape, and Kick questionnaire    Fear of Current or Ex-Partner: No    Emotionally Abused: No    Physically Abused: No    Sexually Abused: No     Family History  Problem Relation Age of Onset   Breast cancer Sister 83    ROS: Otherwise negative unless mentioned in HPI  Physical Examination  Vitals:   03/13/23 0500 03/13/23 0600  BP:  135/62  Pulse: 99 98  Resp: 16 15  Temp:    SpO2: 96% 95%   Body mass index is 28.71 kg/m.  General:  WDWN in NAD Gait: Not observed HENT: WNL, normocephalic Pulmonary: normal  non-labored breathing, without Rales, rhonchi,  wheezing Cardiac: regular, without  Murmurs, rubs or gallops; without carotid bruits Abdomen: Positive bowel sounds,  soft, NT/ND, no masses Skin: without rashes Vascular Exam/Pulses: Palpable pulses throughout except left lower extremity DP and PT due to +2 edema. Extremities: without ischemic changes, without Gangrene , without cellulitis; without open wounds; positive +2 edema to left lower extremity. Musculoskeletal: no muscle wasting or atrophy  Neurologic: A&O X 3;  No focal weakness or paresthesias are detected; speech is fluent/normal Psychiatric:  The pt has Normal affect. Lymph:  Unremarkable  CBC    Component Value Date/Time  WBC 11.1 (H) 03/13/2023 0324   RBC 3.38 (L) 03/13/2023 0324   HGB 10.6 (L) 03/13/2023 0324   HCT 32.1 (L) 03/13/2023 0324   PLT 169 03/13/2023 0324   MCV 95.0 03/13/2023 0324   MCH 31.4 03/13/2023 0324   MCHC 33.0 03/13/2023 0324   RDW 13.2 03/13/2023 0324   LYMPHSABS 0.9 03/12/2023 1444   MONOABS 0.8 03/12/2023 1444   EOSABS 0.0 03/12/2023 1444   BASOSABS 0.1 03/12/2023 1444    BMET    Component Value Date/Time   NA 135 03/13/2023 0324   K 3.3 (L) 03/13/2023 0324   CL 106 03/13/2023 0324   CO2 21 (L) 03/13/2023 0324   GLUCOSE 97 03/13/2023 0324   BUN 43 (H) 03/13/2023 0324   CREATININE 1.80 (H) 03/13/2023 0324   CALCIUM 8.4 (L) 03/13/2023 0324   GFRNONAA 29 (L) 03/13/2023 0324   GFRAA >60 04/16/2019 1042    COAGS: Lab Results  Component Value Date   INR 1.2 03/12/2023   INR 1.0 03/23/2020   INR 0.9 04/16/2019     Non-Invasive Vascular Imaging:   EXAM:03/12/23 CT ANGIOGRAPHY CHEST WITH CONTRAST   TECHNIQUE: Multidetector CT imaging of the chest was performed using the standard protocol during bolus administration of intravenous contrast. Multiplanar CT image reconstructions and MIPs were obtained to evaluate the vascular anatomy.   RADIATION DOSE REDUCTION: This exam was  performed according to the departmental dose-optimization program which includes automated exposure control, adjustment of the mA and/or kV according to patient size and/or use of iterative reconstruction technique.   CONTRAST:  75mL OMNIPAQUE IOHEXOL 350 MG/ML SOLN   COMPARISON:  None Available.   FINDINGS: Cardiovascular: There are pulmonary emboli in the distal right main pulmonary artery extending into segmental branches of the right middle lobe, right lower lobe and right upper lobe. There also smaller pulmonary emboli within segmental branches of the left upper lobe and left lower lobe. Heart and aorta are normal in size. There is no pericardial effusion. There are atherosclerotic calcifications of the aorta and coronary arteries.   Mediastinum/Nodes: No enlarged mediastinal, hilar, or axillary lymph nodes. Thyroid gland, trachea, and esophagus demonstrate no significant findings. There is a small hiatal hernia.   Lungs/Pleura: There is a 2 mm ground-glass nodule in the right upper lobe image 6/15. The lungs are otherwise clear. There is no pleural effusion or pneumothorax.   Upper Abdomen: No acute abnormality.   Musculoskeletal: Cervical spinal fusion hardware is present. L1 vertebroplasty changes are present.   Review of the MIP images confirms the above findings.   IMPRESSION: 1. Bilateral pulmonary emboli, right greater than left. No evidence for right heart strain. 2. 2 mm ground-glass nodule in the right upper lobe. No follow-up needed if patient is low-risk.This recommendation follows the consensus statement: Guidelines for Management of Incidental Pulmonary Nodules Detected on CT Images: From the Fleischner Society 2017; Radiology 2017; 284:228-243. 3. Small hiatal hernia. 4. Aortic atherosclerosis.  EXAM:03/12/23 LEFT LOWER EXTREMITY VENOUS DOPPLER ULTRASOUND   TECHNIQUE: Gray-scale sonography with graded compression, as well as color Doppler and  duplex ultrasound were performed to evaluate the lower extremity deep venous systems from the level of the common femoral vein and including the common femoral, femoral, profunda femoral, popliteal and calf veins including the posterior tibial, peroneal and gastrocnemius veins when visible. The superficial great saphenous vein was also interrogated. Spectral Doppler was utilized to evaluate flow at rest and with distal augmentation maneuvers in the common femoral, femoral and popliteal veins.  COMPARISON:  None Available.   FINDINGS: Contralateral Common Femoral Vein: Respiratory phasicity is normal and symmetric with the symptomatic side. No evidence of thrombus. Normal compressibility.   There is hypoechoic occlusive thrombus involving the left common femoral vein (images 2 and 3), extending to involve the saphenofemoral junction as well as the proximal aspect of the left greater saphenous vein (image 6).   There is hypoechoic occlusive thrombus involving the imaged portions of the left deep femoral vein (image 8 and 10).   There is hypoechoic occlusive thrombus involving the proximal (image 8 and 12), mid (images 13 and 14) and distal (images 16 and 17) aspects of the left femoral vein   There is hypoechoic occlusive thrombus involving the left popliteal vein (images 20 and 21, extending to involve both paired divisions of the left posterior tibial vein (image 29 and 30 as well as both paired divisions of the left peroneal vein (images 31 and 32).   IMPRESSION: The examination is positive for extensive occlusive DVT extending from the left common femoral vein through the imaged tibial veins.    Statin:  No. Beta Blocker:  Yes.   Aspirin:  No. ACEI:  No. ARB:  Yes.   CCB use:  Yes Other antiplatelets/anticoagulants:  No.    ASSESSMENT/PLAN: This is a 72 y.o. female who presented to [facility] emergency room with left lower extremity swelling. Upon workup in the ED  patient underwent CTA chest and found to have bilateral pulmonary emboli. Patient also underwent Ultrasound of the left lower extremity and is noted to have an extensive occlusive DVT extending from the left common femoral vein through the tibial veins.   Vascular surgery plans on taking the patient to the vascular lab today for a pulmonary thrombectomy with inferior vena cava filter placement.  I had a detailed discussion with the patient this morning regarding the procedure, benefits, risk, and complications.  She would like to proceed as soon as possible.  I answered all the patient's questions this morning.  Patient has been n.p.o. since midnight last night.   -I discussed the plan in detail with Dr. Festus Barren MD and he agrees with the plan.   Marcie Bal [facility] 03/13/2023 7:25 AM

## 2023-03-13 NOTE — Progress Notes (Signed)
   03/13/23 1600  Spiritual Encounters  Type of Visit Initial  Care provided to: Pt and family  Referral source Chaplain assessment;IDT Rounds  Reason for visit Routine spiritual support  OnCall Visit No  Spiritual Framework  Presenting Themes Meaning/purpose/sources of inspiration;Goals in life/care;Courage hope and growth  Community/Connection Family  Patient Stress Factors Health changes  Family Stress Factors Family relationships  Interventions  Spiritual Care Interventions Made Established relationship of care and support;Compassionate presence;Reflective listening;Encouragement  Intervention Outcomes  Outcomes Connection to spiritual care;Awareness around self/spiritual resourses;Awareness of support  Spiritual Care Plan  Spiritual Care Issues Still Outstanding No further spiritual care needs at this time (see row info)   While rounding chaplain met with patient and her sister. Patient shared with chaplain that she is getting surgery tomorrow and hopefully she will be able to go home for Thanksgiving. Patient's sister shared that they have not seen each other in almost a year. They both told the chaplain that they are going to try and do a better job about getting family together. Patient stated that being in the hospital has been a wake up call for them.

## 2023-03-13 NOTE — Consult Note (Signed)
PHARMACY - ANTICOAGULATION CONSULT NOTE  Pharmacy Consult for Heparin  Indication: VTE Treatment   Allergies  Allergen Reactions   Prednisone Other (See Comments)    Severe depression   Patient Measurements: Height: 5\' 2"  (157.5 cm) Weight: 71.2 kg (156 lb 15.5 oz) IBW/kg (Calculated) : 50.1 Heparin Dosing Weight: 64 kg   Vital Signs: Temp: 97.8 F (36.6 C) (11/25 1958) Temp Source: Oral (11/25 1958) BP: 124/64 (11/25 1958) Pulse Rate: 89 (11/25 1958)  Labs: Recent Labs    03/12/23 1444 03/12/23 1455 03/12/23 1457 03/12/23 1548 03/12/23 1841 03/12/23 2352 03/13/23 0324 03/13/23 1200 03/13/23 2017  HGB 12.6  --   --   --   --   --  10.6*  --   --   HCT 39.5  --   --   --   --   --  32.1*  --   --   PLT 205  --   --   --   --   --  169  --   --   APTT  --   --  23*  --   --   --   --   --   --   LABPROT  --  15.5*  --   --   --   --   --   --   --   INR  --  1.2  --   --   --   --   --   --   --   HEPARINUNFRC  --   --   --   --   --  0.74*  --  0.87* 0.50  CREATININE 2.77*  --   --   --   --   --  1.80*  --   --   TROPONINIHS  --   --   --  5 5  --   --   --   --    Estimated Creatinine Clearance: 25.7 mL/min (A) (by C-G formula based on SCr of 1.8 mg/dL (H)).  Medical History: Past Medical History:  Diagnosis Date   Anxiety    Arthritis    back   Dehydration    Depression    GERD (gastroesophageal reflux disease)    Hypertension    controlled on meds   Hypotension    Pre-diabetes    SIRS (systemic inflammatory response syndrome) (HCC)    Wears partial dentures    upper   Medications:  No PTA anticoagulation   Assessment: Markay Dorschner is a 72 year old female that presented with leg swelling and discoloration. Patient states that she has had some discomfort for about 1 week and noticed it was somewhat discolored today. From chart review, patient is not on any PTA anticoagulation. Imaging to rule out DVT and PE has been ordered. Pharmacy has been  consulted for management of a heparin infusion. Baseline labs: Hgb 12.6, PLT 205, INR 1.2, aPTT pending.   11/24:  HL @ 2352 = 0.74, elevated 11/25:  HL @ 1200 = 0.87, elevated 11/25:  HL @ 2017= 0.50, therapeutic  Goal of Therapy:  Heparin level 0.3-0.7 units/ml Monitor platelets by anticoagulation protocol: Yes   Plan:  - Heparin level therapeutic - Continue heparin infusion at current rate of 950 units/hr - Recheck HL in 8 hours - Continue to monitor CBC daily  Merryl Hacker, PharmD 03/13/2023 9:09 PM

## 2023-03-13 NOTE — Progress Notes (Signed)
PROGRESS NOTE    Carmen [name]   ZOX:[id number] DOB: 1950-08-26  DOA: 03/12/2023 Date of Service: 03/13/23 which is hospital day 1  PCP: Carmen Arbour, MD    HPI: Carmen [name] is a 72 y.o. female with medical history significant for HTN, HLD, CKD3a, Cdiff colitis, prediabetes, depression/anxiety comes to the emergency room today for left leg swelling.  She woke up day prior and had pain in her left leg, progressively worse pain/swelling, eventually not able to stand or bear weight and EMS was called   Hospital course / significant events:  11/24: to ED, hypotensive in shock with systolic of 63/38 which improved (question measurement error?) in about 20 mins to 106/56, mild leukocytosis WBC 15, Cr elevated to 2.77 above baseline 1.0. (+)occlusive DVT L common femoral thru tibial veins. CTA (+)bilateral PE, R>L, no RV strain. Started heparin.  11/25: WBC, Cr trending down. Lactate normalized. Underwent pulmonary thrombectomy/ Plan for LLE DVT thrombectomy tomorrow   Consultants:  Vascular surgery   Procedures/Surgeries: 03/13/23 pulmonary thrombectomy      ASSESSMENT & PLAN:   Occlusive DVT L common femoral thru tibial veins.  Bilateral PE, R>L, no RV strain.  SIRS d/t above (brief tachycardia, leukocytosis, no apparent infection) Heparin w/ plan to transition to po anticoagulation at discharge  Vascular surgery plans for thrombectomy   AKI on CKD3a Cr 2.77 on admission 11/24 --> Cr 1.8 today 03/13/23  Monitor BMP Avoid nephrotoxic meds/contrast as able Holding antihypertensives   Sinus tachycardia Likely d/t rebound from missing beta blocker and calcium channel blocker   Leukocytosis Likely reactive Monitor CBC  HTN Holding home losartan-HCT, metoprolol, verapamil  GAD/MDD Continue home sertaline, olanzapine, clonazepam, trazodone  Hypokalemia Replace as needed Monitor BMP    overweight based on BMI: Body mass index is 28.71 kg/m.   Underweight - under 18.5  normal weight - 18.5 to 24.9 overweight - 25 to 29.9 obese - 30 or more   DVT prophylaxis: heparin IV fluids: no continuous IV fluids  Nutrition: regular diet for now NPO midnight Central lines / invasive devices: none  Code Status: FULL CODE ACP documentation reviewed:  none on file in [facility]  TOC needs: none at this time, PT/OT to assess  Barriers to dispo / significant pending items: procedure tomorrow            Subjective / Brief ROS:  Patient reports no concerns Denies CP/SOB. No palpitations Pain controlled.  Denies new weakness.  Tolerating diet.  Reports no concerns w/ urination/defecation.   Family Communication: family at bedside     Objective Findings:  Vitals:   03/13/23 1115 03/13/23 1143 03/13/23 1200 03/13/23 1518  BP: (!) 140/94  131/84 102/74  Pulse: (!) 113  (!) 115 (!) 121  Resp: 17  19   Temp:  97.6 F (36.4 C)    TempSrc:  Oral    SpO2: 94%  96%   Weight:      Height:        Intake/Output Summary (Last 24 hours) at 03/13/2023 1621 Last data filed at 03/13/2023 1101 Gross per 24 hour  Intake 2031.45 ml  Output 250 ml  Net 1781.45 ml   Filed Weights   03/12/23 2130 03/13/23 0300 03/13/23 0919  Weight: 70.6 kg 71.2 kg 71.2 kg    Examination:  Physical Exam Constitutional:      General: She is not in acute distress. Cardiovascular:     Rate and Rhythm: Regular rhythm. Tachycardia present.  Pulmonary:  Effort: Pulmonary effort is normal.     Breath sounds: Normal breath sounds.  Musculoskeletal:     Right lower leg: No edema.     Left lower leg: Edema (pulses intact) present.  Neurological:     General: No focal deficit present.     Mental Status: She is alert and oriented to person, place, and time.  Psychiatric:        Mood and Affect: Mood normal.        Behavior: Behavior normal.          Scheduled Medications:   Chlorhexidine Gluconate Cloth  6 each Topical Daily    clonazePAM  0.5 mg Oral QHS   metoprolol succinate  50 mg Oral Daily   OLANZapine  10 mg Oral QHS   pantoprazole (PROTONIX) IV  40 mg Intravenous Q12H   sertraline  200 mg Oral Daily   sodium chloride flush  3 mL Intravenous Q12H   traZODone  100 mg Oral QHS   [START ON 03/14/2023] venlafaxine XR  75 mg Oral Q breakfast   [START ON 03/14/2023] verapamil  180 mg Oral Daily    Continuous Infusions:  heparin 950 Units/hr (03/13/23 1406)   lactated ringers 75 mL/hr at 03/13/23 1519    PRN Medications:  acetaminophen **OR** acetaminophen, HYDROcodone-acetaminophen, morphine injection, mouth rinse  Antimicrobials from admission:  Anti-infectives (From admission, onward)    Start     Dose/Rate Route Frequency Ordered Stop   03/13/23 0836  ceFAZolin (ANCEF) IVPB 2g/100 mL premix        2 g 200 mL/hr over 30 Minutes Intravenous 30 min pre-op 03/13/23 0836 03/13/23 1020           Data Reviewed:  I have personally reviewed the following...  CBC: Recent Labs  Lab 03/12/23 1444 03/13/23 0324  WBC 15.0* 11.1*  NEUTROABS 13.2*  --   HGB 12.6 10.6*  HCT 39.5 32.1*  MCV 97.8 95.0  PLT 205 169   Basic Metabolic Panel: Recent Labs  Lab 03/12/23 1444 03/13/23 0324  NA 132* 135  K 4.7 3.3*  CL 100 106  CO2 17* 21*  GLUCOSE 138* 97  BUN 47* 43*  CREATININE 2.77* 1.80*  CALCIUM 9.3 8.4*   GFR: Estimated Creatinine Clearance: 25.7 mL/min (A) (by C-G formula based on SCr of 1.8 mg/dL (H)). Liver Function Tests: Recent Labs  Lab 03/12/23 1444 03/13/23 0324  AST 20 12*  ALT 13 11  ALKPHOS 71 61  BILITOT 0.9 0.5  PROT 8.1 6.7  ALBUMIN 4.3 3.5   No results for input(s): "LIPASE", "AMYLASE" in the last 168 hours. No results for input(s): "AMMONIA" in the last 168 hours. Coagulation Profile: Recent Labs  Lab 03/12/23 1455  INR 1.2   Cardiac Enzymes: No results for input(s): "CKTOTAL", "CKMB", "CKMBINDEX", "TROPONINI" in the last 168 hours. BNP (last 3  results) No results for input(s): "PROBNP" in the last 8760 hours. HbA1C: No results for input(s): "HGBA1C" in the last 72 hours. CBG: Recent Labs  Lab 03/12/23 2121  GLUCAP 115*   Lipid Profile: No results for input(s): "CHOL", "HDL", "LDLCALC", "TRIG", "CHOLHDL", "LDLDIRECT" in the last 72 hours. Thyroid Function Tests: No results for input(s): "TSH", "T4TOTAL", "FREET4", "T3FREE", "THYROIDAB" in the last 72 hours. Anemia Panel: No results for input(s): "VITAMINB12", "FOLATE", "FERRITIN", "TIBC", "IRON", "RETICCTPCT" in the last 72 hours. Most Recent Urinalysis On File:     Component Value Date/Time   COLORURINE STRAW (A) 02/23/2023 1231   APPEARANCEUR HAZY (  A) 02/23/2023 1231   LABSPEC 1.003 (L) 02/23/2023 1231   PHURINE 5.0 02/23/2023 1231   GLUCOSEU NEGATIVE 02/23/2023 1231   HGBUR NEGATIVE 02/23/2023 1231   BILIRUBINUR NEGATIVE 02/23/2023 1231   KETONESUR NEGATIVE 02/23/2023 1231   PROTEINUR NEGATIVE 02/23/2023 1231   NITRITE NEGATIVE 02/23/2023 1231   LEUKOCYTESUR TRACE (A) 02/23/2023 1231   Sepsis Labs: @LABRCNTIP (procalcitonin:4,lacticidven:4) Microbiology: Recent Results (from the past 240 hour(s))  MRSA Next Gen by PCR, Nasal     Status: None   Collection Time: 03/12/23  9:34 PM   Specimen: Nasal Mucosa; Nasal Swab  Result Value Ref Range Status   MRSA by PCR Next Gen NOT DETECTED NOT DETECTED Final    Comment: (NOTE) The GeneXpert MRSA Assay (FDA approved for NASAL specimens only), is one component of a comprehensive MRSA colonization surveillance program. It is not intended to diagnose MRSA infection nor to guide or monitor treatment for MRSA infections. Test performance is not FDA approved in patients less than 88 years old. Performed at [facility], [address] [place], [place] [address]       Radiology Studies last 3 days: PERIPHERAL VASCULAR CATHETERIZATION  Result Date: 03/13/2023 See surgical note for result.  CT Angio Chest  PE W and/or Wo Contrast  Addendum Date: 03/12/2023   ADDENDUM REPORT: 03/12/2023 20:46 ADDENDUM: These results were called by telephone at the time of interpretation on 03/12/2023 at 6:44 pm to provider Dr. Vicente Males, who verbally acknowledged these results. Electronically Signed   By: Darliss Cheney M.D.   On: 03/12/2023 20:46   Result Date: 03/12/2023 CLINICAL DATA:  High probability for PE. EXAM: CT ANGIOGRAPHY CHEST WITH CONTRAST TECHNIQUE: Multidetector CT imaging of the chest was performed using the standard protocol during bolus administration of intravenous contrast. Multiplanar CT image reconstructions and MIPs were obtained to evaluate the vascular anatomy. RADIATION DOSE REDUCTION: This exam was performed according to the departmental dose-optimization program which includes automated exposure control, adjustment of the mA and/or kV according to patient size and/or use of iterative reconstruction technique. CONTRAST:  75mL OMNIPAQUE IOHEXOL 350 MG/ML SOLN COMPARISON:  None Available. FINDINGS: Cardiovascular: There are pulmonary emboli in the distal right main pulmonary artery extending into segmental branches of the right middle lobe, right lower lobe and right upper lobe. There also smaller pulmonary emboli within segmental branches of the left upper lobe and left lower lobe. Heart and aorta are normal in size. There is no pericardial effusion. There are atherosclerotic calcifications of the aorta and coronary arteries. Mediastinum/Nodes: No enlarged mediastinal, hilar, or axillary lymph nodes. Thyroid gland, trachea, and esophagus demonstrate no significant findings. There is a small hiatal hernia. Lungs/Pleura: There is a 2 mm ground-glass nodule in the right upper lobe image 6/15. The lungs are otherwise clear. There is no pleural effusion or pneumothorax. Upper Abdomen: No acute abnormality. Musculoskeletal: Cervical spinal fusion hardware is present. L1 vertebroplasty changes are present. Review  of the MIP images confirms the above findings. IMPRESSION: 1. Bilateral pulmonary emboli, right greater than left. No evidence for right heart strain. 2. 2 mm ground-glass nodule in the right upper lobe. No follow-up needed if patient is low-risk.This recommendation follows the consensus statement: Guidelines for Management of Incidental Pulmonary Nodules Detected on CT Images: From the Fleischner Society 2017; Radiology 2017; 284:228-243. 3. Small hiatal hernia. 4. Aortic atherosclerosis. Aortic Atherosclerosis (ICD10-I70.0). Electronically Signed: By: Darliss Cheney M.D. On: 03/12/2023 18:40   US Venous Img Lower Unilateral Left  Result Date: 03/12/2023 CLINICAL DATA:  Left lower extremity swelling and discoloration. Evaluate for DVT. EXAM: LEFT LOWER EXTREMITY VENOUS DOPPLER ULTRASOUND TECHNIQUE: Gray-scale sonography with graded compression, as well as color Doppler and duplex ultrasound were performed to evaluate the lower extremity deep venous systems from the level of the common femoral vein and including the common femoral, femoral, profunda femoral, popliteal and calf veins including the posterior tibial, peroneal and gastrocnemius veins when visible. The superficial great saphenous vein was also interrogated. Spectral Doppler was utilized to evaluate flow at rest and with distal augmentation maneuvers in the common femoral, femoral and popliteal veins. COMPARISON:  None Available. FINDINGS: Contralateral Common Femoral Vein: Respiratory phasicity is normal and symmetric with the symptomatic side. No evidence of thrombus. Normal compressibility. There is hypoechoic occlusive thrombus involving the left common femoral vein (images 2 and 3), extending to involve the saphenofemoral junction as well as the proximal aspect of the left greater saphenous vein (image 6). There is hypoechoic occlusive thrombus involving the imaged portions of the left deep femoral vein (image 8 and 10). There is hypoechoic  occlusive thrombus involving the proximal (image 8 and 12), mid (images 13 and 14) and distal (images 16 and 17) aspects of the left femoral vein There is hypoechoic occlusive thrombus involving the left popliteal vein (images 20 and 21, extending to involve both paired divisions of the left posterior tibial vein (image 29 and 30 as well as both paired divisions of the left peroneal vein (images 31 and 32). IMPRESSION: The examination is positive for extensive occlusive DVT extending from the left common femoral vein through the imaged tibial veins. Electronically Signed   By: Simonne Come M.D.   On: 03/12/2023 15:54          Sunnie Nielsen, DO [facility] 03/13/2023, 4:21 PM    Dictation software may have been used to generate the above note. Typos may occur and escape review in typed/dictated notes. Please contact Dr Lyn Hollingshead directly for clarity if needed.  Staff may message me via secure chat in Epic  but this may not receive an immediate response,  please page me for urgent matters!  If 7PM-7AM, please contact night coverage [email or web address]

## 2023-03-14 ENCOUNTER — Encounter
Admission: EM | Disposition: A | Discharge status: Home / Self Care | Payer: Self-pay | Source: Home / Self Care | Attending: Osteopathic Medicine

## 2023-03-14 ENCOUNTER — Inpatient Hospital Stay
Admit: 2023-03-14 | Discharge: 2023-03-14 | Disposition: A | Discharge status: Home / Self Care | Payer: Medicare Other | Attending: Vascular Surgery | Admitting: Vascular Surgery

## 2023-03-14 ENCOUNTER — Other Ambulatory Visit (HOSPITAL_COMMUNITY): Payer: Self-pay

## 2023-03-14 ENCOUNTER — Encounter: Payer: Self-pay | Admitting: Vascular Surgery

## 2023-03-14 DIAGNOSIS — M79605 Pain in left leg: Secondary | ICD-10-CM | POA: Diagnosis not present

## 2023-03-14 HISTORY — PX: LOWER EXTREMITY INTERVENTION: CATH118252

## 2023-03-14 LAB — CBC
HCT: 27.4 % — ABNORMAL LOW (ref 36.0–46.0)
Hemoglobin: 9.1 g/dL — ABNORMAL LOW (ref 12.0–15.0)
MCH: 30.5 pg (ref 26.0–34.0)
MCHC: 33.2 g/dL (ref 30.0–36.0)
MCV: 91.9 fL (ref 80.0–100.0)
Platelets: 170 10*3/uL (ref 150–400)
RBC: 2.98 MIL/uL — ABNORMAL LOW (ref 3.87–5.11)
RDW: 13 % (ref 11.5–15.5)
WBC: 9.4 10*3/uL (ref 4.0–10.5)
nRBC: 0 % (ref 0.0–0.2)

## 2023-03-14 LAB — ECHOCARDIOGRAM COMPLETE BUBBLE STUDY
AR max vel: 2.78 cm2
AV Area VTI: 2.82 cm2
AV Area mean vel: 2.72 cm2
AV Mean grad: 3 mm[Hg]
AV Peak grad: 6 mm[Hg]
Ao pk vel: 1.22 m/s
Area-P 1/2: 3.17 cm2
MV VTI: 2.19 cm2
S' Lateral: 3.4 cm

## 2023-03-14 LAB — HEPARIN LEVEL (UNFRACTIONATED)
Heparin Unfractionated: <0.1 [IU]/mL — ABNORMAL LOW (ref 0.30–0.70)
Heparin Unfractionated: 0.33 [IU]/mL (ref 0.30–0.70)

## 2023-03-14 LAB — BASIC METABOLIC PANEL
Anion gap: 8 (ref 5–15)
BUN: 34 mg/dL — ABNORMAL HIGH (ref 8–23)
CO2: 22 mmol/L (ref 22–32)
Calcium: 8.6 mg/dL — ABNORMAL LOW (ref 8.9–10.3)
Chloride: 106 mmol/L (ref 98–111)
Creatinine, Ser: 1.17 mg/dL — ABNORMAL HIGH (ref 0.44–1.00)
GFR, Estimated: 49 mL/min — ABNORMAL LOW (ref >60)
Glucose, Bld: 108 mg/dL — ABNORMAL HIGH (ref 70–99)
Potassium: 3.4 mmol/L — ABNORMAL LOW (ref 3.5–5.1)
Sodium: 136 mmol/L (ref 135–145)

## 2023-03-14 SURGERY — LOWER EXTREMITY INTERVENTION
Anesthesia: Moderate Sedation | Laterality: Left

## 2023-03-14 MED ORDER — FENTANYL CITRATE (PF) 100 MCG/2ML IJ SOLN
INTRAMUSCULAR | Status: AC
  Filled 2023-03-14: qty 2

## 2023-03-14 MED ORDER — FENTANYL CITRATE (PF) 100 MCG/2ML IJ SOLN
INTRAMUSCULAR | Status: DC | PRN
  Administered 2023-03-14 (×2): 25 ug via INTRAVENOUS
  Administered 2023-03-14: 50 ug via INTRAVENOUS

## 2023-03-14 MED ORDER — HEPARIN SODIUM (PORCINE) 1000 UNIT/ML IJ SOLN
INTRAMUSCULAR | Status: DC | PRN
  Administered 2023-03-14: 3000 [IU] via INTRAVENOUS

## 2023-03-14 MED ORDER — POTASSIUM CHLORIDE CRYS ER 20 MEQ PO TBCR
20.0000 meq | EXTENDED_RELEASE_TABLET | Freq: Once | ORAL | Status: AC
  Administered 2023-03-14: 20 meq via ORAL
  Filled 2023-03-14: qty 1

## 2023-03-14 MED ORDER — HEPARIN SODIUM (PORCINE) 1000 UNIT/ML IJ SOLN
INTRAMUSCULAR | Status: AC
  Filled 2023-03-14: qty 10

## 2023-03-14 MED ORDER — HEPARIN SODIUM (PORCINE) 1000 UNIT/ML IJ SOLN
INTRAMUSCULAR | Status: AC
Start: 2023-03-14 — End: ?
  Filled 2023-03-14: qty 10

## 2023-03-14 MED ORDER — ALTEPLASE 1 MG/ML SYRINGE FOR VASCULAR PROCEDURE
INTRAMUSCULAR | Status: DC | PRN
  Administered 2023-03-14: 6 mg via INTRA_ARTERIAL

## 2023-03-14 MED ORDER — MIDAZOLAM HCL 2 MG/2ML IJ SOLN
INTRAMUSCULAR | Status: AC
Start: 2023-03-14 — End: ?
  Filled 2023-03-14: qty 2

## 2023-03-14 MED ORDER — ACETAMINOPHEN 650 MG RE SUPP: 650.0000 mg | Freq: Four times a day (QID) | RECTAL | Status: DC | PRN

## 2023-03-14 MED ORDER — LIDOCAINE-EPINEPHRINE (PF) 1 %-1:200000 IJ SOLN
INTRAMUSCULAR | Status: DC | PRN
  Administered 2023-03-14: 10 mL

## 2023-03-14 MED ORDER — IODIXANOL 320 MG/ML IV SOLN
INTRAVENOUS | Status: DC | PRN
  Administered 2023-03-14: 40 mL via INTRAVENOUS

## 2023-03-14 MED ORDER — CEFAZOLIN SODIUM-DEXTROSE 2-4 GM/100ML-% IV SOLN
INTRAVENOUS | Status: AC
  Filled 2023-03-14: qty 100

## 2023-03-14 MED ORDER — MIDAZOLAM HCL 2 MG/2ML IJ SOLN
INTRAMUSCULAR | Status: DC | PRN
  Administered 2023-03-14 (×2): .5 mg via INTRAVENOUS
  Administered 2023-03-14: 1 mg via INTRAVENOUS

## 2023-03-14 MED ORDER — ACETAMINOPHEN 325 MG PO TABS: 650.0000 mg | ORAL_TABLET | Freq: Four times a day (QID) | ORAL | Status: DC | PRN

## 2023-03-14 MED ORDER — HEPARIN (PORCINE) IN NACL 2000-0.9 UNIT/L-% IV SOLN
INTRAVENOUS | Status: DC | PRN
  Administered 2023-03-14: 1000 mL

## 2023-03-14 MED ORDER — MIDAZOLAM HCL 5 MG/5ML IJ SOLN
INTRAMUSCULAR | Status: AC
  Filled 2023-03-14: qty 5

## 2023-03-14 SURGICAL SUPPLY — 18 items
BALLN ATG 12X6X80 (BALLOONS) ×1
BALLN DORADO 8X100X80 (BALLOONS) ×1
BALLOON ATG 12X6X80 (BALLOONS) IMPLANT
BALLOON DORADO 8X100X80 (BALLOONS) IMPLANT
CANISTER PENUMBRA ENGINE (MISCELLANEOUS) IMPLANT
CANNULA 5F STIFF (CANNULA) IMPLANT
CATH LIGHTNI FLASH 16XTORQ 100 (CATHETERS) IMPLANT
CATH LITNG FLASH HTORQ 100BERN (CATHETERS) ×1
CLOSURE PERCLOSE PROSTYLE (VASCULAR PRODUCTS) IMPLANT
COVER PROBE ULTRASOUND 5X96 (MISCELLANEOUS) IMPLANT
DEVICE PRESTO INFLATION (MISCELLANEOUS) IMPLANT
GLIDEWIRE ADV .035X180CM (WIRE) IMPLANT
PACK ANGIOGRAPHY (CUSTOM PROCEDURE TRAY) IMPLANT
SHEATH BRITE TIP 6FRX11 (SHEATH) IMPLANT
SHEATH INTRO CHECKFLO 16F 13 (SHEATH) IMPLANT
SUT MNCRL AB 4-0 PS2 18 (SUTURE) IMPLANT
SUT PROLENE 0 CT 1 30 (SUTURE) IMPLANT
WIRE GUIDERIGHT .035X150 (WIRE) IMPLANT

## 2023-03-14 NOTE — Op Note (Signed)
[facility]   OPERATIVE NOTE   PRE-OPERATIVE DIAGNOSIS: extensive LLE DVT  POST-OPERATIVE DIAGNOSIS: same   PROCEDURE: 1.   US guidance for vascular access to left popliteal vein 2.   Catheter placement into left iliac vein from left popliteal approach 3.   IVC gram and left lower extremity venogram 4.   Catheter directed thrombolysis with 6 mg of tPA to the left popliteal and femoral veins 5.   Mechanical thrombectomy to left femoral vein, common femoral vein, external and common iliac veins, and inferior vena cava at the penumbra 16 device 6.   PTA of the left common iliac vein with 8 and 12 mm balloon    SURGEON: Festus Barren, MD  ASSISTANT(S): none  ANESTHESIA: local with moderate conscious sedation for 37 minutes using 2 mg of Versed and 100 mcg of Fentanyl  ESTIMATED BLOOD LOSS: 600 cc  FINDING(S): 1.  Extensive thrombosis from the left mid to distal femoral vein up to the common femoral vein and the iliac veins extending into the IVC.  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  SPECIMEN(S):  none  INDICATIONS:    Patient is a 72 y.o. female who presents with extensive left lower extremity DVT and pulmonary embolus.  She has already undergone pulmonary thrombectomy and IVC filter placement.  Patient has marked leg swelling and pain.  Venous intervention is performed to reduce the symtpoms and avoid long term postphlebitic symptoms.    DESCRIPTION: After obtaining full informed written consent, the patient was brought back to the vascular suite and placed supine upon the table. Moderate conscious sedation was administered during a face to face encounter with the patient throughout the procedure with my supervision of the RN administering medicines and monitoring the patient's vital signs, pulse oximetry, telemetry and mental status throughout from the start of the procedure until  the patient was taken to the recovery room.  After obtaining adequate anesthesia, the patient was prepped and draped in the standard fashion.    The patient was then placed into the prone position.  The left popliteal vein was then accessed under direct ultrasound guidance without difficulty with a micropuncture needle and a permanent image was recorded.  I then upsized to a 6 [place] sheath over a J-wire and placed a ProGlide device in a preclose fashion.  I then upsized to an 16 Fr sheath over a J wire.  3000 units of heparin were then given.  Imaging showed extensive DVT with minimal flow.  A Kumpe catheter and Magic tourque wire were then advanced into the CFV left external iliac vein and images were performed.  There appeared to be a near occlusive stenosis in the left common iliac vein with thrombus throughout the iliac veins extending into the IVC but not all the way into the IVC filter.  I then used the penumbra 16 French catheter and instilled 6 mg of tpa throughout the femoral veins.  After this dwelled, I used the Penumbra Cat 16 catheter and evacuated about 300 just 400 cc of effluent with mechanical thrombectomy throughout the iliac veins, CFV, and femoral vein.  This had marked improvement.  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  The stenosis/occlusion and thrombus was treated with a 8 mm diameter angioplasty balloon initially.  I then upsized to a  12 mm diameter by 6 cm length angioplasty balloon and inflated this twice in the left common iliac vein and IVC going up to 10 atm.  Completion imaging showed marked improvement in the vein with less than 20% residual stenosis but there was still thrombus in the area.  1 more pass with the penumbra 16 catheter was done for mechanical thrombectomy in the area with marked improvement.  I then elected to terminate the procedure.  The sheath was  removed, Pro-glide device was secured, and a dressing was placed.  She was taken to the recovery room in stable condition having tolerated the procedure well.    COMPLICATIONS: None  CONDITION: Stable  Festus Barren 03/14/2023 5:26 PM

## 2023-03-14 NOTE — Interval H&P Note (Signed)
History and Physical Interval Note:  03/14/2023 4:13 PM  Carmen [name]  has presented today for surgery, with the diagnosis of Deep Vein Thrombosis Left Lower.  The various methods of treatment have been discussed with the patient and family. After consideration of risks, benefits and other options for treatment, the patient has consented to  Procedure(s) with comments: LOWER EXTREMITY INTERVENTION (Left) - Lower Extremity Thrombectomy. Schedule after Dr [facility] 4 pm or after. as a surgical intervention.  The patient's history has been reviewed, patient examined, no change in status, stable for surgery.  I have reviewed the patient's chart and labs.  Questions were answered to the patient's satisfaction.     Festus Barren

## 2023-03-14 NOTE — Progress Notes (Signed)
*  PRELIMINARY RESULTS* Echocardiogram 2D Echocardiogram has been performed.  Cristela Blue 03/14/2023, 2:50 PM

## 2023-03-14 NOTE — Progress Notes (Signed)
PROGRESS NOTE    Carmen [name]   ZOX:[id number] DOB: 24-Sep-1950  DOA: 03/12/2023 Date of Service: 03/14/23 which is hospital day 2  PCP: Marguarite Arbour, MD    HPI: Carmen [name] is a 72 y.o. female with medical history significant for HTN, HLD, CKD3a, Cdiff colitis, prediabetes, depression/anxiety comes to the emergency room today for left leg swelling.  She woke up day prior and had pain in her left leg, progressively worse pain/swelling, eventually not able to stand or bear weight and EMS was called   Hospital course / significant events:  11/24: to ED, hypotensive in shock with systolic of 63/38 which improved (question measurement error?) in about 20 mins to 106/56, mild leukocytosis WBC 15, Cr elevated to 2.77 above baseline 1.0. (+)occlusive DVT L common femoral thru tibial veins. CTA (+)bilateral PE, R>L, no RV strain. Started heparin.  11/25: WBC, Cr trending down. Lactate normalized. Underwent pulmonary thrombectomy. Plan for LLE DVT thrombectomy tomorrow  11/26: Cr continues to improve. Plan for DVT thrombectomy today.   Consultants:  Vascular surgery   Procedures/Surgeries: 03/13/23 pulmonary thrombectomy 03/14/23 [PENDING LLE thrombectomy]      ASSESSMENT & PLAN:   Occlusive DVT L common femoral thru tibial veins.  Bilateral PE, R>L, no RV strain.  SIRS d/t above (brief tachycardia, leukocytosis, no apparent infection) S/p pulmonary thrombectomy 11/25 Heparin w/ plan to transition to po anticoagulation at discharge  Vascular surgery following: plans for thrombectomy LLE today  AKI on CKD3a Cr 2.77 on admission 11/24 --> Cr 1.17 today 03/14/23  Monitor BMP Avoid nephrotoxic meds/contrast as able  Sinus tachycardia - resolved Likely d/t rebound from missing beta blocker and calcium channel blocker, resolved once beta blocker resumed   Leukocytosis - resolved  Likely reactive Monitor CBC  HTN Holding home losartan-HCT Restarted  metoprolol Restarted verapamil w/ holding parameters   GAD/MDD Continue home sertaline,venlafaxine, olanzapine, trazodone - confirmed w/ pt she is on SSRI + SNRI  Hypokalemia Replace as needed Monitor BMP    overweight based on BMI: Body mass index is 28.71 kg/m.  Underweight - under 18.5  normal weight - 18.5 to 24.9 overweight - 25 to 29.9 obese - 30 or more   DVT prophylaxis: heparin IV fluids: no continuous IV fluids  Nutrition: regular diet for now NPO midnight Central lines / invasive devices: none  Code Status: FULL CODE ACP documentation reviewed:  none on file in [facility]  TOC needs: none at this time, PT/OT to assess  Barriers to dispo / significant pending items: procedure tomorrow          Subjective / Brief ROS:  Patient reports no concerns Denies CP/SOB. No palpitations Pain controlled.  Denies new weakness.  Tolerating diet.  Reports no concerns w/ urination/defecation.   Family Communication: family at bedside     Objective Findings:  Vitals:   03/13/23 1958 03/14/23 0011 03/14/23 0453 03/14/23 0836  BP: 124/64 (!) 121/48 135/60 (!) 147/76  Pulse: 89 77 78 82  Resp: 15 18 18 18   Temp: 97.8 F (36.6 C) (!) 97.5 F (36.4 C) (!) 97.5 F (36.4 C) (!) 97.5 F (36.4 C)  TempSrc: Oral Oral Oral Oral  SpO2: 96% 95% 96% 99%  Weight:      Height:        Intake/Output Summary (Last 24 hours) at 03/14/2023 1537 Last data filed at 03/14/2023 1206 Gross per 24 hour  Intake 1925.49 ml  Output 700 ml  Net 1225.49 ml   Ceasar Mons  Weights   03/12/23 2130 03/13/23 0300 03/13/23 0919  Weight: 70.6 kg 71.2 kg 71.2 kg      Examination:   Physical Exam Constitutional:      General: She is not in acute distress. Cardiovascular:     Rate and Rhythm: Normal rate and regular rhythm.  Pulmonary:     Effort: Pulmonary effort is normal.     Breath sounds: Normal breath sounds.  Musculoskeletal:     Right lower leg: No edema.     Left lower leg:  Edema (pulses intact) present.  Neurological:     General: No focal deficit present.     Mental Status: She is alert and oriented to person, place, and time.  Psychiatric:        Mood and Affect: Mood normal.        Behavior: Behavior normal.          Scheduled Medications:   Chlorhexidine Gluconate Cloth  6 each Topical Daily   clonazePAM  0.5 mg Oral QHS   metoprolol succinate  50 mg Oral Daily   OLANZapine  10 mg Oral QHS   pantoprazole (PROTONIX) IV  40 mg Intravenous Q12H   sertraline  200 mg Oral Daily   sodium chloride flush  3 mL Intravenous Q12H   traZODone  100 mg Oral QHS   venlafaxine XR  75 mg Oral Q breakfast   verapamil  180 mg Oral Daily    Continuous Infusions:  sodium chloride 75 mL/hr at 03/14/23 0400    ceFAZolin (ANCEF) IV     heparin 950 Units/hr (03/14/23 0434)    PRN Medications:  acetaminophen **OR** acetaminophen, diphenhydrAMINE, famotidine, HYDROcodone-acetaminophen, midazolam, morphine injection, ondansetron (ZOFRAN) IV, mouth rinse  Antimicrobials from admission:  Anti-infectives (From admission, onward)    Start     Dose/Rate Route Frequency Ordered Stop   03/13/23 1904  ceFAZolin (ANCEF) IVPB 2g/100 mL premix        2 g 200 mL/hr over 30 Minutes Intravenous 30 min pre-op 03/13/23 1904     03/13/23 0836  ceFAZolin (ANCEF) IVPB 2g/100 mL premix        2 g 200 mL/hr over 30 Minutes Intravenous 30 min pre-op 03/13/23 0836 03/13/23 1020           Data Reviewed:  I have personally reviewed the following...  CBC: Recent Labs  Lab 03/12/23 1444 03/13/23 0324 03/14/23 0459  WBC 15.0* 11.1* 9.4  NEUTROABS 13.2*  --   --   HGB 12.6 10.6* 9.1*  HCT 39.5 32.1* 27.4*  MCV 97.8 95.0 91.9  PLT 205 169 170   Basic Metabolic Panel: Recent Labs  Lab 03/12/23 1444 03/13/23 0324 03/14/23 0459  NA 132* 135 136  K 4.7 3.3* 3.4*  CL 100 106 106  CO2 17* 21* 22  GLUCOSE 138* 97 108*  BUN 47* 43* 34*  CREATININE 2.77* 1.80*  1.17*  CALCIUM 9.3 8.4* 8.6*   GFR: Estimated Creatinine Clearance: 39.5 mL/min (A) (by C-G formula based on SCr of 1.17 mg/dL (H)). Liver Function Tests: Recent Labs  Lab 03/12/23 1444 03/13/23 0324  AST 20 12*  ALT 13 11  ALKPHOS 71 61  BILITOT 0.9 0.5  PROT 8.1 6.7  ALBUMIN 4.3 3.5   No results for input(s): "LIPASE", "AMYLASE" in the last 168 hours. No results for input(s): "AMMONIA" in the last 168 hours. Coagulation Profile: Recent Labs  Lab 03/12/23 1455  INR 1.2   Cardiac Enzymes: No results for input(s): "  CKTOTAL", "CKMB", "CKMBINDEX", "TROPONINI" in the last 168 hours. BNP (last 3 results) No results for input(s): "PROBNP" in the last 8760 hours. HbA1C: No results for input(s): "HGBA1C" in the last 72 hours. CBG: Recent Labs  Lab 03/12/23 2121  GLUCAP 115*   Lipid Profile: No results for input(s): "CHOL", "HDL", "LDLCALC", "TRIG", "CHOLHDL", "LDLDIRECT" in the last 72 hours. Thyroid Function Tests: No results for input(s): "TSH", "T4TOTAL", "FREET4", "T3FREE", "THYROIDAB" in the last 72 hours. Anemia Panel: No results for input(s): "VITAMINB12", "FOLATE", "FERRITIN", "TIBC", "IRON", "RETICCTPCT" in the last 72 hours. Most Recent Urinalysis On File:     Component Value Date/Time   COLORURINE STRAW (A) 02/23/2023 1231   APPEARANCEUR HAZY (A) 02/23/2023 1231   LABSPEC 1.003 (L) 02/23/2023 1231   PHURINE 5.0 02/23/2023 1231   GLUCOSEU NEGATIVE 02/23/2023 1231   HGBUR NEGATIVE 02/23/2023 1231   BILIRUBINUR NEGATIVE 02/23/2023 1231   KETONESUR NEGATIVE 02/23/2023 1231   PROTEINUR NEGATIVE 02/23/2023 1231   NITRITE NEGATIVE 02/23/2023 1231   LEUKOCYTESUR TRACE (A) 02/23/2023 1231   Sepsis Labs: @LABRCNTIP (procalcitonin:4,lacticidven:4) Microbiology: Recent Results (from the past 240 hour(s))  MRSA Next Gen by PCR, Nasal     Status: None   Collection Time: 03/12/23  9:34 PM   Specimen: Nasal Mucosa; Nasal Swab  Result Value Ref Range Status   MRSA  by PCR Next Gen NOT DETECTED NOT DETECTED Final    Comment: (NOTE) The GeneXpert MRSA Assay (FDA approved for NASAL specimens only), is one component of a comprehensive MRSA colonization surveillance program. It is not intended to diagnose MRSA infection nor to guide or monitor treatment for MRSA infections. Test performance is not FDA approved in patients less than 30 years old. Performed at [facility], [address], [place], [place] [address]       Radiology Studies last 3 days: PERIPHERAL VASCULAR CATHETERIZATION  Result Date: 03/13/2023 See surgical note for result.  CT Angio Chest PE W and/or Wo Contrast  Addendum Date: 03/12/2023   ADDENDUM REPORT: 03/12/2023 20:46 ADDENDUM: These results were called by telephone at the time of interpretation on 03/12/2023 at 6:44 pm to provider Dr. Vicente Males, who verbally acknowledged these results. Electronically Signed   By: Darliss Cheney M.D.   On: 03/12/2023 20:46   Result Date: 03/12/2023 CLINICAL DATA:  High probability for PE. EXAM: CT ANGIOGRAPHY CHEST WITH CONTRAST TECHNIQUE: Multidetector CT imaging of the chest was performed using the standard protocol during bolus administration of intravenous contrast. Multiplanar CT image reconstructions and MIPs were obtained to evaluate the vascular anatomy. RADIATION DOSE REDUCTION: This exam was performed according to the departmental dose-optimization program which includes automated exposure control, adjustment of the mA and/or kV according to patient size and/or use of iterative reconstruction technique. CONTRAST:  75mL OMNIPAQUE IOHEXOL 350 MG/ML SOLN COMPARISON:  None Available. FINDINGS: Cardiovascular: There are pulmonary emboli in the distal right main pulmonary artery extending into segmental branches of the right middle lobe, right lower lobe and right upper lobe. There also smaller pulmonary emboli within segmental branches of the left upper lobe and left lower lobe. Heart and  aorta are normal in size. There is no pericardial effusion. There are atherosclerotic calcifications of the aorta and coronary arteries. Mediastinum/Nodes: No enlarged mediastinal, hilar, or axillary lymph nodes. Thyroid gland, trachea, and esophagus demonstrate no significant findings. There is a small hiatal hernia. Lungs/Pleura: There is a 2 mm ground-glass nodule in the right upper lobe image 6/15. The lungs are otherwise clear. There is no  pleural effusion or pneumothorax. Upper Abdomen: No acute abnormality. Musculoskeletal: Cervical spinal fusion hardware is present. L1 vertebroplasty changes are present. Review of the MIP images confirms the above findings. IMPRESSION: 1. Bilateral pulmonary emboli, right greater than left. No evidence for right heart strain. 2. 2 mm ground-glass nodule in the right upper lobe. No follow-up needed if patient is low-risk.This recommendation follows the consensus statement: Guidelines for Management of Incidental Pulmonary Nodules Detected on CT Images: From the Fleischner Society 2017; Radiology 2017; 284:228-243. 3. Small hiatal hernia. 4. Aortic atherosclerosis. Aortic Atherosclerosis (ICD10-I70.0). Electronically Signed: By: Darliss Cheney M.D. On: 03/12/2023 18:40   US Venous Img Lower Unilateral Left  Result Date: 03/12/2023 CLINICAL DATA:  Left lower extremity swelling and discoloration. Evaluate for DVT. EXAM: LEFT LOWER EXTREMITY VENOUS DOPPLER ULTRASOUND TECHNIQUE: Gray-scale sonography with graded compression, as well as color Doppler and duplex ultrasound were performed to evaluate the lower extremity deep venous systems from the level of the common femoral vein and including the common femoral, femoral, profunda femoral, popliteal and calf veins including the posterior tibial, peroneal and gastrocnemius veins when visible. The superficial great saphenous vein was also interrogated. Spectral Doppler was utilized to evaluate flow at rest and with distal  augmentation maneuvers in the common femoral, femoral and popliteal veins. COMPARISON:  None Available. FINDINGS: Contralateral Common Femoral Vein: Respiratory phasicity is normal and symmetric with the symptomatic side. No evidence of thrombus. Normal compressibility. There is hypoechoic occlusive thrombus involving the left common femoral vein (images 2 and 3), extending to involve the saphenofemoral junction as well as the proximal aspect of the left greater saphenous vein (image 6). There is hypoechoic occlusive thrombus involving the imaged portions of the left deep femoral vein (image 8 and 10). There is hypoechoic occlusive thrombus involving the proximal (image 8 and 12), mid (images 13 and 14) and distal (images 16 and 17) aspects of the left femoral vein There is hypoechoic occlusive thrombus involving the left popliteal vein (images 20 and 21, extending to involve both paired divisions of the left posterior tibial vein (image 29 and 30 as well as both paired divisions of the left peroneal vein (images 31 and 32). IMPRESSION: The examination is positive for extensive occlusive DVT extending from the left common femoral vein through the imaged tibial veins. Electronically Signed   By: Simonne Come M.D.   On: 03/12/2023 15:54          Sunnie Nielsen, DO [facility] 03/14/2023, 3:37 PM    Dictation software may have been used to generate the above note. Typos may occur and escape review in typed/dictated notes. Please contact Dr Lyn Hollingshead directly for clarity if needed.  Staff may message me via secure chat in Epic  but this may not receive an immediate response,  please page me for urgent matters!  If 7PM-7AM, please contact night coverage [email or web address]

## 2023-03-14 NOTE — TOC Benefit Eligibility Note (Signed)
Pharmacy Patient Advocate Encounter  Insurance verification completed.    The patient is insured through  [employer]    Ran test claim for Eliquis. Currently a quantity of 60 is a 30 day supply and the co-pay is $474.62 .  Ran test claim for Xarelto. Currently a quantity of 30 is a 30 day supply and the co-pay is $469.14 .  Patient has deductible still to meet  This test claim was processed through [facility]- copay amounts may vary at other pharmacies due to pharmacy/plan contracts, or as the patient moves through the different stages of their insurance plan.

## 2023-03-14 NOTE — Progress Notes (Signed)
  Progress Note    03/14/2023 2:41 PM * No surgery date entered *  Subjective:  Carmen [name] is a 72 yo female who is now POD #1 from a pulmonary Thrombectomy with IVC placement.  Patient is recovering as expected.  Patient endorses she feels like she is breathing better.  Patient does endorse soreness to the right groin.  Patient endorses left leg still remains swollen and painful at rest.  Noted DVT from prior studies.  Thrombectomy planned for later today.  No complaints overnight.  Vitals are remained stable.   Vitals:   03/14/23 0453 03/14/23 0836  BP: 135/60 (!) 147/76  Pulse: 78 82  Resp: 18 18  Temp: (!) 97.5 F (36.4 C) (!) 97.5 F (36.4 C)  SpO2: 96% 99%   Physical Exam: Cardiac:  RRR, Normal S1, S2. No murmurs.  Lungs:  normal non-labored breathing, without Rales, rhonchi, wheezing. Diminished in the bases.  Incisions:  Right groin with dressing clean dry and intact.  Extremities:  Palpable pulses throughout except left lower extremity DP and PT due to +2 edema.  Abdomen:  Positive bowel sounds,  soft, NT/ND, no masses  Neurologic: A&O X 3; No focal weakness or paresthesias are detected; speech is fluent/normal   CBC    Component Value Date/Time   WBC 9.4 03/14/2023 0459   RBC 2.98 (L) 03/14/2023 0459   HGB 9.1 (L) 03/14/2023 0459   HCT 27.4 (L) 03/14/2023 0459   PLT 170 03/14/2023 0459   MCV 91.9 03/14/2023 0459   MCH 30.5 03/14/2023 0459   MCHC 33.2 03/14/2023 0459   RDW 13.0 03/14/2023 0459   LYMPHSABS 0.9 03/12/2023 1444   MONOABS 0.8 03/12/2023 1444   EOSABS 0.0 03/12/2023 1444   BASOSABS 0.1 03/12/2023 1444    BMET    Component Value Date/Time   NA 136 03/14/2023 0459   K 3.4 (L) 03/14/2023 0459   CL 106 03/14/2023 0459   CO2 22 03/14/2023 0459   GLUCOSE 108 (H) 03/14/2023 0459   BUN 34 (H) 03/14/2023 0459   CREATININE 1.17 (H) 03/14/2023 0459   CALCIUM 8.6 (L) 03/14/2023 0459   GFRNONAA 49 (L) 03/14/2023 0459   GFRAA >60 04/16/2019 1042     INR    Component Value Date/Time   INR 1.2 03/12/2023 1455     Intake/Output Summary (Last 24 hours) at 03/14/2023 1441 Last data filed at 03/14/2023 1206 Gross per 24 hour  Intake 1925.49 ml  Output 700 ml  Net 1225.49 ml     Assessment/Plan:  72 y.o. female is s/p Pulmonary thrombectomy with IVC placement.  * No surgery date entered *   PLAN: Vascular surgery plans on taking the patient to the vascular lab on 03/14/2023 for a left lower extremity venous thrombectomy.  I discussed with the patient and the family at the bedside the procedure, benefits, risk, and complications.  They verbalized her understanding and wished to proceed.  I answered all the questions today.  Patient has been n.p.o. since midnight.  DVT prophylaxis: Heparin infusion   Marcie Bal [facility] 03/14/2023 2:41 PM

## 2023-03-14 NOTE — Progress Notes (Signed)
Patient is confused as to where she is. What is happening. Why she is here. She has removed both PIV's, Attempted to get out of bed multiple times.  Calm, not aggressive or anxious. New PIV has been placed. I have wrapped it. Patient has been awake during the entire shift.

## 2023-03-14 NOTE — Progress Notes (Signed)
Dr. Wyn Quaker at bedside, speaking with pt. And family (spouse/son) re: procedural results and possible plans for DC home tomorrow. Both family & pt. Verbalized understanding of conversation.

## 2023-03-14 NOTE — H&P (View-Only) (Signed)
  Progress Note    03/14/2023 2:41 PM * No surgery date entered *  Subjective:  Carmen [name] is a 72 yo female who is now POD #1 from a pulmonary Thrombectomy with IVC placement.  Patient is recovering as expected.  Patient endorses she feels like she is breathing better.  Patient does endorse soreness to the right groin.  Patient endorses left leg still remains swollen and painful at rest.  Noted DVT from prior studies.  Thrombectomy planned for later today.  No complaints overnight.  Vitals are remained stable.   Vitals:   03/14/23 0453 03/14/23 0836  BP: 135/60 (!) 147/76  Pulse: 78 82  Resp: 18 18  Temp: (!) 97.5 F (36.4 C) (!) 97.5 F (36.4 C)  SpO2: 96% 99%   Physical Exam: Cardiac:  RRR, Normal S1, S2. No murmurs.  Lungs:  normal non-labored breathing, without Rales, rhonchi, wheezing. Diminished in the bases.  Incisions:  Right groin with dressing clean dry and intact.  Extremities:  Palpable pulses throughout except left lower extremity DP and PT due to +2 edema.  Abdomen:  Positive bowel sounds,  soft, NT/ND, no masses  Neurologic: A&O X 3; No focal weakness or paresthesias are detected; speech is fluent/normal   CBC    Component Value Date/Time   WBC 9.4 03/14/2023 0459   RBC 2.98 (L) 03/14/2023 0459   HGB 9.1 (L) 03/14/2023 0459   HCT 27.4 (L) 03/14/2023 0459   PLT 170 03/14/2023 0459   MCV 91.9 03/14/2023 0459   MCH 30.5 03/14/2023 0459   MCHC 33.2 03/14/2023 0459   RDW 13.0 03/14/2023 0459   LYMPHSABS 0.9 03/12/2023 1444   MONOABS 0.8 03/12/2023 1444   EOSABS 0.0 03/12/2023 1444   BASOSABS 0.1 03/12/2023 1444    BMET    Component Value Date/Time   NA 136 03/14/2023 0459   K 3.4 (L) 03/14/2023 0459   CL 106 03/14/2023 0459   CO2 22 03/14/2023 0459   GLUCOSE 108 (H) 03/14/2023 0459   BUN 34 (H) 03/14/2023 0459   CREATININE 1.17 (H) 03/14/2023 0459   CALCIUM 8.6 (L) 03/14/2023 0459   GFRNONAA 49 (L) 03/14/2023 0459   GFRAA >60 04/16/2019 1042     INR    Component Value Date/Time   INR 1.2 03/12/2023 1455     Intake/Output Summary (Last 24 hours) at 03/14/2023 1441 Last data filed at 03/14/2023 1206 Gross per 24 hour  Intake 1925.49 ml  Output 700 ml  Net 1225.49 ml     Assessment/Plan:  72 y.o. female is s/p Pulmonary thrombectomy with IVC placement.  * No surgery date entered *   PLAN: Vascular surgery plans on taking the patient to the vascular lab on 03/14/2023 for a left lower extremity venous thrombectomy.  I discussed with the patient and the family at the bedside the procedure, benefits, risk, and complications.  They verbalized her understanding and wished to proceed.  I answered all the questions today.  Patient has been n.p.o. since midnight.  DVT prophylaxis: Heparin infusion   Marcie Bal [facility] 03/14/2023 2:41 PM

## 2023-03-14 NOTE — Consult Note (Signed)
PHARMACY - ANTICOAGULATION CONSULT NOTE  Pharmacy Consult for Heparin  Indication: VTE Treatment   Allergies  Allergen Reactions   Prednisone Other (See Comments)    Severe depression   Patient Measurements: Height: 5\' 2"  (157.5 cm) Weight: 71.2 kg (156 lb 15.5 oz) IBW/kg (Calculated) : 50.1 Heparin Dosing Weight: 64 kg   Vital Signs: Temp: 97.5 F (36.4 C) (11/26 0836) Temp Source: Oral (11/26 0836) BP: 147/76 (11/26 0836) Pulse Rate: 82 (11/26 0836)  Labs: Recent Labs    03/12/23 1444 03/12/23 1455 03/12/23 1457 03/12/23 1548 03/12/23 1841 03/12/23 2352 03/13/23 0324 03/13/23 1200 03/13/23 2017 03/14/23 0459 03/14/23 1053  HGB 12.6  --   --   --   --   --  10.6*  --   --  9.1*  --   HCT 39.5  --   --   --   --   --  32.1*  --   --  27.4*  --   PLT 205  --   --   --   --   --  169  --   --  170  --   APTT  --   --  23*  --   --   --   --   --   --   --   --   LABPROT  --  15.5*  --   --   --   --   --   --   --   --   --   INR  --  1.2  --   --   --   --   --   --   --   --   --   HEPARINUNFRC  --   --   --   --   --    < >  --  0.87* 0.50  --  0.33  CREATININE 2.77*  --   --   --   --   --  1.80*  --   --  1.17*  --   TROPONINIHS  --   --   --  5 5  --   --   --   --   --   --    < > = values in this interval not displayed.   Estimated Creatinine Clearance: 39.5 mL/min (A) (by C-G formula based on SCr of 1.17 mg/dL (H)).  Medical History: Past Medical History:  Diagnosis Date   Anxiety    Arthritis    back   Dehydration    Depression    GERD (gastroesophageal reflux disease)    Hypertension    controlled on meds   Hypotension    Pre-diabetes    SIRS (systemic inflammatory response syndrome) (HCC)    Wears partial dentures    upper   Medications:  No PTA anticoagulation   Baseline: Baseline labs: Hgb 12.6, PLT 205, INR 1.2, aPTT pending.   Assessment: Carmen [name] is a 72 year old female that presented with leg swelling and discoloration.  Patient states that she has had some discomfort for about 1 week and noticed it was somewhat discolored today. Imaging to rule out DVT and PE has been ordered. Pharmacy has been consulted for management of a heparin infusion.   Goal of Therapy:  Heparin level 0.3-0.7 units/ml Monitor platelets by anticoagulation protocol: Yes   Levels Time Levels Clinical  11/24 @ 2352 HL 0.74 Supratherapeutic   11/25 @ 1200 HL 0.87 Supratherapeutic  11/25 @ 2017 HL 0.50 Therapeutic x 1  11/ 25 @ 1053 HL 0.33 Therapeutic x1       Plan:  Continue heparin 950 units/hr Recheck HL in 8 hours Between 0200 and 0434 infusin stopped due to patient removing both PIVs. This explains drop will recheck in 8 hours to make sure stays therapeutic.  Monitor CBC and platelets daily   Effie Shy, PharmD Pharmacy Resident  03/14/2023 11:14 AM

## 2023-03-15 ENCOUNTER — Encounter: Payer: Self-pay | Admitting: Vascular Surgery

## 2023-03-15 DIAGNOSIS — I82412 Acute embolism and thrombosis of left femoral vein: Secondary | ICD-10-CM | POA: Diagnosis not present

## 2023-03-15 LAB — CBC
HCT: 19.5 % — ABNORMAL LOW (ref 36.0–46.0)
Hemoglobin: 6.4 g/dL — ABNORMAL LOW (ref 12.0–15.0)
MCH: 31.4 pg (ref 26.0–34.0)
MCHC: 32.8 g/dL (ref 30.0–36.0)
MCV: 95.6 fL (ref 80.0–100.0)
Platelets: 144 10*3/uL — ABNORMAL LOW (ref 150–400)
RBC: 2.04 MIL/uL — ABNORMAL LOW (ref 3.87–5.11)
RDW: 13 % (ref 11.5–15.5)
WBC: 8.1 10*3/uL (ref 4.0–10.5)
nRBC: 0 % (ref 0.0–0.2)

## 2023-03-15 LAB — HEPARIN LEVEL (UNFRACTIONATED): Heparin Unfractionated: 0.33 [IU]/mL (ref 0.30–0.70)

## 2023-03-15 LAB — BASIC METABOLIC PANEL
Anion gap: 4 — ABNORMAL LOW (ref 5–15)
BUN: 26 mg/dL — ABNORMAL HIGH (ref 8–23)
CO2: 23 mmol/L (ref 22–32)
Calcium: 7.7 mg/dL — ABNORMAL LOW (ref 8.9–10.3)
Chloride: 108 mmol/L (ref 98–111)
Creatinine, Ser: 1.05 mg/dL — ABNORMAL HIGH (ref 0.44–1.00)
GFR, Estimated: 56 mL/min — ABNORMAL LOW (ref >60)
Glucose, Bld: 122 mg/dL — ABNORMAL HIGH (ref 70–99)
Potassium: 3.2 mmol/L — ABNORMAL LOW (ref 3.5–5.1)
Sodium: 135 mmol/L (ref 135–145)

## 2023-03-15 LAB — PREPARE RBC (CROSSMATCH)

## 2023-03-15 LAB — HEMOGLOBIN AND HEMATOCRIT, BLOOD
HCT: 24.1 % — ABNORMAL LOW (ref 36.0–46.0)
Hemoglobin: 8.2 g/dL — ABNORMAL LOW (ref 12.0–15.0)

## 2023-03-15 MED ORDER — SODIUM CHLORIDE 0.9% IV SOLUTION
Freq: Once | INTRAVENOUS | Status: DC
Start: 2023-03-15 — End: 2023-03-15

## 2023-03-15 MED ORDER — HYDROCODONE-ACETAMINOPHEN 5-325 MG PO TABS: 1.0000 | ORAL_TABLET | Freq: Four times a day (QID) | ORAL | 0 refills | Status: AC | PRN

## 2023-03-15 MED ORDER — APIXABAN 5 MG PO TABS: 10.0000 mg | ORAL_TABLET | Freq: Two times a day (BID) | ORAL | 0 refills | Status: DC

## 2023-03-15 MED ORDER — POTASSIUM CHLORIDE CRYS ER 20 MEQ PO TBCR
40.0000 meq | EXTENDED_RELEASE_TABLET | Freq: Once | ORAL | Status: AC
  Administered 2023-03-15: 40 meq via ORAL
  Filled 2023-03-15: qty 2

## 2023-03-15 MED ORDER — APIXABAN 5 MG PO TABS: 5.0000 mg | ORAL_TABLET | Freq: Two times a day (BID) | ORAL | 0 refills | Status: DC

## 2023-03-15 NOTE — Progress Notes (Signed)
       CROSS COVER NOTE  NAME: Carmen [name] MRN: [id number] DOB : 03-May-1950    Date of Service   03/15/2023   HPI/Events of Note   Patient's hemoglobin resulted at 6.4 g/dL this a.m.  Patient is currently on heparin infusion but has no sign of active bleeding at this point.  Patient's heparin levels are within therapeutic levels.  Chart review showed that patient had about 600 cc of EBL during her procedure yesterday.  Vital signs are stable at this time.  Interventions   Patient to continue on heparin drip per pharmacy. New type and screen ordered. 1 unit of PRBCs ordered to be transfused this morning.       Noell Lorensen Lamin Geradine Girt, MSN, APRN, AGACNP-BC [facility] [place] Pager: [phone]. Check Amion for Availability

## 2023-03-15 NOTE — Care Management Important Message (Signed)
Important Message  Patient Details  Name: Carmen [name] MRN: [id number] Date of Birth: 25-Mar-1951   Important Message Given:  N/A - LOS <3 / Initial given by admissions     Carmen [name] 03/15/2023, 9:29 AM

## 2023-03-15 NOTE — Discharge Summary (Signed)
Physician Discharge Summary  Carmen [name] BJY:[id number] DOB: 04-04-1951 DOA: 03/12/2023  PCP: Marguarite Arbour, MD  Admit date: 03/12/2023 Discharge date: 03/15/2023  Admitted From: home  Disposition:  home   Recommendations for Outpatient Follow-up:  Follow up with PCP in 1-2 weeks F/u w/ vasc surg, Dr. Wyn Quaker, in 2 weeks   Home Health: no  Equipment/Devices:  Discharge Condition: stable  CODE STATUS: full  Diet recommendation: Heart Healthy  Brief/Interim Summary: HPI from Dr. Irena Cords: Carmen [name] is a 72 y.o. female with medical history significant for hypertension, depression, anxiety comes to the emergency room today for left leg swelling.  Per nurses initial triage note patient woke up yesterday and had pain in her left leg and as the day went on she started to have swelling which got progressively worse patient was not able to stand or bear weight and EMS was called and initial vitals of 104/60 heart rate 94 O2 sats of 96% on room air.  Patient's limb noted to be purplish on initial eval has Doppler pulses only.   Chart review shows that patient was seen in the emergency room on November 7 for diarrhea diagnosed with C. difficile and continued on p.o. vancomycin and given IV fluids.   On initial presentation here in the hospital patient was hypotensive in shock with systolic of 63/38 heart rate was 100 respirations of 18 afebrile 97.6 O2 sats of 95%. In emergency room vitals trend shows: Multiple Vitals        Vitals:    03/12/23 1846 03/12/23 2000 03/12/23 2100 03/12/23 2130  BP:   137/62 (!) 144/82 (!) 147/82  Pulse:   84 99 96  Temp: 97.8 F (36.6 C)     98.2 F (36.8 C)  Resp:   20 17 18   Height:       5\' 2"  (1.575 m)  Weight:       70.6 kg  SpO2:          TempSrc: Oral     Oral  BMI (Calculated):       28.46    stat EKG showed sinus tach at 107 narrow QRS normal axis no ST-T wave changes.  BNP of 14.3, troponin of 5 Labs are notable for  : Metabolic panel showing mild hyponatremia of 132, non-anion gap metabolic acidosis with bicarb of 17 anion gap of 15, AKI on CKD with a creatinine of 2.77 EGFR of 18 glucose 138, normal LFTs Initial lactic of 3.3. Repeat lactic of 1.2. Leukocytosis of 15 normal hemoglobin and normal platelet count.  Discharge Diagnoses:  Principal Problem:   Leg pain, left Active Problems:   DVT (deep venous thrombosis) (HCC)   Acute pulmonary embolism (HCC)   Acute hypotension   Absent pedal pulses   Acute kidney injury superimposed on stage 4 chronic kidney disease (HCC)   Asthma, chronic   PE (pulmonary thromboembolism) (HCC)  Occlusive DVT: L common femoral thru tibial veins. S/p thrombectomy. Continue on IV heparin. D/c home on eliquis as per vasc surg.   Bilateral PE: R>L, no RV strain. Continue on IV heparin. S/p pulmon thrombectomy on 11/25 as per vasc surg. D/c home on eliquis as per vasc surg     AKI on CKDIIIa: Cr is trending down from day prior. Avoid nephrotoxic meds    Sinus tachycardia: resolved    Leukocytosis: resolved    HTN: continue on metoprolol, veapamil, losartan   Major depression: severity unknown. Continue on home dose of  sertraline, venlafaxine, olanzapine, trazodone,    Hypokalemia: potassium given    Discharge Instructions  Discharge Instructions     Diet - low sodium heart healthy   Complete by: As directed    Discharge instructions   Complete by: As directed    F/u w/ vasc surg, Dr. Wyn Quaker, in 2 weeks. F/u w/ PCP in 1-2 weeks   Increase activity slowly   Complete by: As directed       Allergies as of 03/15/2023       Reactions   Prednisone Other (See Comments)   Severe depression        Medication List     TAKE these medications    apixaban 5 MG Tabs tablet Commonly known as: Eliquis Take 2 tablets (10 mg total) by mouth 2 (two) times daily for 7 days.   apixaban 5 MG Tabs tablet Commonly known as: ELIQUIS Take 1 tablet (5 mg total) by  mouth 2 (two) times daily. Only start this script after completing eliquis 10 mg BID x 7 days   clonazePAM 0.5 MG tablet Commonly known as: KLONOPIN Take 1 mg by mouth at bedtime. TAKES 1 OR 2   HYDROcodone-acetaminophen 5-325 MG tablet Commonly known as: NORCO/VICODIN Take 1 tablet by mouth every 6 (six) hours as needed for up to 5 days for severe pain (pain score 7-10).   losartan-hydrochlorothiazide 100-12.5 MG tablet Commonly known as: HYZAAR Take 1 tablet by mouth daily.   metoprolol succinate 50 MG 24 hr tablet Commonly known as: TOPROL-XL Take 50 mg by mouth daily. Take with or immediately following a meal.   OLANZapine 10 MG tablet Commonly known as: ZYPREXA Take 10 mg by mouth at bedtime.   potassium chloride 10 MEQ tablet Commonly known as: KLOR-CON Take 10 mEq by mouth daily.   sertraline 100 MG tablet Commonly known as: ZOLOFT Take 200 mg by mouth daily.   traZODone 100 MG tablet Commonly known as: DESYREL Take 100 mg by mouth at bedtime.   venlafaxine XR 75 MG 24 hr capsule Commonly known as: EFFEXOR-XR Take 75 mg by mouth daily with breakfast.   verapamil 240 MG CR tablet Commonly known as: CALAN-SR Take 240 mg by mouth daily.   vitamin B-12 100 MCG tablet Commonly known as: CYANOCOBALAMIN Take 100 mcg by mouth daily.        Follow-up Information     Dew, Marlow Baars, MD Follow up in 2 week(s).   Specialties: Vascular Surgery, Radiology, Interventional Cardiology Why: U/S left lower extremity for DVT post thrombectomy check Contact information: [address] [place] [address] [phone]                Allergies  Allergen Reactions   Prednisone Other (See Comments)    Severe depression    Consultations: Vasc surg    Procedures/Studies: ECHOCARDIOGRAM COMPLETE BUBBLE STUDY  Result Date: 03/14/2023    ECHOCARDIOGRAM REPORT   Patient Name:   Carmen [name] Date of Exam: 03/14/2023 Medical Rec #:   [id number]           Height:       62.0 in Accession #:    [id number]          Weight:       157.0 lb Date of Birth:  11-07-1950            BSA:          1.725 m Patient Age:    72 years  BP:           147/76 mmHg Patient Gender: F                   HR:           82 bpm. Exam Location:  [facility] Procedure: 2D Echo, Cardiac Doppler, Color Doppler and Saline Contrast Bubble            Study Indications:     Pulmonary embolus                  DVT                  hypotension  History:         Patient has no prior history of Echocardiogram examinations.                  Risk Factors:Hypertension. Anxiety.  Sonographer:     Cristela Blue Referring Phys:  [id number] Marlow Baars DEW Diagnosing Phys: Alwyn Pea MD IMPRESSIONS  1. Left ventricular ejection fraction, by estimation, is 50 to 55%. The left ventricle has low normal function. The left ventricle has no regional wall motion abnormalities. Left ventricular diastolic parameters are consistent with Grade I diastolic dysfunction (impaired relaxation).  2. Right ventricular systolic function is normal. The right ventricular size is normal.  3. The mitral valve is normal in structure. Trivial mitral valve regurgitation.  4. The aortic valve is normal in structure. Aortic valve regurgitation is not visualized. FINDINGS  Left Ventricle: Left ventricular ejection fraction, by estimation, is 50 to 55%. The left ventricle has low normal function. The left ventricle has no regional wall motion abnormalities. The left ventricular internal cavity size was normal in size. There is no left ventricular hypertrophy. Left ventricular diastolic parameters are consistent with Grade I diastolic dysfunction (impaired relaxation). Right Ventricle: The right ventricular size is normal. No increase in right ventricular wall thickness. Right ventricular systolic function is normal. Left Atrium: Left atrial size was normal in size. Right Atrium: Right atrial size was normal in size.  Pericardium: There is no evidence of pericardial effusion. Mitral Valve: The mitral valve is normal in structure. Trivial mitral valve regurgitation. MV peak gradient, 4.6 mmHg. The mean mitral valve gradient is 1.0 mmHg. Tricuspid Valve: The tricuspid valve is normal in structure. Tricuspid valve regurgitation is trivial. Aortic Valve: The aortic valve is normal in structure. Aortic valve regurgitation is not visualized. Aortic valve mean gradient measures 3.0 mmHg. Aortic valve peak gradient measures 6.0 mmHg. Aortic valve area, by VTI measures 2.82 cm. Pulmonic Valve: The pulmonic valve was normal in structure. Pulmonic valve regurgitation is not visualized. Aorta: The ascending aorta was not well visualized. IAS/Shunts: No atrial level shunt detected by color flow Doppler. Agitated saline contrast was given intravenously to evaluate for intracardiac shunting.  LEFT VENTRICLE PLAX 2D LVIDd:         4.60 cm   Diastology LVIDs:         3.40 cm   LV e' medial:    7.94 cm/s LV PW:         1.00 cm   LV E/e' medial:  9.7 LV IVS:        0.80 cm   LV e' lateral:   12.60 cm/s LVOT diam:     2.00 cm   LV E/e' lateral: 6.1 LV SV:         72 LV SV Index:   42 LVOT Area:  3.14 cm  RIGHT VENTRICLE RV Basal diam:  3.50 cm RV Mid diam:    2.70 cm RV S prime:     13.60 cm/s TAPSE (M-mode): 2.0 cm LEFT ATRIUM           Index        RIGHT ATRIUM          Index LA diam:      2.70 cm 1.57 cm/m   RA Area:     8.81 cm LA Vol (A2C): 9.8 ml  5.69 ml/m   RA Volume:   13.90 ml 8.06 ml/m LA Vol (A4C): 17.6 ml 10.21 ml/m  AORTIC VALVE AV Area (Vmax):    2.78 cm AV Area (Vmean):   2.72 cm AV Area (VTI):     2.82 cm AV Vmax:           122.00 cm/s AV Vmean:          80.800 cm/s AV VTI:            0.255 m AV Peak Grad:      6.0 mmHg AV Mean Grad:      3.0 mmHg LVOT Vmax:         108.00 cm/s LVOT Vmean:        70.000 cm/s LVOT VTI:          0.229 m LVOT/AV VTI ratio: 0.90  AORTA Ao Root diam: 2.90 cm MITRAL VALVE                 TRICUSPID VALVE MV Area (PHT): 3.17 cm     TR Peak grad:   34.1 mmHg MV Area VTI:   2.19 cm     TR Vmax:        292.00 cm/s MV Peak grad:  4.6 mmHg MV Mean grad:  1.0 mmHg     SHUNTS MV Vmax:       1.07 m/s     Systemic VTI:  0.23 m MV Vmean:      53.7 cm/s    Systemic Diam: 2.00 cm MV Decel Time: 239 msec MV E velocity: 77.00 cm/s MV A velocity: 117.00 cm/s MV E/A ratio:  0.66 Dwayne D Callwood MD Electronically signed by Alwyn Pea MD Signature Date/Time: 03/14/2023/6:41:35 PM    Final    PERIPHERAL VASCULAR CATHETERIZATION  Result Date: 03/14/2023 See surgical note for result.  PERIPHERAL VASCULAR CATHETERIZATION  Result Date: 03/13/2023 See surgical note for result.  CT Angio Chest PE W and/or Wo Contrast  Addendum Date: 03/12/2023   ADDENDUM REPORT: 03/12/2023 20:46 ADDENDUM: These results were called by telephone at the time of interpretation on 03/12/2023 at 6:44 pm to provider Dr. Vicente Males, who verbally acknowledged these results. Electronically Signed   By: Darliss Cheney M.D.   On: 03/12/2023 20:46   Result Date: 03/12/2023 CLINICAL DATA:  High probability for PE. EXAM: CT ANGIOGRAPHY CHEST WITH CONTRAST TECHNIQUE: Multidetector CT imaging of the chest was performed using the standard protocol during bolus administration of intravenous contrast. Multiplanar CT image reconstructions and MIPs were obtained to evaluate the vascular anatomy. RADIATION DOSE REDUCTION: This exam was performed according to the departmental dose-optimization program which includes automated exposure control, adjustment of the mA and/or kV according to patient size and/or use of iterative reconstruction technique. CONTRAST:  75mL OMNIPAQUE IOHEXOL 350 MG/ML SOLN COMPARISON:  None Available. FINDINGS: Cardiovascular: There are pulmonary emboli in the distal right main pulmonary artery extending into segmental branches of the right middle lobe,  right lower lobe and right upper lobe. There also smaller  pulmonary emboli within segmental branches of the left upper lobe and left lower lobe. Heart and aorta are normal in size. There is no pericardial effusion. There are atherosclerotic calcifications of the aorta and coronary arteries. Mediastinum/Nodes: No enlarged mediastinal, hilar, or axillary lymph nodes. Thyroid gland, trachea, and esophagus demonstrate no significant findings. There is a small hiatal hernia. Lungs/Pleura: There is a 2 mm ground-glass nodule in the right upper lobe image 6/15. The lungs are otherwise clear. There is no pleural effusion or pneumothorax. Upper Abdomen: No acute abnormality. Musculoskeletal: Cervical spinal fusion hardware is present. L1 vertebroplasty changes are present. Review of the MIP images confirms the above findings. IMPRESSION: 1. Bilateral pulmonary emboli, right greater than left. No evidence for right heart strain. 2. 2 mm ground-glass nodule in the right upper lobe. No follow-up needed if patient is low-risk.This recommendation follows the consensus statement: Guidelines for Management of Incidental Pulmonary Nodules Detected on CT Images: From the Fleischner Society 2017; Radiology 2017; 284:228-243. 3. Small hiatal hernia. 4. Aortic atherosclerosis. Aortic Atherosclerosis (ICD10-I70.0). Electronically Signed: By: Darliss Cheney M.D. On: 03/12/2023 18:40   US Venous Img Lower Unilateral Left  Result Date: 03/12/2023 CLINICAL DATA:  Left lower extremity swelling and discoloration. Evaluate for DVT. EXAM: LEFT LOWER EXTREMITY VENOUS DOPPLER ULTRASOUND TECHNIQUE: Gray-scale sonography with graded compression, as well as color Doppler and duplex ultrasound were performed to evaluate the lower extremity deep venous systems from the level of the common femoral vein and including the common femoral, femoral, profunda femoral, popliteal and calf veins including the posterior tibial, peroneal and gastrocnemius veins when visible. The superficial great saphenous vein was  also interrogated. Spectral Doppler was utilized to evaluate flow at rest and with distal augmentation maneuvers in the common femoral, femoral and popliteal veins. COMPARISON:  None Available. FINDINGS: Contralateral Common Femoral Vein: Respiratory phasicity is normal and symmetric with the symptomatic side. No evidence of thrombus. Normal compressibility. There is hypoechoic occlusive thrombus involving the left common femoral vein (images 2 and 3), extending to involve the saphenofemoral junction as well as the proximal aspect of the left greater saphenous vein (image 6). There is hypoechoic occlusive thrombus involving the imaged portions of the left deep femoral vein (image 8 and 10). There is hypoechoic occlusive thrombus involving the proximal (image 8 and 12), mid (images 13 and 14) and distal (images 16 and 17) aspects of the left femoral vein There is hypoechoic occlusive thrombus involving the left popliteal vein (images 20 and 21, extending to involve both paired divisions of the left posterior tibial vein (image 29 and 30 as well as both paired divisions of the left peroneal vein (images 31 and 32). IMPRESSION: The examination is positive for extensive occlusive DVT extending from the left common femoral vein through the imaged tibial veins. Electronically Signed   By: Simonne Come M.D.   On: 03/12/2023 15:54   (Echo, Carotid, EGD, Colonoscopy, ERCP)    Subjective: Pt denies any chest pain or shortness of breath    Discharge Exam: Vitals:   03/15/23 0839 03/15/23 1300  BP: (!) 111/54   Pulse: 79   Resp: 16   Temp: 97.8 F (36.6 C) 98.4 F (36.9 C)  SpO2: 96% 96%   Vitals:   03/15/23 0538 03/15/23 0555 03/15/23 0839 03/15/23 1300  BP: (!) 91/57 (!) 94/48 (!) 111/54   Pulse: 72 65 79   Resp: 13 13 16    Temp:  97.8 F (36.6 C) 97.8 F (36.6 C) 98.4 F (36.9 C)  TempSrc:  Axillary Axillary Oral  SpO2: 97% 97% 96% 96%  Weight:      Height:        General: Pt is alert, awake,  not in acute distress Cardiovascular: S1/S2 +, no rubs, no gallops Respiratory: CTA bilaterally, no wheezing, no rhonchi Abdominal: Soft, NT, ND, bowel sounds + Extremities: no cyanosis    The results of significant diagnostics from this hospitalization (including imaging, microbiology, ancillary and laboratory) are listed below for reference.     Microbiology: Recent Results (from the past 240 hour(s))  MRSA Next Gen by PCR, Nasal     Status: None   Collection Time: 03/12/23  9:34 PM   Specimen: Nasal Mucosa; Nasal Swab  Result Value Ref Range Status   MRSA by PCR Next Gen NOT DETECTED NOT DETECTED Final    Comment: (NOTE) The GeneXpert MRSA Assay (FDA approved for NASAL specimens only), is one component of a comprehensive MRSA colonization surveillance program. It is not intended to diagnose MRSA infection nor to guide or monitor treatment for MRSA infections. Test performance is not FDA approved in patients less than 15 years old. Performed at [facility], [address] [place], [place] [address]      Labs: BNP (last 3 results) Recent Labs    03/12/23 1547  BNP 14.3   Basic Metabolic Panel: Recent Labs  Lab 03/12/23 1444 03/13/23 0324 03/14/23 0459 03/15/23 0154  NA 132* 135 136 135  K 4.7 3.3* 3.4* 3.2*  CL 100 106 106 108  CO2 17* 21* 22 23  GLUCOSE 138* 97 108* 122*  BUN 47* 43* 34* 26*  CREATININE 2.77* 1.80* 1.17* 1.05*  CALCIUM 9.3 8.4* 8.6* 7.7*   Liver Function Tests: Recent Labs  Lab 03/12/23 1444 03/13/23 0324  AST 20 12*  ALT 13 11  ALKPHOS 71 61  BILITOT 0.9 0.5  PROT 8.1 6.7  ALBUMIN 4.3 3.5   No results for input(s): "LIPASE", "AMYLASE" in the last 168 hours. No results for input(s): "AMMONIA" in the last 168 hours. CBC: Recent Labs  Lab 03/12/23 1444 03/13/23 0324 03/14/23 0459 03/15/23 0154 03/15/23 1332  WBC 15.0* 11.1* 9.4 8.1  --   NEUTROABS 13.2*  --   --   --   --   HGB 12.6 10.6* 9.1* 6.4* 8.2*  HCT  39.5 32.1* 27.4* 19.5* 24.1*  MCV 97.8 95.0 91.9 95.6  --   PLT 205 169 170 144*  --    Cardiac Enzymes: No results for input(s): "CKTOTAL", "CKMB", "CKMBINDEX", "TROPONINI" in the last 168 hours. BNP: Invalid input(s): "POCBNP" CBG: Recent Labs  Lab 03/12/23 2121  GLUCAP 115*   D-Dimer No results for input(s): "DDIMER" in the last 72 hours. Hgb A1c No results for input(s): "HGBA1C" in the last 72 hours. Lipid Profile No results for input(s): "CHOL", "HDL", "LDLCALC", "TRIG", "CHOLHDL", "LDLDIRECT" in the last 72 hours. Thyroid function studies No results for input(s): "TSH", "T4TOTAL", "T3FREE", "THYROIDAB" in the last 72 hours.  Invalid input(s): "FREET3" Anemia work up No results for input(s): "VITAMINB12", "FOLATE", "FERRITIN", "TIBC", "IRON", "RETICCTPCT" in the last 72 hours. Urinalysis    Component Value Date/Time   COLORURINE STRAW (A) 02/23/2023 1231   APPEARANCEUR HAZY (A) 02/23/2023 1231   LABSPEC 1.003 (L) 02/23/2023 1231   PHURINE 5.0 02/23/2023 1231   GLUCOSEU NEGATIVE 02/23/2023 1231   HGBUR NEGATIVE 02/23/2023 1231   BILIRUBINUR NEGATIVE 02/23/2023 1231  KETONESUR NEGATIVE 02/23/2023 1231   PROTEINUR NEGATIVE 02/23/2023 1231   NITRITE NEGATIVE 02/23/2023 1231   LEUKOCYTESUR TRACE (A) 02/23/2023 1231   Sepsis Labs Recent Labs  Lab 03/12/23 1444 03/13/23 0324 03/14/23 0459 03/15/23 0154  WBC 15.0* 11.1* 9.4 8.1   Microbiology Recent Results (from the past 240 hour(s))  MRSA Next Gen by PCR, Nasal     Status: None   Collection Time: 03/12/23  9:34 PM   Specimen: Nasal Mucosa; Nasal Swab  Result Value Ref Range Status   MRSA by PCR Next Gen NOT DETECTED NOT DETECTED Final    Comment: (NOTE) The GeneXpert MRSA Assay (FDA approved for NASAL specimens only), is one component of a comprehensive MRSA colonization surveillance program. It is not intended to diagnose MRSA infection nor to guide or monitor treatment for MRSA infections. Test  performance is not FDA approved in patients less than 40 years old. Performed at [facility], [address], [place], [place] [address]      Time coordinating discharge: Over 30 minutes  SIGNED:   Charise Killian, MD  [facility] 03/15/2023, 3:38 PM Pager   If 7PM-7AM, please contact night-coverage [email or web address]

## 2023-03-15 NOTE — Evaluation (Signed)
Physical Therapy Evaluation Patient Details Name: Carmen [name] MRN: [id number] DOB: [birth year] Today's Date: 03/15/2023  History of Present Illness  Carmen [name] is a 72 yo female who is now POD #1 from a pulmonary Thrombectomy with IVC placement and left lower extremity thrombectomy.  Clinical Impression  Patient received in bed, she is eager to ambulate so she can go home. Patient is mod I with bed mobility. Transfers with cga and ambulated 150 feet with RW. No significant pain. Patient will be returning home with family. No continued skilled needs at this time.         If plan is discharge home, recommend the following: A little help with walking and/or transfers;Assist for transportation   Can travel by private vehicle    yes    Equipment Recommendations None recommended by PT  Recommendations for Other Services       Functional Status Assessment Patient has had a recent decline in their functional status and demonstrates the ability to make significant improvements in function in a reasonable and predictable amount of time.     Precautions / Restrictions Precautions Precautions: Fall Restrictions Weight Bearing Restrictions: No      Mobility  Bed Mobility Overal bed mobility: Modified Independent                  Transfers Overall transfer level: Needs assistance Equipment used: Rolling walker (2 wheels) Transfers: Sit to/from Stand Sit to Stand: Contact guard assist, Supervision           General transfer comment: supervision from commode using bar, cga from bed    Ambulation/Gait Ambulation/Gait assistance: Supervision, Contact guard assist Gait Distance (Feet): 150 Feet Assistive device: Rolling walker (2 wheels) Gait Pattern/deviations: Step-through pattern Gait velocity: slightly decreased     General Gait Details: patient ambulating well, no reports of pain.  Stairs            Wheelchair Mobility     Tilt Bed     Modified Rankin (Stroke Patients Only)       Balance Overall balance assessment: Modified Independent                                           Pertinent Vitals/Pain Pain Assessment Pain Assessment: No/denies pain    Home Living Family/patient expects to be discharged to:: Private residence Living Arrangements: Spouse/significant other Available Help at Discharge: Family;Available 24 hours/day Type of Home: House Home Access: Level entry       Home Layout: One level Home Equipment: Agricultural consultant (2 wheels);Cane - single point Additional Comments: they ordered a rollator as well    Prior Function Prior Level of Function : Independent/Modified Independent                     Extremity/Trunk Assessment   Upper Extremity Assessment Upper Extremity Assessment: Overall WFL for tasks assessed    Lower Extremity Assessment Lower Extremity Assessment: Overall WFL for tasks assessed    Cervical / Trunk Assessment Cervical / Trunk Assessment: Normal  Communication   Communication Communication: No apparent difficulties Cueing Techniques: Verbal cues  Cognition Arousal: Alert Behavior During Therapy: WFL for tasks assessed/performed Overall Cognitive Status: Within Functional Limits for tasks assessed  General Comments      Exercises     Assessment/Plan    PT Assessment Patient does not need any further PT services  PT Problem List Decreased mobility;Decreased activity tolerance       PT Treatment Interventions      PT Goals (Current goals can be found in the Care Plan section)  Acute Rehab PT Goals Patient Stated Goal: go home PT Goal Formulation: With patient/family Time For Goal Achievement: 03/17/23 Potential to Achieve Goals: Good    Frequency       Co-evaluation               AM-PAC PT "6 Clicks" Mobility  Outcome Measure Help needed turning from  your back to your side while in a flat bed without using bedrails?: None Help needed moving from lying on your back to sitting on the side of a flat bed without using bedrails?: None Help needed moving to and from a bed to a chair (including a wheelchair)?: None Help needed standing up from a chair using your arms (e.g., wheelchair or bedside chair)?: None Help needed to walk in hospital room?: A Little Help needed climbing 3-5 steps with a railing? : A Little 6 Click Score: 22    End of Session   Activity Tolerance: Patient tolerated treatment well Patient left: in bed;with call bell/phone within reach;with family/visitor present Nurse Communication: Mobility status PT Visit Diagnosis: Pain Pain - Right/Left: Left Pain - part of body: Leg    Time: 5284-1324 PT Time Calculation (min) (ACUTE ONLY): 16 min   Charges:   PT Evaluation $PT Eval Moderate Complexity: 1 Mod PT Treatments $Gait Training: 8-22 mins PT General Charges $$ ACUTE PT VISIT: 1 Visit         Takoda Janowiak, PT, [facility] 03/15/23,2:32 PM

## 2023-03-15 NOTE — Progress Notes (Signed)
  Progress Note    03/15/2023 9:57 AM 1 Day Post-Op  Subjective:   Carmen [name] is a 72 yo female who is now POD #1 from a pulmonary Thrombectomy with IVC placement and left lower extremity thrombectomy.  Patient is recovering as expected.  Patient endorses she feels like she is breathing better.  Patient endorses that she feels her left leg is much less swollen and feels better.  Patient does endorse soreness to the right groin and soreness to her left leg..  Patient endorses left leg was painful postprocedure and through the night but is much better this morning.  No complaints overnight.  Vitals are remained stable.    Vitals:   03/15/23 0555 03/15/23 0839  BP: (!) 94/48 (!) 111/54  Pulse: 65 79  Resp: 13 16  Temp: 97.8 F (36.6 C) 97.8 F (36.6 C)  SpO2: 97% 96%   Physical Exam: Cardiac:  RRR, Normal S1, S2. No murmurs.  Lungs:  normal non-labored breathing, without Rales, rhonchi, wheezing. Diminished in the bases.  Incisions:  Right groin with dressing clean dry and intact.  Extremities:  Palpable pulses throughout except left lower extremity DP and PT due to +2 edema.  Abdomen:  Positive bowel sounds,  soft, NT/ND, no masses  Neurologic: A&O X 3; No focal weakness or paresthesias are detected; speech is fluent/normal     CBC    Component Value Date/Time   WBC 8.1 03/15/2023 0154   RBC 2.04 (L) 03/15/2023 0154   HGB 6.4 (L) 03/15/2023 0154   HCT 19.5 (L) 03/15/2023 0154   PLT 144 (L) 03/15/2023 0154   MCV 95.6 03/15/2023 0154   MCH 31.4 03/15/2023 0154   MCHC 32.8 03/15/2023 0154   RDW 13.0 03/15/2023 0154   LYMPHSABS 0.9 03/12/2023 1444   MONOABS 0.8 03/12/2023 1444   EOSABS 0.0 03/12/2023 1444   BASOSABS 0.1 03/12/2023 1444    BMET    Component Value Date/Time   NA 135 03/15/2023 0154   K 3.2 (L) 03/15/2023 0154   CL 108 03/15/2023 0154   CO2 23 03/15/2023 0154   GLUCOSE 122 (H) 03/15/2023 0154   BUN 26 (H) 03/15/2023 0154   CREATININE 1.05 (H)  03/15/2023 0154   CALCIUM 7.7 (L) 03/15/2023 0154   GFRNONAA 56 (L) 03/15/2023 0154   GFRAA >60 04/16/2019 1042    INR    Component Value Date/Time   INR 1.2 03/12/2023 1455     Intake/Output Summary (Last 24 hours) at 03/15/2023 0957 Last data filed at 03/15/2023 0910 Gross per 24 hour  Intake 1029.93 ml  Output 100 ml  Net 929.93 ml     Assessment/Plan:  72 y.o. female is s/p  Pulmonary thrombectomy with IVC placement POD #3 and left lower extremity thrombectomy POD #1. 1 Day Post-Op   PLAN: Convert patient off of heparin infusion this morning and start oral anticoagulation with Eliquis 10 mg twice daily for 10 days then convert to 5 mg twice daily indefinitely. Okay per Vascular Surgery for patient to discharge home when medically stable and ready.  Keep left lower extremity wrapped x 3 days.  Patient may shower once wrap comes off on Saturday 03/18/23 Patient [facility] in 3 weeks.   DVT prophylaxis:  Heparin Infusion.    Marcie Bal [facility] 03/15/2023 9:57 AM

## 2023-03-15 NOTE — Consult Note (Signed)
PHARMACY - ANTICOAGULATION CONSULT NOTE  Pharmacy Consult for Heparin  Indication: VTE Treatment   Allergies  Allergen Reactions   Prednisone Other (See Comments)    Severe depression   Patient Measurements: Height: 5\' 2"  (157.5 cm) Weight: 71.2 kg (156 lb 15.5 oz) IBW/kg (Calculated) : 50.1 Heparin Dosing Weight: 64 kg   Vital Signs: Temp: 97.5 F (36.4 C) (11/26 2256) Temp Source: Oral (11/26 1825) BP: 101/52 (11/26 2256) Pulse Rate: 80 (11/26 2256)  Labs: Recent Labs    03/12/23 1455 03/12/23 1457 03/12/23 1548 03/12/23 1841 03/12/23 2352 03/13/23 0324 03/13/23 1200 03/14/23 0459 03/14/23 1053 03/15/23 0154  HGB  --   --   --   --   --  10.6*  --  9.1*  --  6.4*  HCT  --   --   --   --   --  32.1*  --  27.4*  --  19.5*  PLT  --   --   --   --   --  169  --  170  --  144*  APTT  --  23*  --   --   --   --   --   --   --   --   LABPROT 15.5*  --   --   --   --   --   --   --   --   --   INR 1.2  --   --   --   --   --   --   --   --   --   HEPARINUNFRC  --   --   --   --    < >  --    < > <0.10* 0.33 0.33  CREATININE  --   --   --   --   --  1.80*  --  1.17*  --  1.05*  TROPONINIHS  --   --  5 5  --   --   --   --   --   --    < > = values in this interval not displayed.   Estimated Creatinine Clearance: 44.1 mL/min (A) (by C-G formula based on SCr of 1.05 mg/dL (H)).  Medical History: Past Medical History:  Diagnosis Date   Anxiety    Arthritis    back   Dehydration    Depression    GERD (gastroesophageal reflux disease)    Hypertension    controlled on meds   Hypotension    Pre-diabetes    SIRS (systemic inflammatory response syndrome) (HCC)    Wears partial dentures    upper   Medications:  No PTA anticoagulation   Baseline: Baseline labs: Hgb 12.6, PLT 205, INR 1.2, aPTT pending.   Assessment: Carmen [name] is a 72 year old female that presented with leg swelling and discoloration. Patient states that she has had some discomfort for about  1 week and noticed it was somewhat discolored today. Imaging to rule out DVT and PE has been ordered. Pharmacy has been consulted for management of a heparin infusion.   Goal of Therapy:  Heparin level 0.3-0.7 units/ml Monitor platelets by anticoagulation protocol: Yes   Levels Time Levels Clinical  11/24 @ 2352 HL 0.74 Supratherapeutic   11/25 @ 1200 HL 0.87 Supratherapeutic  11/25 @ 2017 HL 0.50 Therapeutic x 1  11/ 26 @ 1053 HL 0.33 Therapeutic x1  11/27 @ 0154 HL 0.33 Therapeutic x 2  Plan:  Continue heparin 950 units/hr Recheck HL daily w/ AM labs while therapeutic Monitor CBC and platelets daily RBC, Hgb/Hct, Plt down following vascular procedure w/ ~ 600 cc blood loss.  Contacted RN, no additional s/s of bleeding post op.  Will continue to follow closely  Otelia Sergeant, PharmD, [facility] 03/15/2023 2:58 AM

## 2023-03-15 NOTE — TOC Progression Note (Signed)
Transition of Care [facility]) - Progression Note    Patient Details  Name: Carmen [name] MRN: [id number] Date of Birth: Jan 17, 1951  [facility] CM/SW Contact  Truddie Hidden, RN Phone Number: 03/15/2023, 2:03 PM  Clinical Narrative:    TOC continuing to follow patient's progress throughout discharge planning.        Expected Discharge Plan and Services                                               Social Determinants of Health (SDOH) Interventions SDOH Screenings   Food Insecurity: No Food Insecurity (03/12/2023)  Housing: Low Risk  (03/12/2023)  Transportation Needs: No Transportation Needs (03/12/2023)  Utilities: Not At Risk (03/12/2023)  Tobacco Use: Medium Risk (03/13/2023)    Readmission Risk Interventions     No data to display

## 2023-03-16 LAB — TYPE AND SCREEN
ABO/RH(D): A POS
Antibody Screen: NEGATIVE
Unit division: 0

## 2023-03-16 LAB — BPAM RBC
Blood Product Expiration Date: 202412302359
ISSUE DATE / TIME: 202411270528
Unit Type and Rh: 6200

## 2023-03-28 ENCOUNTER — Encounter (INDEPENDENT_AMBULATORY_CARE_PROVIDER_SITE_OTHER): Payer: Self-pay | Admitting: Vascular Surgery

## 2023-03-28 ENCOUNTER — Ambulatory Visit (INDEPENDENT_AMBULATORY_CARE_PROVIDER_SITE_OTHER): Payer: Medicare Other | Admitting: Vascular Surgery

## 2023-03-28 VITALS — BP 111/70 | HR 92 | Resp 16 | Wt 147.6 lb

## 2023-03-28 DIAGNOSIS — I10 Essential (primary) hypertension: Secondary | ICD-10-CM | POA: Diagnosis not present

## 2023-03-28 DIAGNOSIS — N1831 Chronic kidney disease, stage 3a: Secondary | ICD-10-CM

## 2023-03-28 DIAGNOSIS — I82412 Acute embolism and thrombosis of left femoral vein: Secondary | ICD-10-CM | POA: Diagnosis not present

## 2023-03-28 DIAGNOSIS — I2609 Other pulmonary embolism with acute cor pulmonale: Secondary | ICD-10-CM | POA: Diagnosis not present

## 2023-03-28 NOTE — Assessment & Plan Note (Signed)
blood pressure control important in reducing the progression of atherosclerotic disease. On appropriate oral medications.  

## 2023-03-28 NOTE — Progress Notes (Signed)
MRN : [id number]  Carmen [name] is a 72 y.o. (1950/11/20) female who presents with chief complaint of  Chief Complaint  Patient presents with   Follow-up    [facility] 2 week follow up  .  History of Present Illness: Patient returns today in follow up of her DVT and PE.  About 2 weeks ago, she underwent pulmonary thrombectomy for large volume pulmonary embolus.  2 days later, she underwent left lower extremity venous thrombectomy for large volume thrombus as well.  An IVC filter was placed as well.  She is doing much better.  She does still have swelling in the left leg, but it is markedly improved.  Her left leg feels better although there is still occasional pain.  Her breathing is not quite 100%, but she thinks it is close.  Her husband says she is also now walking with no assistance which is something she has not done in many months.  Current Outpatient Medications  Medication Sig Dispense Refill   apixaban (ELIQUIS) 5 MG TABS tablet Take 1 tablet (5 mg total) by mouth 2 (two) times daily. Only start this script after completing eliquis 10 mg BID x 7 days 60 tablet 0   clonazePAM (KLONOPIN) 0.5 MG tablet Take 1 mg by mouth at bedtime. TAKES 1 OR 2     losartan-hydrochlorothiazide (HYZAAR) 100-12.5 MG tablet Take 1 tablet by mouth daily.      metoprolol succinate (TOPROL-XL) 50 MG 24 hr tablet Take 50 mg by mouth daily. Take with or immediately following a meal.     OLANZapine (ZYPREXA) 10 MG tablet Take 10 mg by mouth at bedtime.     potassium chloride (KLOR-CON) 10 MEQ tablet Take 10 mEq by mouth daily.     sertraline (ZOLOFT) 100 MG tablet Take 200 mg by mouth daily.     traZODone (DESYREL) 100 MG tablet Take 100 mg by mouth at bedtime.     venlafaxine XR (EFFEXOR-XR) 75 MG 24 hr capsule Take 75 mg by mouth daily with breakfast.     verapamil (CALAN-SR) 240 MG CR tablet Take 240 mg by mouth daily.  3   vitamin B-12 (CYANOCOBALAMIN) 100 MCG tablet Take 100 mcg by mouth daily.      apixaban (ELIQUIS) 5 MG TABS tablet Take 2 tablets (10 mg total) by mouth 2 (two) times daily for 7 days. 28 tablet 0   No current facility-administered medications for this visit.   Facility-Administered Medications Ordered in Other Visits  Medication Dose Route Frequency Provider Last Rate Last Admin   methacholine (PROVOCHOLINE) 12 mg/47mL (4 mg/mL) inhaler solution 12 mg  3 mL Inhalation Once Vida Rigger, MD       Followed by   methacholine (PROVOCHOLINE) 48 mg/73mL (16 mg/mL) inhaler solution 48 mg  3 mL Inhalation Once Vida Rigger, MD        Past Medical History:  Diagnosis Date   Anxiety    Arthritis    back   Dehydration    Depression    GERD (gastroesophageal reflux disease)    Hypertension    controlled on meds   Hypotension    Pre-diabetes    SIRS (systemic inflammatory response syndrome) (HCC)    Wears partial dentures    upper    Past Surgical History:  Procedure Laterality Date   ABDOMINAL HYSTERECTOMY     CATARACT EXTRACTION W/PHACO Right 03/21/2016   Procedure: CATARACT EXTRACTION PHACO AND INTRAOCULAR LENS PLACEMENT (IOC);  Surgeon: Lavella Hammock Vin-Parikh,  MD;  Location: [facility];  Service: Ophthalmology;  Laterality: Right;  RIGHT   CATARACT EXTRACTION W/PHACO Left 05/09/2016   Procedure: CATARACT EXTRACTION PHACO AND INTRAOCULAR LENS PLACEMENT (IOC)  Left;  Surgeon: Sherald Hess, MD;  Location: [facility];  Service: Ophthalmology;  Laterality: Left;   CERVICAL SPINE SURGERY  2009   discectomy   COLONOSCOPY     COLONOSCOPY WITH PROPOFOL N/A 01/25/2022   Procedure: COLONOSCOPY WITH PROPOFOL;  Surgeon: Regis Bill, MD;  Location: [facility];  Service: Endoscopy;  Laterality: N/A;   KYPHOPLASTY N/A 01/18/2018   Procedure: [id number];  Surgeon: Kennedy Bucker, MD;  Location: [facility];  Service: Orthopedics;  Laterality: N/A;   LOWER EXTREMITY INTERVENTION Left 03/14/2023   Procedure: LOWER EXTREMITY  INTERVENTION;  Surgeon: Annice Needy, MD;  Location: [facility];  Service: Cardiovascular;  Laterality: Left;  Lower Extremity Thrombectomy. Schedule after Dr Wyn Quaker [facility] 4 pm or after.   LUMBAR LAMINECTOMY/ DECOMPRESSION WITH MET-RX Left 04/22/2019   Procedure: LEFT LATERAL L4-5 DISCECTOMY WITH METRX;  Surgeon: Lucy Chris, MD;  Location: [facility];  Service: Neurosurgery;  Laterality: Left;   PULMONARY THROMBECTOMY N/A 03/13/2023   Procedure: PULMONARY THROMBECTOMY;  Surgeon: Annice Needy, MD;  Location: [facility];  Service: Cardiovascular;  Laterality: N/A;     Social History   Tobacco Use   Smoking status: Former    Current packs/day: 0.00    Average packs/day: 1 pack/day for 20.0 years (20.0 ttl pk-yrs)    Types: Cigarettes    Start date: 42    Quit date: 70    Years since quitting: 39.9   Smokeless tobacco: Never   Tobacco comments:    quit 32 years ago  Vaping Use   Vaping status: Never Used  Substance Use Topics   Alcohol use: No   Drug use: No       Family History  Problem Relation Age of Onset   Breast cancer Sister 40     Allergies  Allergen Reactions   Prednisone Other (See Comments)    Severe depression     REVIEW OF SYSTEMS (Negative unless checked)  Constitutional: [] Weight loss  [] Fever  [] Chills Cardiac: [] Chest pain   [] Chest pressure   [] Palpitations   [] Shortness of breath when laying flat   [] Shortness of breath at rest   [x] Shortness of breath with exertion. Vascular:  [] Pain in legs with walking   [] Pain in legs at rest   [] Pain in legs when laying flat   [] Claudication   [] Pain in feet when walking  [] Pain in feet at rest  [] Pain in feet when laying flat   [x] History of DVT   [x] Phlebitis   [x] Swelling in legs   [] Varicose veins   [] Non-healing ulcers Pulmonary:   [] Uses home oxygen   [] Productive cough   [] Hemoptysis   [] Wheeze  [] COPD   [] Asthma Neurologic:  [] Dizziness  [] Blackouts   [] Seizures   [] History of  stroke   [] History of TIA  [] Aphasia   [] Temporary blindness   [] Dysphagia   [] Weakness or numbness in arms   [] Weakness or numbness in legs Musculoskeletal:  [x] Arthritis   [] Joint swelling   [] Joint pain   [x] Low back pain Hematologic:  [] Easy bruising  [] Easy bleeding   [] Hypercoagulable state   [] Anemic   Gastrointestinal:  [] Blood in stool   [] Vomiting blood  [x] Gastroesophageal reflux/heartburn   [] Abdominal pain Genitourinary:  [] Chronic kidney disease   [] Difficult urination  []   Frequent urination  [] Burning with urination   [] Hematuria Skin:  [] Rashes   [] Ulcers   [] Wounds Psychological:  [] History of anxiety   []  History of major depression.  Physical Examination  BP 111/70 (BP Location: Right Arm)   Pulse 92   Resp 16   Wt 147 lb 9.6 oz (67 kg)   BMI 27.00 kg/m  Gen:  WD/WN, NAD Head: [place]/AT, No temporalis wasting. Ear/Nose/Throat: Hearing grossly intact, nares w/o erythema or drainage Eyes: Conjunctiva clear. Sclera non-icteric Neck: Supple.  Trachea midline Pulmonary:  Good air movement, no use of accessory muscles.  Cardiac: RRR, no JVD Vascular:  Vessel Right Left  Radial Palpable Palpable              \ Musculoskeletal: M/S 5/5 throughout.  No deformity or atrophy. 1+ LLE edema. Neurologic: Sensation grossly intact in extremities.  Symmetrical.  Speech is fluent.  Psychiatric: Judgment intact, Mood & affect appropriate for pt's clinical situation. Dermatologic: No rashes or ulcers noted.  No cellulitis or open wounds.      Labs Recent Results (from the past 2160 hour(s))  Lipase, blood     Status: None   Collection Time: 02/23/23 12:31 PM  Result Value Ref Range   Lipase 30 11 - 51 U/L    Comment: Performed at [facility], [address] [place], [place] [address]  Comprehensive metabolic panel     Status: Abnormal   Collection Time: 02/23/23 12:31 PM  Result Value Ref Range   Sodium 131 (L) 135 - 145 mmol/L   Potassium 4.2 3.5 - 5.1 mmol/L    Chloride 94 (L) 98 - 111 mmol/L   CO2 28 22 - 32 mmol/L   Glucose, Bld 108 (H) 70 - 99 mg/dL    Comment: Glucose reference range applies only to samples taken after fasting for at least 8 hours.   BUN 11 8 - 23 mg/dL   Creatinine, Ser 6.04 (H) 0.44 - 1.00 mg/dL   Calcium 9.4 8.9 - 54.0 mg/dL   Total Protein 7.8 6.5 - 8.1 g/dL   Albumin 4.2 3.5 - 5.0 g/dL   AST 19 15 - 41 U/L   ALT 12 0 - 44 U/L   Alkaline Phosphatase 63 38 - 126 U/L   Total Bilirubin 0.7 <1.2 mg/dL   GFR, Estimated 48 (L) >60 mL/min    Comment: (NOTE) Calculated using the CKD-EPI Creatinine Equation (2021)    Anion gap 9 5 - 15    Comment: Performed at [facility], [address] [place], [place] [address]  CBC     Status: None   Collection Time: 02/23/23 12:31 PM  Result Value Ref Range   WBC 8.8 4.0 - 10.5 K/uL   RBC 4.02 3.87 - 5.11 MIL/uL   Hemoglobin 12.5 12.0 - 15.0 g/dL   HCT 14.7 82.9 - 56.2 %   MCV 93.0 80.0 - 100.0 fL   MCH 31.1 26.0 - 34.0 pg   MCHC 33.4 30.0 - 36.0 g/dL   RDW 13.0 86.5 - 78.4 %   Platelets 224 150 - 400 K/uL   nRBC 0.0 0.0 - 0.2 %    Comment: Performed at [facility], [address] [place], [place] [address]  Urinalysis, Routine w reflex microscopic -Urine, Clean Catch     Status: Abnormal   Collection Time: 02/23/23 12:31 PM  Result Value Ref Range   Color, Urine STRAW (A) YELLOW   APPearance HAZY (A) CLEAR   Specific  Gravity, Urine 1.003 (L) 1.005 - 1.030   pH 5.0 5.0 - 8.0   Glucose, UA NEGATIVE NEGATIVE mg/dL   Hgb urine dipstick NEGATIVE NEGATIVE   Bilirubin Urine NEGATIVE NEGATIVE   Ketones, ur NEGATIVE NEGATIVE mg/dL   Protein, ur NEGATIVE NEGATIVE mg/dL   Nitrite NEGATIVE NEGATIVE   Leukocytes,Ua TRACE (A) NEGATIVE   RBC / HPF 0-5 0 - 5 RBC/hpf   WBC, UA 0-5 0 - 5 WBC/hpf   Bacteria, UA RARE (A) NONE SEEN   Squamous Epithelial / HPF 0-5 0 - 5 /HPF   Hyaline Casts, UA PRESENT     Comment: Performed at [facility], [address] [place], [place] [address]  CBC with Differential     Status: Abnormal   Collection Time: 03/12/23  2:44 PM  Result Value Ref Range   WBC 15.0 (H) 4.0 - 10.5 K/uL   RBC 4.04 3.87 - 5.11 MIL/uL   Hemoglobin 12.6 12.0 - 15.0 g/dL   HCT 60.4 54.0 - 98.1 %   MCV 97.8 80.0 - 100.0 fL   MCH 31.2 26.0 - 34.0 pg   MCHC 31.9 30.0 - 36.0 g/dL   RDW 19.1 47.8 - 29.5 %   Platelets 205 150 - 400 K/uL   nRBC 0.0 0.0 - 0.2 %   Neutrophils Relative % 88 %   Neutro Abs 13.2 (H) 1.7 - 7.7 K/uL   Lymphocytes Relative 6 %   Lymphs Abs 0.9 0.7 - 4.0 K/uL   Monocytes Relative 5 %   Monocytes Absolute 0.8 0.1 - 1.0 K/uL   Eosinophils Relative 0 %   Eosinophils Absolute 0.0 0.0 - 0.5 K/uL   Basophils Relative 0 %   Basophils Absolute 0.1 0.0 - 0.1 K/uL   Immature Granulocytes 1 %   Abs Immature Granulocytes 0.10 (H) 0.00 - 0.07 K/uL    Comment: Performed at [facility], [address] [place], [place] [address]  Comprehensive metabolic panel     Status: Abnormal   Collection Time: 03/12/23  2:44 PM  Result Value Ref Range   Sodium 132 (L) 135 - 145 mmol/L   Potassium 4.7 3.5 - 5.1 mmol/L   Chloride 100 98 - 111 mmol/L   CO2 17 (L) 22 - 32 mmol/L   Glucose, Bld 138 (H) 70 - 99 mg/dL    Comment: Glucose reference range applies only to samples taken after fasting for at least 8 hours.   BUN 47 (H) 8 - 23 mg/dL   Creatinine, Ser 8.65 (H) 0.44 - 1.00 mg/dL   Calcium 9.3 8.9 - 78.4 mg/dL   Total Protein 8.1 6.5 - 8.1 g/dL   Albumin 4.3 3.5 - 5.0 g/dL   AST 20 15 - 41 U/L   ALT 13 0 - 44 U/L   Alkaline Phosphatase 71 38 - 126 U/L   Total Bilirubin 0.9 <1.2 mg/dL   GFR, Estimated 18 (L) >60 mL/min    Comment: (NOTE) Calculated using the CKD-EPI Creatinine Equation (2021)    Anion gap 15 5 - 15    Comment: Performed at [facility], [address] [place], [place] [address]  Lactic acid, plasma     Status: Abnormal   Collection Time: 03/12/23  2:54 PM  Result  Value Ref Range   Lactic Acid, Venous 3.3 (HH) 0.5 - 1.9 mmol/L    Comment: CRITICAL RESULT CALLED TO, READ BACK BY AND VERIFIED WITH KIM EVANS @1522  03/12/23 MJU Performed at [facility], [address] [place], [place] [address]   Protime-INR     Status: Abnormal   Collection Time: 03/12/23  2:55 PM  Result Value Ref Range   Prothrombin Time 15.5 (H) 11.4 - 15.2 seconds   INR 1.2 0.8 - 1.2    Comment: (NOTE) INR goal varies based on device and disease states. Performed at [facility], [address] [place], [place] [address]   APTT     Status: Abnormal   Collection Time: 03/12/23  2:57 PM  Result Value Ref Range   aPTT 23 (L) 24 - 36 seconds    Comment: Performed at [facility], [address] [place], [place] [address]  Brain natriuretic peptide     Status: None   Collection Time: 03/12/23  3:47 PM  Result Value Ref Range   B Natriuretic Peptide 14.3 0.0 - 100.0 pg/mL    Comment: Performed at [facility], [address] [place], [place] [address]  Lactic acid, plasma     Status: None   Collection Time: 03/12/23  3:48 PM  Result Value Ref Range   Lactic Acid, Venous 1.2 0.5 - 1.9 mmol/L    Comment: Performed at [facility], [address] [place], [place] [address]  Troponin I (High Sensitivity)     Status: None   Collection Time: 03/12/23  3:48 PM  Result Value Ref Range   Troponin I (High Sensitivity) 5 <18 ng/L    Comment: (NOTE) Elevated high sensitivity troponin I (hsTnI) values and significant  changes across serial measurements may suggest ACS but many other  chronic and acute conditions are known to elevate hsTnI results.  Refer to the "Links" section for chest pain algorithms and additional  guidance. Performed at [facility], [address] [place], [place] [address]   Troponin I (High Sensitivity)     Status: None   Collection Time: 03/12/23  6:41 PM  Result Value Ref Range   Troponin  I (High Sensitivity) 5 <18 ng/L    Comment: (NOTE) Elevated high sensitivity troponin I (hsTnI) values and significant  changes across serial measurements may suggest ACS but many other  chronic and acute conditions are known to elevate hsTnI results.  Refer to the "Links" section for chest pain algorithms and additional  guidance. Performed at [facility], [address] [place], [place] [address]   Glucose, capillary     Status: Abnormal   Collection Time: 03/12/23  9:21 PM  Result Value Ref Range   Glucose-Capillary 115 (H) 70 - 99 mg/dL    Comment: Glucose reference range applies only to samples taken after fasting for at least 8 hours.  MRSA Next Gen by PCR, Nasal     Status: None   Collection Time: 03/12/23  9:34 PM   Specimen: Nasal Mucosa; Nasal Swab  Result Value Ref Range   MRSA by PCR Next Gen NOT DETECTED NOT DETECTED    Comment: (NOTE) The GeneXpert MRSA Assay (FDA approved for NASAL specimens only), is one component of a comprehensive MRSA colonization surveillance program. It is not intended to diagnose MRSA infection nor to guide or monitor treatment for MRSA infections. Test performance is not FDA approved in patients less than 65 years old. Performed at [facility], [address] [place], [place] [address]   Lactic acid, plasma     Status: Abnormal   Collection Time: 03/12/23 10:13 PM  Result Value Ref Range   Lactic Acid, Venous  2.2 (HH) 0.5 - 1.9 mmol/L    Comment: CRITICAL RESULT CALLED TO, READ BACK BY AND VERIFIED WITH Noel Christmas RN ICU @ [phone] 03/12/23 LFD Performed at [facility], [address] [place], [place] [address]   Heparin level (unfractionated)     Status: Abnormal   Collection Time: 03/12/23 11:52 PM  Result Value Ref Range   Heparin Unfractionated 0.74 (H) 0.30 - 0.70 IU/mL    Comment: (NOTE) The clinical reportable range upper limit is being lowered to >1.10 to align with the FDA approved guidance  for the current laboratory assay.  If heparin results are below expected values, and patient dosage has  been confirmed, suggest follow up testing of antithrombin III levels. Performed at [facility], [address] [place], [place] [address]   Comprehensive metabolic panel     Status: Abnormal   Collection Time: 03/13/23  3:24 AM  Result Value Ref Range   Sodium 135 135 - 145 mmol/L   Potassium 3.3 (L) 3.5 - 5.1 mmol/L   Chloride 106 98 - 111 mmol/L   CO2 21 (L) 22 - 32 mmol/L   Glucose, Bld 97 70 - 99 mg/dL    Comment: Glucose reference range applies only to samples taken after fasting for at least 8 hours.   BUN 43 (H) 8 - 23 mg/dL   Creatinine, Ser 1.91 (H) 0.44 - 1.00 mg/dL    Comment: REPEATED TO VERIFY LFD   Calcium 8.4 (L) 8.9 - 10.3 mg/dL   Total Protein 6.7 6.5 - 8.1 g/dL   Albumin 3.5 3.5 - 5.0 g/dL   AST 12 (L) 15 - 41 U/L   ALT 11 0 - 44 U/L   Alkaline Phosphatase 61 38 - 126 U/L   Total Bilirubin 0.5 <1.2 mg/dL   GFR, Estimated 29 (L) >60 mL/min    Comment: (NOTE) Calculated using the CKD-EPI Creatinine Equation (2021)    Anion gap 8 5 - 15    Comment: Performed at [facility], [address] [place], [place] [address]  CBC     Status: Abnormal   Collection Time: 03/13/23  3:24 AM  Result Value Ref Range   WBC 11.1 (H) 4.0 - 10.5 K/uL   RBC 3.38 (L) 3.87 - 5.11 MIL/uL   Hemoglobin 10.6 (L) 12.0 - 15.0 g/dL   HCT 56.2 (L) 13.0 - 86.5 %   MCV 95.0 80.0 - 100.0 fL   MCH 31.4 26.0 - 34.0 pg   MCHC 33.0 30.0 - 36.0 g/dL   RDW 78.4 69.6 - 29.5 %   Platelets 169 150 - 400 K/uL   nRBC 0.0 0.0 - 0.2 %    Comment: Performed at [facility], [address] [place], [place] [address]  Heparin level (unfractionated)     Status: Abnormal   Collection Time: 03/13/23 12:00 PM  Result Value Ref Range   Heparin Unfractionated 0.87 (H) 0.30 - 0.70 IU/mL    Comment: (NOTE) The clinical reportable range upper limit is being lowered to  >1.10 to align with the FDA approved guidance for the current laboratory assay.  If heparin results are below expected values, and patient dosage has  been confirmed, suggest follow up testing of antithrombin III levels. Performed at [facility], [address] [place], [place] [address]   Heparin level (unfractionated)     Status: None   Collection Time: 03/13/23  8:17 PM  Result Value Ref Range   Heparin Unfractionated 0.50  0.30 - 0.70 IU/mL    Comment: (NOTE) The clinical reportable range upper limit is being lowered to >1.10 to align with the FDA approved guidance for the current laboratory assay.  If heparin results are below expected values, and patient dosage has  been confirmed, suggest follow up testing of antithrombin III levels. Performed at [facility], [address] [place], [place] [address]   CBC     Status: Abnormal   Collection Time: 03/14/23  4:59 AM  Result Value Ref Range   WBC 9.4 4.0 - 10.5 K/uL   RBC 2.98 (L) 3.87 - 5.11 MIL/uL   Hemoglobin 9.1 (L) 12.0 - 15.0 g/dL   HCT 63.8 (L) 75.6 - 43.3 %   MCV 91.9 80.0 - 100.0 fL   MCH 30.5 26.0 - 34.0 pg   MCHC 33.2 30.0 - 36.0 g/dL   RDW 29.5 18.8 - 41.6 %   Platelets 170 150 - 400 K/uL   nRBC 0.0 0.0 - 0.2 %    Comment: Performed at [facility], [address] [place], [place] [address]  Basic metabolic panel     Status: Abnormal   Collection Time: 03/14/23  4:59 AM  Result Value Ref Range   Sodium 136 135 - 145 mmol/L   Potassium 3.4 (L) 3.5 - 5.1 mmol/L   Chloride 106 98 - 111 mmol/L   CO2 22 22 - 32 mmol/L   Glucose, Bld 108 (H) 70 - 99 mg/dL    Comment: Glucose reference range applies only to samples taken after fasting for at least 8 hours.   BUN 34 (H) 8 - 23 mg/dL   Creatinine, Ser 1.60 (H) 0.44 - 1.00 mg/dL   Calcium 8.6 (L) 8.9 - 10.3 mg/dL   GFR, Estimated 49 (L) >60 mL/min    Comment: (NOTE) Calculated using the CKD-EPI Creatinine Equation (2021)     Anion gap 8 5 - 15    Comment: Performed at [facility], [address] [place], [place] [address]  Heparin level (unfractionated)     Status: Abnormal   Collection Time: 03/14/23  4:59 AM  Result Value Ref Range   Heparin Unfractionated <0.10 (L) 0.30 - 0.70 IU/mL    Comment: REPEATED TO VERIFY (NOTE) The clinical reportable range upper limit is being lowered to >1.10 to align with the FDA approved guidance for the current laboratory assay.  If heparin results are below expected values, and patient dosage has  been confirmed, suggest follow up testing of antithrombin III levels. Performed at [facility], [address] [place], [place] [address]   Heparin level (unfractionated)     Status: None   Collection Time: 03/14/23 10:53 AM  Result Value Ref Range   Heparin Unfractionated 0.33 0.30 - 0.70 IU/mL    Comment: (NOTE) The clinical reportable range upper limit is being lowered to >1.10 to align with the FDA approved guidance for the current laboratory assay.  If heparin results are below expected values, and patient dosage has  been confirmed, suggest follow up testing of antithrombin III levels. Performed at [facility], [address] [place], [place] [address]   ECHOCARDIOGRAM COMPLETE BUBBLE STUDY     Status: None   Collection Time: 03/14/23  2:50 PM  Result Value Ref Range   Ao pk vel 1.22 m/s   AV Area VTI 2.82 cm2   AR max vel 2.78 cm2   AV Mean grad 3.0 mmHg   AV Peak grad 6.0  mmHg   S' Lateral 3.40 cm   AV Area mean vel 2.72 cm2   Area-P 1/2 3.17 cm2   MV VTI 2.19 cm2   Est EF 50 - 55%   CBC     Status: Abnormal   Collection Time: 03/15/23  1:54 AM  Result Value Ref Range   WBC 8.1 4.0 - 10.5 K/uL   RBC 2.04 (L) 3.87 - 5.11 MIL/uL   Hemoglobin 6.4 (L) 12.0 - 15.0 g/dL    Comment: REPEATED TO VERIFY   HCT 19.5 (L) 36.0 - 46.0 %   MCV 95.6 80.0 - 100.0 fL   MCH 31.4 26.0 - 34.0 pg   MCHC 32.8 30.0 - 36.0 g/dL   RDW  16.1 09.6 - 04.5 %   Platelets 144 (L) 150 - 400 K/uL   nRBC 0.0 0.0 - 0.2 %    Comment: Performed at [facility], [address] [place], [place] [address]  Basic metabolic panel     Status: Abnormal   Collection Time: 03/15/23  1:54 AM  Result Value Ref Range   Sodium 135 135 - 145 mmol/L   Potassium 3.2 (L) 3.5 - 5.1 mmol/L   Chloride 108 98 - 111 mmol/L   CO2 23 22 - 32 mmol/L   Glucose, Bld 122 (H) 70 - 99 mg/dL    Comment: Glucose reference range applies only to samples taken after fasting for at least 8 hours.   BUN 26 (H) 8 - 23 mg/dL   Creatinine, Ser 1.91 (H) 0.44 - 1.00 mg/dL   Calcium 7.7 (L) 8.9 - 10.3 mg/dL   GFR, Estimated 56 (L) >60 mL/min    Comment: (NOTE) Calculated using the CKD-EPI Creatinine Equation (2021)    Anion gap 4 (L) 5 - 15    Comment: Performed at [facility], [address] [place], [place] [address]  Heparin level (unfractionated)     Status: None   Collection Time: 03/15/23  1:54 AM  Result Value Ref Range   Heparin Unfractionated 0.33 0.30 - 0.70 IU/mL    Comment: (NOTE) The clinical reportable range upper limit is being lowered to >1.10 to align with the FDA approved guidance for the current laboratory assay.  If heparin results are below expected values, and patient dosage has  been confirmed, suggest follow up testing of antithrombin III levels. Performed at [facility], [address] [place], [place] [address]   Prepare RBC (crossmatch)     Status: None   Collection Time: 03/15/23  3:11 AM  Result Value Ref Range   Order Confirmation      ORDER PROCESSED BY BLOOD BANK Performed at [facility], [address] [place], [place] [address]   Type and screen [facility]     Status: None   Collection Time: 03/15/23  3:26 AM  Result Value Ref Range   ABO/RH(D) A POS    Antibody Screen NEG    Sample Expiration 03/18/2023,2359    Unit Number [id number]    Blood  Component Type RED CELLS,LR    Unit division 00    Status of Unit ISSUED,FINAL    Transfusion Status OK TO TRANSFUSE    Crossmatch Result      Compatible Performed at [facility], [address] [place] [address] [place], [place] [address]   BPAM RBC     Status: None   Collection Time: 03/15/23  3:26 AM  Result Value Ref Range   ISSUE DATE /  TIME [id number]    Blood Product Unit Number [id number]    PRODUCT CODE [id number]    Unit Type and Rh 6200    Blood Product Expiration Date [id number]   Hemoglobin and hematocrit, blood     Status: Abnormal   Collection Time: 03/15/23  1:32 PM  Result Value Ref Range   Hemoglobin 8.2 (L) 12.0 - 15.0 g/dL    Comment: REPEATED TO VERIFY   HCT 24.1 (L) 36.0 - 46.0 %    Comment: Performed at [facility], [address] [place], [place] [address]    Radiology ECHOCARDIOGRAM COMPLETE BUBBLE STUDY  Result Date: 03/14/2023    ECHOCARDIOGRAM REPORT   Patient Name:   MEEYA PACCIONE Kunath Date of Exam: 03/14/2023 Medical Rec #:  [id number]           Height:       62.0 in Accession #:    [id number]          Weight:       157.0 lb Date of Birth:  [birth year]            BSA:          1.725 m Patient Age:    72 years            BP:           147/76 mmHg Patient Gender: F                   HR:           82 bpm. Exam Location:  [facility] Procedure: 2D Echo, Cardiac Doppler, Color Doppler and Saline Contrast Bubble            Study Indications:     Pulmonary embolus                  DVT                  hypotension  History:         Patient has no prior history of Echocardiogram examinations.                  Risk Factors:Hypertension. Anxiety.  Sonographer:     Cristela Blue Referring Phys:  [id number] Marlow Baars Coty Larsh Diagnosing Phys: Alwyn Pea MD IMPRESSIONS  1. Left ventricular ejection fraction, by estimation, is 50 to 55%. The left ventricle has low normal function. The left ventricle has no regional wall motion abnormalities. Left ventricular diastolic  parameters are consistent with Grade I diastolic dysfunction (impaired relaxation).  2. Right ventricular systolic function is normal. The right ventricular size is normal.  3. The mitral valve is normal in structure. Trivial mitral valve regurgitation.  4. The aortic valve is normal in structure. Aortic valve regurgitation is not visualized. FINDINGS  Left Ventricle: Left ventricular ejection fraction, by estimation, is 50 to 55%. The left ventricle has low normal function. The left ventricle has no regional wall motion abnormalities. The left ventricular internal cavity size was normal in size. There is no left ventricular hypertrophy. Left ventricular diastolic parameters are consistent with Grade I diastolic dysfunction (impaired relaxation). Right Ventricle: The right ventricular size is normal. No increase in right ventricular wall thickness. Right ventricular systolic function is normal. Left Atrium: Left atrial size was normal in size. Right Atrium: Right atrial size was normal in size. Pericardium: There is no evidence of pericardial effusion. Mitral Valve: The mitral valve is normal in structure. Trivial mitral  valve regurgitation. MV peak gradient, 4.6 mmHg. The mean mitral valve gradient is 1.0 mmHg. Tricuspid Valve: The tricuspid valve is normal in structure. Tricuspid valve regurgitation is trivial. Aortic Valve: The aortic valve is normal in structure. Aortic valve regurgitation is not visualized. Aortic valve mean gradient measures 3.0 mmHg. Aortic valve peak gradient measures 6.0 mmHg. Aortic valve area, by VTI measures 2.82 cm. Pulmonic Valve: The pulmonic valve was normal in structure. Pulmonic valve regurgitation is not visualized. Aorta: The ascending aorta was not well visualized. IAS/Shunts: No atrial level shunt detected by color flow Doppler. Agitated saline contrast was given intravenously to evaluate for intracardiac shunting.  LEFT VENTRICLE PLAX 2D LVIDd:         4.60 cm   Diastology  LVIDs:         3.40 cm   LV e' medial:    7.94 cm/s LV PW:         1.00 cm   LV E/e' medial:  9.7 LV IVS:        0.80 cm   LV e' lateral:   12.60 cm/s LVOT diam:     2.00 cm   LV E/e' lateral: 6.1 LV SV:         72 LV SV Index:   42 LVOT Area:     3.14 cm  RIGHT VENTRICLE RV Basal diam:  3.50 cm RV Mid diam:    2.70 cm RV S prime:     13.60 cm/s TAPSE (M-mode): 2.0 cm LEFT ATRIUM           Index        RIGHT ATRIUM          Index LA diam:      2.70 cm 1.57 cm/m   RA Area:     8.81 cm LA Vol (A2C): 9.8 ml  5.69 ml/m   RA Volume:   13.90 ml 8.06 ml/m LA Vol (A4C): 17.6 ml 10.21 ml/m  AORTIC VALVE AV Area (Vmax):    2.78 cm AV Area (Vmean):   2.72 cm AV Area (VTI):     2.82 cm AV Vmax:           122.00 cm/s AV Vmean:          80.800 cm/s AV VTI:            0.255 m AV Peak Grad:      6.0 mmHg AV Mean Grad:      3.0 mmHg LVOT Vmax:         108.00 cm/s LVOT Vmean:        70.000 cm/s LVOT VTI:          0.229 m LVOT/AV VTI ratio: 0.90  AORTA Ao Root diam: 2.90 cm MITRAL VALVE                TRICUSPID VALVE MV Area (PHT): 3.17 cm     TR Peak grad:   34.1 mmHg MV Area VTI:   2.19 cm     TR Vmax:        292.00 cm/s MV Peak grad:  4.6 mmHg MV Mean grad:  1.0 mmHg     SHUNTS MV Vmax:       1.07 m/s     Systemic VTI:  0.23 m MV Vmean:      53.7 cm/s    Systemic Diam: 2.00 cm MV Decel Time: 239 msec MV E velocity: 77.00 cm/s MV A velocity: 117.00 cm/s MV E/A ratio:  0.66 Alwyn Pea MD Electronically signed by Alwyn Pea MD Signature Date/Time: 03/14/2023/6:41:35 PM    Final    PERIPHERAL VASCULAR CATHETERIZATION  Result Date: 03/14/2023 See surgical note for result.  PERIPHERAL VASCULAR CATHETERIZATION  Result Date: 03/13/2023 See surgical note for result.  CT Angio Chest PE W and/or Wo Contrast  Addendum Date: 03/12/2023   ADDENDUM REPORT: 03/12/2023 20:46 ADDENDUM: These results were called by telephone at the time of interpretation on 03/12/2023 at 6:44 pm to provider Dr. Vicente Males, who  verbally acknowledged these results. Electronically Signed   By: Darliss Cheney M.D.   On: 03/12/2023 20:46   Result Date: 03/12/2023 CLINICAL DATA:  High probability for PE. EXAM: CT ANGIOGRAPHY CHEST WITH CONTRAST TECHNIQUE: Multidetector CT imaging of the chest was performed using the standard protocol during bolus administration of intravenous contrast. Multiplanar CT image reconstructions and MIPs were obtained to evaluate the vascular anatomy. RADIATION DOSE REDUCTION: This exam was performed according to the departmental dose-optimization program which includes automated exposure control, adjustment of the mA and/or kV according to patient size and/or use of iterative reconstruction technique. CONTRAST:  75mL OMNIPAQUE IOHEXOL 350 MG/ML SOLN COMPARISON:  None Available. FINDINGS: Cardiovascular: There are pulmonary emboli in the distal right main pulmonary artery extending into segmental branches of the right middle lobe, right lower lobe and right upper lobe. There also smaller pulmonary emboli within segmental branches of the left upper lobe and left lower lobe. Heart and aorta are normal in size. There is no pericardial effusion. There are atherosclerotic calcifications of the aorta and coronary arteries. Mediastinum/Nodes: No enlarged mediastinal, hilar, or axillary lymph nodes. Thyroid gland, trachea, and esophagus demonstrate no significant findings. There is a small hiatal hernia. Lungs/Pleura: There is a 2 mm ground-glass nodule in the right upper lobe image 6/15. The lungs are otherwise clear. There is no pleural effusion or pneumothorax. Upper Abdomen: No acute abnormality. Musculoskeletal: Cervical spinal fusion hardware is present. L1 vertebroplasty changes are present. Review of the MIP images confirms the above findings. IMPRESSION: 1. Bilateral pulmonary emboli, right greater than left. No evidence for right heart strain. 2. 2 mm ground-glass nodule in the right upper lobe. No follow-up  needed if patient is low-risk.This recommendation follows the consensus statement: Guidelines for Management of Incidental Pulmonary Nodules Detected on CT Images: From the Fleischner Society 2017; Radiology 2017; 284:228-243. 3. Small hiatal hernia. 4. Aortic atherosclerosis. Aortic Atherosclerosis (ICD10-I70.0). Electronically Signed: By: Darliss Cheney M.D. On: 03/12/2023 18:40   US Venous Img Lower Unilateral Left  Result Date: 03/12/2023 CLINICAL DATA:  Left lower extremity swelling and discoloration. Evaluate for DVT. EXAM: LEFT LOWER EXTREMITY VENOUS DOPPLER ULTRASOUND TECHNIQUE: Gray-scale sonography with graded compression, as well as color Doppler and duplex ultrasound were performed to evaluate the lower extremity deep venous systems from the level of the common femoral vein and including the common femoral, femoral, profunda femoral, popliteal and calf veins including the posterior tibial, peroneal and gastrocnemius veins when visible. The superficial great saphenous vein was also interrogated. Spectral Doppler was utilized to evaluate flow at rest and with distal augmentation maneuvers in the common femoral, femoral and popliteal veins. COMPARISON:  None Available. FINDINGS: Contralateral Common Femoral Vein: Respiratory phasicity is normal and symmetric with the symptomatic side. No evidence of thrombus. Normal compressibility. There is hypoechoic occlusive thrombus involving the left common femoral vein (images 2 and 3), extending to involve the saphenofemoral junction as well as the proximal aspect of the left greater saphenous vein (image  6). There is hypoechoic occlusive thrombus involving the imaged portions of the left deep femoral vein (image 8 and 10). There is hypoechoic occlusive thrombus involving the proximal (image 8 and 12), mid (images 13 and 14) and distal (images 16 and 17) aspects of the left femoral vein There is hypoechoic occlusive thrombus involving the left popliteal vein  (images 20 and 21, extending to involve both paired divisions of the left posterior tibial vein (image 29 and 30 as well as both paired divisions of the left peroneal vein (images 31 and 32). IMPRESSION: The examination is positive for extensive occlusive DVT extending from the left common femoral vein through the imaged tibial veins. Electronically Signed   By: Simonne Come M.D.   On: 03/12/2023 15:54    Assessment/Plan  No problem-specific Assessment & Plan notes found for this encounter.    Festus Barren, MD  03/28/2023 11:52 AM    This note was created with Dragon medical transcription system.  Any errors from dictation are purely unintentional

## 2023-03-28 NOTE — Assessment & Plan Note (Signed)
Much improved after pulmonary thrombectomy.  Will need 1 year of full anticoagulation.  I can plan to see her back at that time.

## 2023-03-28 NOTE — Assessment & Plan Note (Signed)
Status post left lower extremity venous thrombectomy with good results.  Swelling is much improved.  Compression sock and elevation would be recommended at this point.  We can discuss removal of her IVC filter in the next 6 to 8 weeks.

## 2023-03-28 NOTE — Assessment & Plan Note (Signed)
Limit contrast and hydrate with any angiographic procedures.

## 2023-04-17 ENCOUNTER — Encounter (INDEPENDENT_AMBULATORY_CARE_PROVIDER_SITE_OTHER): Payer: Self-pay | Admitting: Vascular Surgery

## 2023-05-12 ENCOUNTER — Telehealth (INDEPENDENT_AMBULATORY_CARE_PROVIDER_SITE_OTHER): Payer: Self-pay

## 2023-05-12 NOTE — Telephone Encounter (Signed)
Looks like she was seen by Dr. Wyn Quaker on 03/28/2023.  She stated that the swelling has gotten worse that she saw him or that it just has not gone down?  If it is that it just has not gone down that is not uncommon as it can take weeks for the swelling to resolve even with thrombectomy.

## 2023-05-12 NOTE — Telephone Encounter (Signed)
Patient had  Pulmonary thrombectomy with IVC placement in left leg on 03/13/23. She has had swelling since the procedure that never went down but says its swelling more now. She doesn't have any pain. Patient would like to be seen.   Please advise

## 2023-05-15 NOTE — Telephone Encounter (Signed)
Patient stated that swelling has went down. It only swells when she walks or stand for a long period of time. I advise that she elevate her legs when she sits and to continue to wear her compression house.

## 2023-06-21 ENCOUNTER — Other Ambulatory Visit (INDEPENDENT_AMBULATORY_CARE_PROVIDER_SITE_OTHER): Payer: Self-pay | Admitting: Vascular Surgery

## 2023-06-21 DIAGNOSIS — I82412 Acute embolism and thrombosis of left femoral vein: Secondary | ICD-10-CM

## 2023-06-23 ENCOUNTER — Ambulatory Visit (INDEPENDENT_AMBULATORY_CARE_PROVIDER_SITE_OTHER): Payer: Medicare Other

## 2023-06-23 ENCOUNTER — Encounter (INDEPENDENT_AMBULATORY_CARE_PROVIDER_SITE_OTHER): Payer: Self-pay | Admitting: Vascular Surgery

## 2023-06-23 ENCOUNTER — Ambulatory Visit (INDEPENDENT_AMBULATORY_CARE_PROVIDER_SITE_OTHER): Payer: Medicare Other | Admitting: Vascular Surgery

## 2023-06-23 VITALS — BP 110/71 | HR 94 | Resp 18 | Ht 62.0 in | Wt 146.6 lb

## 2023-06-23 DIAGNOSIS — I82412 Acute embolism and thrombosis of left femoral vein: Secondary | ICD-10-CM

## 2023-06-23 DIAGNOSIS — N1831 Chronic kidney disease, stage 3a: Secondary | ICD-10-CM

## 2023-06-23 DIAGNOSIS — I2609 Other pulmonary embolism with acute cor pulmonale: Secondary | ICD-10-CM | POA: Diagnosis not present

## 2023-06-23 NOTE — Assessment & Plan Note (Signed)
 A follow-up duplex was done today which shows complete resolution of the left lower extremity DVT with no active DVT or superficial thrombophlebitis identified.  Will see her back in about 3 months to discuss her options for long-term anticoagulation.  Compression socks and elevation recommended for her leg swelling and postphlebitic changes.

## 2023-06-23 NOTE — Progress Notes (Signed)
 MRN : [id number]  Carmen [name] is a 73 y.o. ([birth year]) female who presents with chief complaint of  Chief Complaint  Patient presents with   Follow-up    fu DVT  .  History of Present Illness: Patient returns today in follow up of her left leg DVT.  About 3 months ago, she underwent mechanical thrombectomy for extensive left lower extremity DVT.  She has been on anticoagulation since that time and has tolerated this well.  No signs of bleeding.  Currently, does still have swelling that progresses throughout the day and is most severe in the evenings.  She has intermittently worn compression socks but not regularly.  No right leg symptoms.  No chest pain or shortness of breath.  She is on Eliquis 5 mg twice daily and tolerating this well.  A follow-up duplex was done today which shows complete resolution of the left lower extremity DVT with no active DVT or superficial thrombophlebitis identified  Current Outpatient Medications  Medication Sig Dispense Refill   apixaban (ELIQUIS) 5 MG TABS tablet Take 2 tablets (10 mg total) by mouth 2 (two) times daily for 7 days. 28 tablet 0   apixaban (ELIQUIS) 5 MG TABS tablet Take 1 tablet (5 mg total) by mouth 2 (two) times daily. Only start this script after completing eliquis 10 mg BID x 7 days 60 tablet 0   clonazePAM (KLONOPIN) 0.5 MG tablet Take 1 mg by mouth at bedtime. TAKES 1 OR 2     losartan-hydrochlorothiazide (HYZAAR) 100-12.5 MG tablet Take 1 tablet by mouth daily.      metoprolol succinate (TOPROL-XL) 50 MG 24 hr tablet Take 50 mg by mouth daily. Take with or immediately following a meal.     OLANZapine (ZYPREXA) 10 MG tablet Take 10 mg by mouth at bedtime.     potassium chloride (KLOR-CON) 10 MEQ tablet Take 10 mEq by mouth daily.     sertraline (ZOLOFT) 100 MG tablet Take 200 mg by mouth daily.     traZODone (DESYREL) 100 MG tablet Take 100 mg by mouth at bedtime.     venlafaxine XR (EFFEXOR-XR) 75 MG 24 hr capsule Take 75 mg by  mouth daily with breakfast.     verapamil (CALAN-SR) 240 MG CR tablet Take 240 mg by mouth daily.  3   vitamin B-12 (CYANOCOBALAMIN) 100 MCG tablet Take 100 mcg by mouth daily.     No current facility-administered medications for this visit.   Facility-Administered Medications Ordered in Other Visits  Medication Dose Route Frequency Provider Last Rate Last Admin   methacholine (PROVOCHOLINE) 12 mg/45mL (4 mg/mL) inhaler solution 12 mg  3 mL Inhalation Once Vida Rigger, MD       Followed by   methacholine (PROVOCHOLINE) 48 mg/33mL (16 mg/mL) inhaler solution 48 mg  3 mL Inhalation Once Vida Rigger, MD        Past Medical History:  Diagnosis Date   Anxiety    Arthritis    back   Dehydration    Depression    GERD (gastroesophageal reflux disease)    Hypertension    controlled on meds   Hypotension    Pre-diabetes    SIRS (systemic inflammatory response syndrome) (HCC)    Wears partial dentures    upper    Past Surgical History:  Procedure Laterality Date   ABDOMINAL HYSTERECTOMY     CATARACT EXTRACTION W/PHACO Right 03/21/2016   Procedure: CATARACT EXTRACTION PHACO AND INTRAOCULAR LENS PLACEMENT (IOC);  Surgeon:  Sherald Hess, MD;  Location: [facility];  Service: Ophthalmology;  Laterality: Right;  RIGHT   CATARACT EXTRACTION W/PHACO Left 05/09/2016   Procedure: CATARACT EXTRACTION PHACO AND INTRAOCULAR LENS PLACEMENT (IOC)  Left;  Surgeon: Sherald Hess, MD;  Location: [facility];  Service: Ophthalmology;  Laterality: Left;   CERVICAL SPINE SURGERY  2009   discectomy   COLONOSCOPY     COLONOSCOPY WITH PROPOFOL N/A 01/25/2022   Procedure: COLONOSCOPY WITH PROPOFOL;  Surgeon: Regis Bill, MD;  Location: [facility] END[facility];  Service: Endoscopy;  Laterality: N/A;   KYPHOPLASTY N/A 01/18/2018   Procedure: [id number];  Surgeon: Kennedy Bucker, MD;  Location: [facility] OR[facility];  Service: Orthopedics;  Laterality: N/A;   LOWER EXTREMITY  INTERVENTION Left 03/14/2023   Procedure: LOWER EXTREMITY INTERVENTION;  Surgeon: Annice Needy, MD;  Location: [facility];  Service: Cardiovascular;  Laterality: Left;  Lower Extremity Thrombectomy. Schedule after Dr Wyn Quaker [facility] 4 pm or after.   LUMBAR LAMINECTOMY/ DECOMPRESSION WITH MET-RX Left 04/22/2019   Procedure: LEFT LATERAL L4-5 DISCECTOMY WITH METRX;  Surgeon: Lucy Chris, MD;  Location: [facility] ORS;  Service: Neurosurgery;  Laterality: Left;   PULMONARY THROMBECTOMY N/A 03/13/2023   Procedure: PULMONARY THROMBECTOMY;  Surgeon: Annice Needy, MD;  Location: [facility];  Service: Cardiovascular;  Laterality: N/A;     Social History   Tobacco Use   Smoking status: Former    Current packs/day: 0.00    Average packs/day: 1 pack/day for 20.0 years (20.0 ttl pk-yrs)    Types: Cigarettes    Start date: 63    Quit date: 40    Years since quitting: 40.2   Smokeless tobacco: Never   Tobacco comments:    quit 32 years ago  Vaping Use   Vaping status: Never Used  Substance Use Topics   Alcohol use: No   Drug use: No      Family History  Problem Relation Age of Onset   Breast cancer Sister 68     Allergies  Allergen Reactions   Prednisone Other (See Comments)    Severe depression     REVIEW OF SYSTEMS (Negative unless checked)   Constitutional: [] Weight loss  [] Fever  [] Chills Cardiac: [] Chest pain   [] Chest pressure   [] Palpitations   [] Shortness of breath when laying flat   [] Shortness of breath at rest   [x] Shortness of breath with exertion. Vascular:  [] Pain in legs with walking   [] Pain in legs at rest   [] Pain in legs when laying flat   [] Claudication   [] Pain in feet when walking  [] Pain in feet at rest  [] Pain in feet when laying flat   [x] History of DVT   [x] Phlebitis   [x] Swelling in legs   [] Varicose veins   [] Non-healing ulcers Pulmonary:   [] Uses home oxygen   [] Productive cough   [] Hemoptysis   [] Wheeze  [] COPD    [] Asthma Neurologic:  [] Dizziness  [] Blackouts   [] Seizures   [] History of stroke   [] History of TIA  [] Aphasia   [] Temporary blindness   [] Dysphagia   [] Weakness or numbness in arms   [] Weakness or numbness in legs Musculoskeletal:  [x] Arthritis   [] Joint swelling   [] Joint pain   [x] Low back pain Hematologic:  [] Easy bruising  [] Easy bleeding   [] Hypercoagulable state   [] Anemic   Gastrointestinal:  [] Blood in stool   [] Vomiting blood  [x] Gastroesophageal reflux/heartburn   [] Abdominal pain Genitourinary:  [] Chronic kidney disease   []   Difficult urination  [] Frequent urination  [] Burning with urination   [] Hematuria Skin:  [] Rashes   [] Ulcers   [] Wounds Psychological:  [] History of anxiety   []  History of major depression.   Physical Examination  BP 110/71   Pulse 94   Resp 18   Ht 5\' 2"  (1.575 m)   Wt 146 lb 9.6 oz (66.5 kg)   BMI 26.81 kg/m  Gen:  WD/WN, NAD Head: [place]/AT, No temporalis wasting. Ear/Nose/Throat: Hearing grossly intact, nares w/o erythema or drainage Eyes: Conjunctiva clear. Sclera non-icteric Neck: Supple.  Trachea midline Pulmonary:  Good air movement, no use of accessory muscles.  Cardiac: RRR, no JVD Vascular:  Vessel Right Left  Radial Palpable Palpable           Musculoskeletal: M/S 5/5 throughout.  No deformity or atrophy.  Trace left lower extremity edema. Neurologic: Sensation grossly intact in extremities.  Symmetrical.  Speech is fluent.  Psychiatric: Judgment intact, Mood & affect appropriate for pt's clinical situation. Dermatologic: No rashes or ulcers noted.  No cellulitis or open wounds.      Labs No results found for this or any previous visit (from the past 2160 hours).  Radiology No results found.  Assessment/Plan  DVT (deep venous thrombosis) (HCC) A follow-up duplex was done today which shows complete resolution of the left lower extremity DVT with no active DVT or superficial thrombophlebitis identified.  Will see her back in  about 3 months to discuss her options for long-term anticoagulation.  Compression socks and elevation recommended for her leg swelling and postphlebitic changes.  Acute pulmonary embolism (HCC) Relatively small.  Treat with anticoagulation for minimum of 6 months.  Stage 3a chronic kidney disease (HCC) Can worsen lower extremity swelling.    Festus Barren, MD  06/23/2023 10:39 AM    This note was created with Dragon medical transcription system.  Any errors from dictation are purely unintentional

## 2023-06-23 NOTE — Assessment & Plan Note (Signed)
Can worsen lower extremity swelling

## 2023-06-23 NOTE — Assessment & Plan Note (Signed)
 Relatively small.  Treat with anticoagulation for minimum of 6 months.

## 2023-07-08 ENCOUNTER — Emergency Department
Admission: EM | Admit: 2023-07-08 | Discharge: 2023-07-08 | Disposition: A | Discharge status: Home / Self Care | Attending: Emergency Medicine | Admitting: Emergency Medicine

## 2023-07-08 ENCOUNTER — Emergency Department

## 2023-07-08 ENCOUNTER — Other Ambulatory Visit: Payer: Self-pay

## 2023-07-08 DIAGNOSIS — I10 Essential (primary) hypertension: Secondary | ICD-10-CM | POA: Insufficient documentation

## 2023-07-08 DIAGNOSIS — Z7901 Long term (current) use of anticoagulants: Secondary | ICD-10-CM | POA: Diagnosis not present

## 2023-07-08 DIAGNOSIS — M79605 Pain in left leg: Secondary | ICD-10-CM | POA: Diagnosis present

## 2023-07-08 DIAGNOSIS — M25562 Pain in left knee: Secondary | ICD-10-CM | POA: Insufficient documentation

## 2023-07-08 NOTE — ED Provider Notes (Signed)
 [facility] Provider Note    Event Date/Time   First MD Initiated Contact with Patient 07/08/23 1107     (approximate)  History   Chief Complaint: Leg Pain  HPI  Carmen [name] is a 73 y.o. female with a past medical history of anxiety, gastric reflux, hypertension, presents to the emergency department for left leg pain.  According to the patient for the past several days she has been experiencing some discomfort in the left knee.  Patient has a history of a DVT previously had a thrombectomy performed back in November by Dr. Wyn Quaker.  Patient states she had an ultrasound several weeks ago showing no blood clot but over the last few days she has had increased pain and she is concerned that she could be experiencing a new blood clot.  Patient takes Eliquis.  Denies any chest pain or shortness of breath.  No other symptoms.  Physical Exam   Triage Vital Signs: ED Triage Vitals  Encounter Vitals Group     BP 07/08/23 1017 127/69     Systolic BP Percentile --      Diastolic BP Percentile --      Pulse Rate 07/08/23 1017 100     Resp 07/08/23 1017 17     Temp 07/08/23 1017 98 F (36.7 C)     Temp Source 07/08/23 1017 Oral     SpO2 07/08/23 1017 96 %     Weight 07/08/23 1019 146 lb 6.2 oz (66.4 kg)     Height 07/08/23 1019 5\' 2"  (1.575 m)     Head Circumference --      Peak Flow --      Pain Score 07/08/23 1019 0     Pain Loc --      Pain Education --      Exclude from Growth Chart --     Most recent vital signs: Vitals:   07/08/23 1017  BP: 127/69  Pulse: 100  Resp: 17  Temp: 98 F (36.7 C)  SpO2: 96%    General: Awake, no distress.  CV:  Good peripheral perfusion.  Regular rate and rhythm  Resp:  Normal effort.  Equal breath sounds bilaterally.  Abd:  No distention.  Other:  Patient states mild pain to the anterior aspect of the left knee.  No tenderness to palpation no edema noted no discoloration noted.   ED Results / Procedures /  Treatments   RADIOLOGY  Ultrasound negative for deep ETE.   MEDICATIONS ORDERED IN ED: Medications - No data to display   IMPRESSION / MDM / ASSESSMENT AND PLAN / ED COURSE  I reviewed the triage vital signs and the nursing notes.  Patient's presentation is most consistent with acute presentation with potential threat to life or bodily function.  Patient presents to the emergency department for left knee pain with a history of DVT.  Will proceed with an ultrasound to rule out DVT.  The patient's area of discomfort is more in the anterior aspect of the knee, suspect this will likely be more musculoskeletal pain however we will get an ultrasound as a precaution given the patient's history.  Patient agreeable to plan.  Ultrasound negative for DVT.  Given the reassuring ultrasound we will discharge patient with outpatient follow-up.  Patient reassured by our workup today.  FINAL CLINICAL IMPRESSION(S) / ED DIAGNOSES   Left leg pain Left knee pain  Note:  This document was prepared using Dragon voice recognition software and may  include unintentional dictation errors.   Minna Antis, MD 07/08/23 1221

## 2023-07-08 NOTE — ED Triage Notes (Signed)
 Pt c/o left leg swelling and pain x 1 week. Pt states she had surgery for blood clot 02/2023. Had ultrasound 2 weeks ago at vascular dr - showed no blood clots. Pt is concerned today that she may have a blood clot.

## 2023-07-08 NOTE — ED Triage Notes (Signed)
 Arrived from [facility] for swollen, red and painful left leg for a week. History blood clots. No injury  [facility] vitals: 89/62 b/p 118HR 98% O2 97.6oral

## 2023-07-31 ENCOUNTER — Emergency Department
Admission: EM | Admit: 2023-07-31 | Discharge: 2023-07-31 | Disposition: A | Discharge status: Home / Self Care | Attending: Emergency Medicine | Admitting: Emergency Medicine

## 2023-07-31 ENCOUNTER — Emergency Department

## 2023-07-31 ENCOUNTER — Other Ambulatory Visit: Payer: Self-pay

## 2023-07-31 DIAGNOSIS — Z7901 Long term (current) use of anticoagulants: Secondary | ICD-10-CM | POA: Diagnosis not present

## 2023-07-31 DIAGNOSIS — M25562 Pain in left knee: Secondary | ICD-10-CM | POA: Diagnosis present

## 2023-07-31 MED ORDER — OXYCODONE-ACETAMINOPHEN 5-325 MG PO TABS: 1.0000 | ORAL_TABLET | Freq: Four times a day (QID) | ORAL | 0 refills | Status: AC | PRN

## 2023-07-31 NOTE — ED Provider Notes (Signed)
 [facility] Provider Note    Event Date/Time   First MD Initiated Contact with Patient 07/31/23 1058     (approximate)   History   Knee Pain   HPI  Carmen [name] is a 73 y.o. female past medical history significant for history of DVT and PE on Eliquis who presents to the emergency department for left knee pain.  Patient lives procedure done with vascular surgery for significant left leg DVT in November.  Since that time she has had ongoing pain to her left knee with intermittent episodes of swelling.  Pain continued so she came in to the emergency department because she was tired of dealing with it.  Denies any new falls or trauma.  Does state that her knee has been popping more frequently.  Denies any numbness or weakness.  No pain in her back.     Physical Exam   Triage Vital Signs: ED Triage Vitals [07/31/23 1008]  Encounter Vitals Group     BP 118/65     Systolic BP Percentile      Diastolic BP Percentile      Pulse Rate 93     Resp 17     Temp 98.5 F (36.9 C)     Temp Source Oral     SpO2 98 %     Weight 147 lb (66.7 kg)     Height 5\' 2"  (1.575 m)     Head Circumference      Peak Flow      Pain Score 10     Pain Loc      Pain Education      Exclude from Growth Chart     Most recent vital signs: Vitals:   07/31/23 1008  BP: 118/65  Pulse: 93  Resp: 17  Temp: 98.5 F (36.9 C)  SpO2: 98%    Physical Exam Constitutional:      Appearance: She is well-developed.  HENT:     Head: Atraumatic.  Eyes:     Conjunctiva/sclera: Conjunctivae normal.  Cardiovascular:     Rate and Rhythm: Regular rhythm.  Pulmonary:     Effort: No respiratory distress.  Abdominal:     General: There is no distension.  Musculoskeletal:        General: Normal range of motion.     Cervical back: Normal range of motion.     Right lower leg: No edema.     Left lower leg: No edema.     Comments: Full range of motion with flexion extension to the  left knee.  +2 DP and PT pulses that are equal bilaterally.  Good capillary refill.  Skin:    General: Skin is warm.     Capillary Refill: Capillary refill takes less than 2 seconds.  Neurological:     Mental Status: She is alert. Mental status is at baseline.      IMPRESSION / MDM / ASSESSMENT AND PLAN / ED COURSE  I reviewed the triage vital signs and the nursing notes.  Differential diagnosis including DVT, ongoing pain from a chronic DVT, fracture, meniscus injury, ligamentous injury.  Clinical x-rays not consistent with a septic joint   RADIOLOGY I independently reviewed imaging, my interpretation of imaging: Ultrasound DVT with no findings of acute DVT.  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  X-ray read as no acute findings with findings concerning for arthritis.   Labs (all labs ordered are listed, but only abnormal results are displayed) Labs interpreted as -  Labs Reviewed - No data to display  Patient with good peripheral pulses.  No concern for peripheral arterial disease.  Likely having ongoing pain from her chronic DVT.  No acute DVT.  No acute fracture or dislocation and no concern for septic joint.  Patient limited of what she can take for pain control given her Eliquis use and has been compliant with her Eliquis.  Discussed Tylenol, Lidoderm patches and will do a short course of oxycodone.  Discussed close follow-up with her primary care physician and with her vascular surgeon.  Discussed return to the emergency department for any ongoing or worsening symptoms.     PROCEDURES:  Critical Care performed: No  Procedures  Patient's presentation is most consistent with severe exacerbation of chronic illness.   MEDICATIONS ORDERED IN ED: Medications - No data to display  FINAL CLINICAL IMPRESSION(S) / ED DIAGNOSES   Final diagnoses:  Acute pain of left knee     Rx / DC Orders   ED Discharge Orders          Ordered    oxyCODONE-acetaminophen (PERCOCET) 5-325  MG tablet  Every 6 hours PRN        07/31/23 1126             Note:  This document was prepared using Dragon voice recognition software and may include unintentional dictation errors.   Viviano Ground, MD 07/31/23 1133

## 2023-07-31 NOTE — Discharge Instructions (Signed)
 You were seen in the emergency department for left knee pain.  You had an x-ray that showed arthritis.  The ultrasound did not show any new blood clots.  Continue to take your Eliquis as prescribed.  Call and follow-up closely with your primary care physician and with your vascular surgery team.  Return to the emergency department for any worsening symptoms.  You can try an over-the-counter Lidoderm patch and applied to you your left knee and see if this improves your pain.  You can keep on for 12 hours.  Pain control:  Acetaminophen (tylenol) - You can take 2 extra strength tablets (1000 mg) every 6 hours as needed for pain/fever.   You were given a prescription for narcotic pain medications.  Take only if in severe pain.  These are very addictive medications.  These medications can make you constipated.  If you need to take more than 1-2 doses, start a stool softner.  If you become constipated, take 1 capfull of MiraLAX, can repeat untill having regular bowel movements.  Keep this medication out of reach of any children.

## 2023-07-31 NOTE — ED Triage Notes (Addendum)
 Pt states L knee pain, pt states HX of blood clot and surgery on L knee in November, pt states pain since surgery. NAD noted.

## 2023-07-31 NOTE — ED Notes (Signed)
 See triage note  Presents with pain to posterior left knee  States she had a hx of blood clot in past and had it removed in Nov.  Has had cont'd pain since

## 2023-08-31 ENCOUNTER — Other Ambulatory Visit: Payer: Self-pay | Admitting: Internal Medicine

## 2023-08-31 DIAGNOSIS — Z1231 Encounter for screening mammogram for malignant neoplasm of breast: Secondary | ICD-10-CM

## 2023-09-05 ENCOUNTER — Encounter (INDEPENDENT_AMBULATORY_CARE_PROVIDER_SITE_OTHER): Payer: Self-pay

## 2023-09-22 ENCOUNTER — Ambulatory Visit (INDEPENDENT_AMBULATORY_CARE_PROVIDER_SITE_OTHER): Admitting: Vascular Surgery

## 2023-10-03 ENCOUNTER — Encounter (INDEPENDENT_AMBULATORY_CARE_PROVIDER_SITE_OTHER): Payer: Self-pay | Admitting: Vascular Surgery

## 2023-10-03 ENCOUNTER — Ambulatory Visit (INDEPENDENT_AMBULATORY_CARE_PROVIDER_SITE_OTHER): Admitting: Vascular Surgery

## 2023-10-03 VITALS — BP 106/63 | HR 98 | Resp 16 | Ht 62.0 in | Wt 148.0 lb

## 2023-10-03 DIAGNOSIS — I10 Essential (primary) hypertension: Secondary | ICD-10-CM | POA: Diagnosis not present

## 2023-10-03 DIAGNOSIS — I82412 Acute embolism and thrombosis of left femoral vein: Secondary | ICD-10-CM

## 2023-10-03 DIAGNOSIS — I2699 Other pulmonary embolism without acute cor pulmonale: Secondary | ICD-10-CM

## 2023-10-03 MED ORDER — APIXABAN 2.5 MG PO TABS: 2.5000 mg | ORAL_TABLET | Freq: Two times a day (BID) | ORAL | 1 refills | Status: DC

## 2023-10-03 NOTE — Assessment & Plan Note (Signed)
 blood pressure control important in reducing the progression of atherosclerotic disease. On appropriate oral medications.

## 2023-10-03 NOTE — Assessment & Plan Note (Signed)
 Doing very well after pulmonary thrombectomy about 8 months ago.  Has tolerated anticoagulation.  Okay to decrease the dose to a prophylactic dose at this point which we would continue indefinitely.

## 2023-10-03 NOTE — Progress Notes (Signed)
 MRN : [id number]  Carmen [name] is a 74 y.o. (12/20/1950) female who presents with chief complaint of  Chief Complaint  Patient presents with   Follow-up    3 month follow up   .  History of Present Illness: Patient returns today in follow up of her previous DVT and pulmonary embolus.  She is about 7 to 8 months status post pulmonary thrombectomy and left lower extremity venous thrombectomy for thrombotic issues.  She is doing great.  She has tolerated anticoagulation well.  She had complete resolution of her DVT on her duplex study several months ago.  She has no current shortness of breath, chest pain, or limitations from breathing.  She has occasional mild left leg swelling but nothing significant.  Current Outpatient Medications  Medication Sig Dispense Refill   apixaban  (ELIQUIS ) 2.5 MG TABS tablet Take 1 tablet (2.5 mg total) by mouth 2 (two) times daily. 60 tablet 1   clonazePAM  (KLONOPIN ) 0.5 MG tablet Take 1 mg by mouth at bedtime. TAKES 1 OR 2     losartan-hydrochlorothiazide (HYZAAR) 100-12.5 MG tablet Take 1 tablet by mouth daily.      metoprolol  succinate (TOPROL -XL) 50 MG 24 hr tablet Take 50 mg by mouth daily. Take with or immediately following a meal.     OLANZapine  (ZYPREXA ) 10 MG tablet Take 10 mg by mouth at bedtime.     potassium chloride  (KLOR-CON ) 10 MEQ tablet Take 10 mEq by mouth daily.     sertraline  (ZOLOFT ) 100 MG tablet Take 200 mg by mouth daily.     traZODone  (DESYREL ) 100 MG tablet Take 100 mg by mouth at bedtime.     venlafaxine  XR (EFFEXOR -XR) 75 MG 24 hr capsule Take 75 mg by mouth daily with breakfast.     verapamil  (CALAN -SR) 240 MG CR tablet Take 240 mg by mouth daily.  3   vitamin B-12 (CYANOCOBALAMIN) 100 MCG tablet Take 100 mcg by mouth daily.     No current facility-administered medications for this visit.   Facility-Administered Medications Ordered in Other Visits  Medication Dose Route Frequency Provider Last Rate Last Admin    methacholine  (PROVOCHOLINE ) 12 mg/48mL (4 mg/mL) inhaler solution 12 mg  3 mL Inhalation Once Aleskerov, Fuad, MD       Followed by   methacholine  (PROVOCHOLINE ) 48 mg/3mL (16 mg/mL) inhaler solution 48 mg  3 mL Inhalation Once Aleskerov, Fuad, MD        Past Medical History:  Diagnosis Date   Anxiety    Arthritis    back   Dehydration    Depression    GERD (gastroesophageal reflux disease)    Hypertension    controlled on meds   Hypotension    Pre-diabetes    SIRS (systemic inflammatory response syndrome) (HCC)    Wears partial dentures    upper    Past Surgical History:  Procedure Laterality Date   ABDOMINAL HYSTERECTOMY     CATARACT EXTRACTION W/PHACO Right 03/21/2016   Procedure: CATARACT EXTRACTION PHACO AND INTRAOCULAR LENS PLACEMENT (IOC);  Surgeon: Billee Buddle, MD;  Location: [facility];  Service: Ophthalmology;  Laterality: Right;  RIGHT   CATARACT EXTRACTION W/PHACO Left 05/09/2016   Procedure: CATARACT EXTRACTION PHACO AND INTRAOCULAR LENS PLACEMENT (IOC)  Left;  Surgeon: Billee Buddle, MD;  Location: [facility];  Service: Ophthalmology;  Laterality: Left;   CERVICAL SPINE SURGERY  2009   discectomy   COLONOSCOPY     COLONOSCOPY WITH PROPOFOL  N/A  01/25/2022   Procedure: COLONOSCOPY WITH PROPOFOL ;  Surgeon: Shane Darling, MD;  Location: [facility] ENDOSCOPY;  Service: Endoscopy;  Laterality: N/A;   KYPHOPLASTY N/A 01/18/2018   Procedure: [id number];  Surgeon: Molli Angelucci, MD;  Location: [facility] ORS;  Service: Orthopedics;  Laterality: N/A;   LOWER EXTREMITY INTERVENTION Left 03/14/2023   Procedure: LOWER EXTREMITY INTERVENTION;  Surgeon: Celso College, MD;  Location: [facility] INVASIVE CV LAB;  Service: Cardiovascular;  Laterality: Left;  Lower Extremity Thrombectomy. Schedule after Dr Vonna Guardian [facility] 4 pm or after.   LUMBAR LAMINECTOMY/ DECOMPRESSION WITH MET-RX Left 04/22/2019   Procedure: LEFT LATERAL L4-5 DISCECTOMY WITH  METRX;  Surgeon: Berta Brittle, MD;  Location: [facility] ORS;  Service: Neurosurgery;  Laterality: Left;   PULMONARY THROMBECTOMY N/A 03/13/2023   Procedure: PULMONARY THROMBECTOMY;  Surgeon: Celso College, MD;  Location: [facility] INVASIVE CV LAB;  Service: Cardiovascular;  Laterality: N/A;     Social History   Tobacco Use   Smoking status: Former    Current packs/day: 0.00    Average packs/day: 1 pack/day for 20.0 years (20.0 ttl pk-yrs)    Types: Cigarettes    Start date: 72    Quit date: 62    Years since quitting: 40.4   Smokeless tobacco: Never   Tobacco comments:    quit 32 years ago  Vaping Use   Vaping status: Never Used  Substance Use Topics   Alcohol use: No   Drug use: No      Family History  Problem Relation Age of Onset   Breast cancer Sister 41     Allergies  Allergen Reactions   Prednisone Other (See Comments)    Severe depression     REVIEW OF SYSTEMS (Negative unless checked)   Constitutional: [] Weight loss  [] Fever  [] Chills Cardiac: [] Chest pain   [] Chest pressure   [] Palpitations   [] Shortness of breath when laying flat   [] Shortness of breath at rest   [x] Shortness of breath with exertion. Vascular:  [] Pain in legs with walking   [] Pain in legs at rest   [] Pain in legs when laying flat   [] Claudication   [] Pain in feet when walking  [] Pain in feet at rest  [] Pain in feet when laying flat   [x] History of DVT   [x] Phlebitis   [x] Swelling in legs   [] Varicose veins   [] Non-healing ulcers Pulmonary:   [] Uses home oxygen   [] Productive cough   [] Hemoptysis   [] Wheeze  [] COPD   [] Asthma Neurologic:  [] Dizziness  [] Blackouts   [] Seizures   [] History of stroke   [] History of TIA  [] Aphasia   [] Temporary blindness   [] Dysphagia   [] Weakness or numbness in arms   [] Weakness or numbness in legs Musculoskeletal:  [x] Arthritis   [] Joint swelling   [] Joint pain   [x] Low back pain Hematologic:  [] Easy bruising  [] Easy bleeding   [] Hypercoagulable state   [] Anemic    Gastrointestinal:  [] Blood in stool   [] Vomiting blood  [x] Gastroesophageal reflux/heartburn   [] Abdominal pain Genitourinary:  [] Chronic kidney disease   [] Difficult urination  [] Frequent urination  [] Burning with urination   [] Hematuria Skin:  [] Rashes   [] Ulcers   [] Wounds Psychological:  [] History of anxiety   []  History of major depression.  Physical Examination  BP 106/63 (BP Location: Left Arm, Patient Position: Sitting, Cuff Size: Normal)   Pulse 98   Resp 16   Ht 5' 2 (1.575 m)   Wt 148 lb (67.1 kg)   BMI 27.07  kg/m  Gen:  WD/WN, NAD Head: [place]/AT, No temporalis wasting. Ear/Nose/Throat: Hearing grossly intact, nares w/o erythema or drainage Eyes: Conjunctiva clear. Sclera non-icteric Neck: Supple.  Trachea midline Pulmonary:  Good air movement, no use of accessory muscles.  Cardiac: RRR, no JVD Vascular:  Vessel Right Left  Radial Palpable Palpable                          PT Palpable Palpable  DP Palpable Palpable   Gastrointestinal: soft, non-tender/non-distended. No guarding/reflex.  Musculoskeletal: M/S 5/5 throughout.  No deformity or atrophy. No edema. Neurologic: Sensation grossly intact in extremities.  Symmetrical.  Speech is fluent.  Psychiatric: Judgment intact, Mood & affect appropriate for pt's clinical situation. Dermatologic: No rashes or ulcers noted.  No cellulitis or open wounds.      Labs No results found for this or any previous visit (from the past 2160 hours).  Radiology No results found.  Assessment/Plan  PE (pulmonary thromboembolism) (HCC) Doing very well after pulmonary thrombectomy about 8 months ago.  Has tolerated anticoagulation.  Okay to decrease the dose to a prophylactic dose at this point which we would continue indefinitely.  HTN (hypertension) blood pressure control important in reducing the progression of atherosclerotic disease. On appropriate oral medications.   DVT (deep venous thrombosis) (HCC) The patient  has completed full dose anticoagulation following lower extremity venous thrombectomy about 8 months ago.  We discussed options and she would like to decrease the dose to 2.5 mg twice daily which is reasonable.  Follow-up at her scheduled appointment later this year.    Mikki Alexander, MD  10/03/2023 12:16 PM    This note was created with Dragon medical transcription system.  Any errors from dictation are purely unintentional

## 2023-10-03 NOTE — Assessment & Plan Note (Signed)
 The patient has completed full dose anticoagulation following lower extremity venous thrombectomy about 8 months ago.  We discussed options and she would like to decrease the dose to 2.5 mg twice daily which is reasonable.  Follow-up at her scheduled appointment later this year.

## 2023-10-13 ENCOUNTER — Ambulatory Visit
Admission: RE | Admit: 2023-10-13 | Discharge: 2023-10-13 | Disposition: A | Discharge status: Home / Self Care | Source: Ambulatory Visit | Attending: Internal Medicine | Admitting: Internal Medicine

## 2023-10-13 DIAGNOSIS — Z1231 Encounter for screening mammogram for malignant neoplasm of breast: Secondary | ICD-10-CM | POA: Diagnosis present

## 2023-11-26 ENCOUNTER — Other Ambulatory Visit (INDEPENDENT_AMBULATORY_CARE_PROVIDER_SITE_OTHER): Payer: Self-pay | Admitting: Vascular Surgery

## 2024-01-19 ENCOUNTER — Other Ambulatory Visit: Payer: Self-pay

## 2024-01-19 ENCOUNTER — Emergency Department
Admission: EM | Admit: 2024-01-19 | Discharge: 2024-01-19 | Disposition: A | Discharge status: Home / Self Care | Attending: Emergency Medicine | Admitting: Emergency Medicine

## 2024-01-19 ENCOUNTER — Emergency Department

## 2024-01-19 DIAGNOSIS — I10 Essential (primary) hypertension: Secondary | ICD-10-CM | POA: Diagnosis not present

## 2024-01-19 DIAGNOSIS — M25562 Pain in left knee: Secondary | ICD-10-CM | POA: Diagnosis not present

## 2024-01-19 DIAGNOSIS — M7918 Myalgia, other site: Secondary | ICD-10-CM

## 2024-01-19 DIAGNOSIS — M79605 Pain in left leg: Secondary | ICD-10-CM | POA: Insufficient documentation

## 2024-01-19 LAB — CBC
HCT: 38.3 % (ref 36.0–46.0)
Hemoglobin: 12.6 g/dL (ref 12.0–15.0)
MCH: 30.8 pg (ref 26.0–34.0)
MCHC: 32.9 g/dL (ref 30.0–36.0)
MCV: 93.6 fL (ref 80.0–100.0)
Platelets: 250 K/uL (ref 150–400)
RBC: 4.09 MIL/uL (ref 3.87–5.11)
RDW: 12.7 % (ref 11.5–15.5)
WBC: 8.6 K/uL (ref 4.0–10.5)
nRBC: 0 % (ref 0.0–0.2)

## 2024-01-19 LAB — BASIC METABOLIC PANEL WITH GFR
Anion gap: 10 (ref 5–15)
BUN: 17 mg/dL (ref 8–23)
CO2: 28 mmol/L (ref 22–32)
Calcium: 9.3 mg/dL (ref 8.9–10.3)
Chloride: 100 mmol/L (ref 98–111)
Creatinine, Ser: 1.38 mg/dL — ABNORMAL HIGH (ref 0.44–1.00)
GFR, Estimated: 40 mL/min — ABNORMAL LOW (ref >60)
Glucose, Bld: 168 mg/dL — ABNORMAL HIGH (ref 70–99)
Potassium: 3.9 mmol/L (ref 3.5–5.1)
Sodium: 138 mmol/L (ref 135–145)

## 2024-01-19 NOTE — ED Provider Notes (Addendum)
   [facility] Provider Note    Event Date/Time   First MD Initiated Contact with Patient 01/19/24 1502     (approximate)  History   Chief Complaint: Leg Swelling  HPI  Carmen [name] is a 73 y.o. female with a past medical history of anxiety, arthritis, depression, gastric reflux, hypertension, presents to the emergency department for left leg pain.  According to the patient she has been experiencing some discomfort around her left knee over the last week or so.  Patient denies any chest pain or shortness of breath.  No fever.   Physical Exam   Triage Vital Signs: ED Triage Vitals  Encounter Vitals Group     BP 01/19/24 1319 114/73     Girls Systolic BP Percentile --      Girls Diastolic BP Percentile --      Boys Systolic BP Percentile --      Boys Diastolic BP Percentile --      Pulse Rate 01/19/24 1319 (!) 118     Resp 01/19/24 1319 18     Temp 01/19/24 1319 98 F (36.7 C)     Temp src --      SpO2 01/19/24 1319 98 %     Weight --      Height --      Head Circumference --      Peak Flow --      Pain Score 01/19/24 1318 5     Pain Loc --      Pain Education --      Exclude from Growth Chart --     Most recent vital signs: Vitals:   01/19/24 1319  BP: 114/73  Pulse: (!) 118  Resp: 18  Temp: 98 F (36.7 C)  SpO2: 98%    General: Awake, no distress.  CV:  Good peripheral perfusion.  Regular rhythm rate around 100 bpm. Resp:  Normal effort.  Equal breath sounds bilaterally.  Abd:  No distention.  Other:  No edema of the lower extremity.  No calf tenderness.   ED Results / Procedures / Treatments   RADIOLOGY  Ultrasound negative for acute DVT.  Stable chronic nonocclusive thickening.   MEDICATIONS ORDERED IN ED: Medications - No data to display   IMPRESSION / MDM / ASSESSMENT AND PLAN / ED COURSE  I reviewed the triage vital signs and the nursing notes.  Patient's presentation is most consistent with acute presentation  with potential threat to life or bodily function.  Patient presents emergency department for left leg pain.  Overall the patient appears well, no distress, no tenderness to palpation, no swelling on exam.  Patient does describe an area to the medial aspect of her left knee that has been bothering her but nontender currently.  Patient's lab work shows a reassuring CBC, reassuring chemistry.  Ultrasound negative for DVT.  Patient very reassured by today's workup.  Given the negative ultrasound I believe the patient safe for discharge home with outpatient follow-up.  Patient agreeable to plan.  FINAL CLINICAL IMPRESSION(S) / ED DIAGNOSES   Left leg pain Musculoskeletal pain   Note:  This document was prepared using Dragon voice recognition software and may include unintentional dictation errors.   Dorothyann Drivers, MD 01/19/24 1702    Dorothyann Drivers, MD 01/19/24 [phone]

## 2024-01-19 NOTE — ED Triage Notes (Signed)
 Pt to ED for worsening left leg swelling, reports has been swollen for months. Hx blood clots. Ambulatory with cane.

## 2024-01-19 NOTE — ED Notes (Signed)
 When RN took discharge paperwork to room, pt had already left.

## 2024-01-19 NOTE — Discharge Instructions (Addendum)
 Your workup in the emergency department has shown reassuring results.  Please follow-up with your doctor regarding today's ER visit.  Please use Tylenol  as needed for discomfort as written on the box.  Return to the emergency department for any symptom personally concerning to yourself.

## 2024-01-21 ENCOUNTER — Other Ambulatory Visit (INDEPENDENT_AMBULATORY_CARE_PROVIDER_SITE_OTHER): Payer: Self-pay | Admitting: Vascular Surgery

## 2024-02-26 ENCOUNTER — Telehealth (INDEPENDENT_AMBULATORY_CARE_PROVIDER_SITE_OTHER): Payer: Self-pay

## 2024-02-26 NOTE — Telephone Encounter (Signed)
 Patient called at this time stating she was having increased swelling and pain in her legs at this time. She was seen in the ED 01/19/24 and has had no relief since. Patient also stated she had blood clot surgery last November and was concerned at this time, I called back and left a message at this time to get more information in reference to her legs.

## 2024-02-26 NOTE — Telephone Encounter (Signed)
 Call to patient to report that she should follow up with primary for the possibility of arthritis as well as use compression socks during the day. Did no reach her so left her a voicemail, also included that she may come in sooner for a DVT study and that the front desk will be in contact with her to schedule this

## 2024-02-26 NOTE — Telephone Encounter (Signed)
 It was noted in the emergency room notes that her pain was in her knee.  Is that where her pain is still?  If so it actually may be beneficial for her to see her PCP to evaluate for possible arthritic issues.  She is advised to utilize medical grade compression to help with swelling as she has had a history of DVT.  She currently is on Eliquis  and so there is a low likelihood that she has a DVT but we can bring her in sooner with DVT studies.  If these are negative she will need to see her PCP for further workup.  She can see Dr. Marea, BP or myself

## 2024-02-26 NOTE — Telephone Encounter (Signed)
 Patient has 3/10 pain swelling mainly on the left side no redness and no warmth please advise

## 2024-02-29 ENCOUNTER — Other Ambulatory Visit (INDEPENDENT_AMBULATORY_CARE_PROVIDER_SITE_OTHER): Payer: Self-pay | Admitting: Nurse Practitioner

## 2024-02-29 DIAGNOSIS — M79669 Pain in unspecified lower leg: Secondary | ICD-10-CM

## 2024-02-29 DIAGNOSIS — I82512 Chronic embolism and thrombosis of left femoral vein: Secondary | ICD-10-CM

## 2024-03-01 ENCOUNTER — Ambulatory Visit (INDEPENDENT_AMBULATORY_CARE_PROVIDER_SITE_OTHER)

## 2024-03-01 DIAGNOSIS — I82512 Chronic embolism and thrombosis of left femoral vein: Secondary | ICD-10-CM | POA: Diagnosis not present

## 2024-03-01 DIAGNOSIS — M79669 Pain in unspecified lower leg: Secondary | ICD-10-CM

## 2024-03-01 DIAGNOSIS — M7989 Other specified soft tissue disorders: Secondary | ICD-10-CM | POA: Diagnosis not present

## 2024-03-04 ENCOUNTER — Encounter (INDEPENDENT_AMBULATORY_CARE_PROVIDER_SITE_OTHER): Payer: Self-pay | Admitting: Vascular Surgery

## 2024-03-04 ENCOUNTER — Ambulatory Visit (INDEPENDENT_AMBULATORY_CARE_PROVIDER_SITE_OTHER): Admitting: Vascular Surgery

## 2024-03-04 VITALS — BP 141/84 | HR 90 | Resp 17 | Ht 62.0 in | Wt 156.2 lb

## 2024-03-04 DIAGNOSIS — M79669 Pain in unspecified lower leg: Secondary | ICD-10-CM | POA: Diagnosis not present

## 2024-03-04 DIAGNOSIS — M7989 Other specified soft tissue disorders: Secondary | ICD-10-CM | POA: Diagnosis not present

## 2024-03-04 DIAGNOSIS — I10 Essential (primary) hypertension: Secondary | ICD-10-CM

## 2024-03-04 NOTE — Progress Notes (Signed)
 Subjective:    Patient ID: Carmen [name], female    DOB: [birth year], 73 y.o.   MRN: [id number] Chief Complaint  Patient presents with   Follow-up    fu per pt message with DVT    Patient returns today in follow up of her previous DVT and pulmonary embolus.  She is about 11 months status post pulmonary thrombectomy and left lower extremity venous thrombectomy for thrombotic issues.  She is very concerned that she has another DVT to her left lower extremity.  She has tolerated anticoagulation well.  She had complete resolution of her DVT on her duplex study several months ago and again on 03/01/2024.SABRA  She has no current shortness of breath, chest pain, or limitations from breathing.  She has occasional mild left leg swelling but nothing significant.  She seemed to be very overly concerned about the swelling to her left leg.  No other complaints and her vitals are remained stable today.    Review of Systems  Constitutional: Negative.   Cardiovascular:  Positive for leg swelling.  Musculoskeletal:  Positive for myalgias.  All other systems reviewed and are negative.      Objective:   Physical Exam Constitutional:      Appearance: Normal appearance. She is normal weight.  HENT:     Head: Normocephalic.  Eyes:     Pupils: Pupils are equal, round, and reactive to light.  Cardiovascular:     Rate and Rhythm: Normal rate and regular rhythm.     Pulses: Normal pulses.     Heart sounds: Normal heart sounds.  Pulmonary:     Effort: Pulmonary effort is normal.     Breath sounds: Normal breath sounds.  Abdominal:     General: Abdomen is flat. Bowel sounds are normal.     Palpations: Abdomen is soft.  Musculoskeletal:        General: Normal range of motion.  Skin:    General: Skin is warm and dry.     Capillary Refill: Capillary refill takes 2 to 3 seconds.  Neurological:     General: No focal deficit present.     Mental Status: She is alert and oriented to person, place, and  time. Mental status is at baseline.  Psychiatric:        Mood and Affect: Mood normal.        Behavior: Behavior normal.        Thought Content: Thought content normal.        Judgment: Judgment normal.     BP (!) 141/84   Pulse 90   Resp 17   Ht 5' 2 (1.575 m)   Wt 156 lb 3.2 oz (70.9 kg)   BMI 28.57 kg/m   Past Medical History:  Diagnosis Date   Anxiety    Arthritis    back   Dehydration    Depression    GERD (gastroesophageal reflux disease)    Hypertension    controlled on meds   Hypotension    Pre-diabetes    SIRS (systemic inflammatory response syndrome) (HCC)    Wears partial dentures    upper    Social History   Socioeconomic History   Marital status: Married    Spouse name: Evertt   Number of children: 1   Years of education: Not on file   Highest education level: Not on file  Occupational History   Not on file  Tobacco Use   Smoking status: Former    Current packs/day:  0.00    Average packs/day: 1 pack/day for 20.0 years (20.0 ttl pk-yrs)    Types: Cigarettes    Start date: 64    Quit date: 84    Years since quitting: 40.9   Smokeless tobacco: Never   Tobacco comments:    quit 32 years ago  Vaping Use   Vaping status: Never Used  Substance and Sexual Activity   Alcohol use: No   Drug use: No   Sexual activity: Not on file  Other Topics Concern   Not on file  Social History Narrative   Not on file   Social Drivers of Health   Financial Resource Strain: Low Risk  (09/26/2023)   Received from [facility]   Overall Financial Resource Strain (CARDIA)    Difficulty of Paying Living Expenses: Not hard at all  Food Insecurity: No Food Insecurity (09/26/2023)   Received from [facility]   Hunger Vital Sign    Within the past 12 months, you worried that your food would run out before you got the money to buy more.: Never true    Within the past 12 months, the food you bought just didn't last and you  didn't have money to get more.: Never true  Transportation Needs: No Transportation Needs (09/26/2023)   Received from [facility] - Transportation    In the past 12 months, has lack of transportation kept you from medical appointments or from getting medications?: No    Lack of Transportation (Non-Medical): No  Physical Activity: Not on file  Stress: Not on file  Social Connections: Not on file  Intimate Partner Violence: Not At Risk (03/12/2023)   Humiliation, Afraid, Rape, and Kick questionnaire    Fear of Current or Ex-Partner: No    Emotionally Abused: No    Physically Abused: No    Sexually Abused: No    Past Surgical History:  Procedure Laterality Date   ABDOMINAL HYSTERECTOMY     CATARACT EXTRACTION W/PHACO Right 03/21/2016   Procedure: CATARACT EXTRACTION PHACO AND INTRAOCULAR LENS PLACEMENT (IOC);  Surgeon: Donzell Arlyce Budd, MD;  Location: [facility];  Service: Ophthalmology;  Laterality: Right;  RIGHT   CATARACT EXTRACTION W/PHACO Left 05/09/2016   Procedure: CATARACT EXTRACTION PHACO AND INTRAOCULAR LENS PLACEMENT (IOC)  Left;  Surgeon: Donzell Arlyce Budd, MD;  Location: [facility];  Service: Ophthalmology;  Laterality: Left;   CERVICAL SPINE SURGERY  2009   discectomy   COLONOSCOPY     COLONOSCOPY WITH PROPOFOL  N/A 01/25/2022   Procedure: COLONOSCOPY WITH PROPOFOL ;  Surgeon: Maryruth Ole DASEN, MD;  Location: [facility] END[facility];  Service: Endoscopy;  Laterality: N/A;   KYPHOPLASTY N/A 01/18/2018   Procedure: XBEYNEOJDUB-O8;  Surgeon: Kathlynn Sharper, MD;  Location: [facility] ORS;  Service: Orthopedics;  Laterality: N/A;   LOWER EXTREMITY INTERVENTION Left 03/14/2023   Procedure: LOWER EXTREMITY INTERVENTION;  Surgeon: Marea Selinda RAMAN, MD;  Location: [facility] INVAS[facility];  Service: Cardiovascular;  Laterality: Left;  Lower Extremity Thrombectomy. Schedule after Dr Marea [facility] 4 pm or after.   LUMBAR LAMINECTOMY/  DECOMPRESSION WITH MET-RX Left 04/22/2019   Procedure: LEFT LATERAL L4-5 DISCECTOMY WITH METRX;  Surgeon: Bluford Standing, MD;  Location: [facility] ORS;  Service: Neurosurgery;  Laterality: Left;   PULMONARY THROMBECTOMY N/A 03/13/2023   Procedure: PULMONARY THROMBECTOMY;  Surgeon: Marea Selinda RAMAN, MD;  Location: [facility] INV[facility] CV LAB;  Service: Cardiovascular;  Laterality: N/A;    Family History  Problem  Relation Age of Onset   Breast cancer Sister 43    Allergies  Allergen Reactions   Prednisone Other (See Comments)    Severe depression       Latest Ref Rng & Units 01/19/2024    1:20 PM 03/15/2023    1:32 PM 03/15/2023    1:54 AM  CBC  WBC 4.0 - 10.5 K/uL 8.6   8.1   Hemoglobin 12.0 - 15.0 g/dL 87.3  8.2  6.4   Hematocrit 36.0 - 46.0 % 38.3  24.1  19.5   Platelets 150 - 400 K/uL 250   144       CMP     Component Value Date/Time   NA 138 01/19/2024 1320   K 3.9 01/19/2024 1320   CL 100 01/19/2024 1320   CO2 28 01/19/2024 1320   GLUCOSE 168 (H) 01/19/2024 1320   BUN 17 01/19/2024 1320   CREATININE 1.38 (H) 01/19/2024 1320   CALCIUM 9.3 01/19/2024 1320   PROT 6.7 03/13/2023 0324   ALBUMIN 3.5 03/13/2023 0324   AST 12 (L) 03/13/2023 0324   ALT 11 03/13/2023 0324   ALKPHOS 61 03/13/2023 0324   BILITOT 0.5 03/13/2023 0324   GFRNONAA 40 (L) 01/19/2024 1320     No results found.     Assessment & Plan:   1. Pain and swelling of lower leg, unspecified laterality (Primary) Patient presents to clinic today highly anxious and highly concerned that she has a recurrent DVT in her left lower extremity.  She underwent vascular ultrasounds of her venous system on 03/01/2024.  They were negative for any DVTs.  Left lower extremity showed complete resolution of prior DVT.  I had a long discussion with the patient today regarding intermittent swelling to her lower extremity.  I told her this is common when you had a prior DVT.  She endorses that she had spent a lot of time on her feet the  day before and therefore that may have been contributing to some of the swelling.  Patient has a follow-up visit on December 12 with Dr. Selinda Gu to discuss whether or not she should continue taking Eliquis  5 mg twice daily as she is taking now or whether she can stop her anticoagulation.  I encouraged her to keep that visit and think about it over the next month before returning with the decision.  No further studies will be ordered for that visit.  2. Primary hypertension Continue antihypertensive medications as already ordered, these medications have been reviewed and there are no changes at this time.   Current Outpatient Medications on File Prior to Visit  Medication Sig Dispense Refill   apixaban  (ELIQUIS ) 2.5 MG TABS tablet TAKE 1 TABLET BY MOUTH TWICE A DAY 60 tablet 5   clonazePAM  (KLONOPIN ) 0.5 MG tablet Take 1 mg by mouth at bedtime. TAKES 1 OR 2     losartan-hydrochlorothiazide (HYZAAR) 100-12.5 MG tablet Take 1 tablet by mouth daily.      OLANZapine  (ZYPREXA ) 10 MG tablet Take 10 mg by mouth at bedtime.     potassium chloride  (KLOR-CON ) 10 MEQ tablet Take 10 mEq by mouth daily.     sertraline  (ZOLOFT ) 100 MG tablet Take 200 mg by mouth daily.     traZODone  (DESYREL ) 100 MG tablet Take 100 mg by mouth at bedtime.     venlafaxine  XR (EFFEXOR -XR) 75 MG 24 hr capsule Take 75 mg by mouth daily with breakfast.     verapamil  (CALAN -SR) 240 MG CR  tablet Take 240 mg by mouth daily.  3   vitamin B-12 (CYANOCOBALAMIN) 100 MCG tablet Take 100 mcg by mouth daily.     metoprolol  succinate (TOPROL -XL) 50 MG 24 hr tablet Take 50 mg by mouth daily. Take with or immediately following a meal. (Patient not taking: Reported on 03/04/2024)     Current Facility-Administered Medications on File Prior to Visit  Medication Dose Route Frequency Provider Last Rate Last Admin   methacholine  (PROVOCHOLINE ) 12 mg/3mL (4 mg/mL) inhaler solution 12 mg  3 mL Inhalation Once Aleskerov, Fuad, MD       Followed by    methacholine  (PROVOCHOLINE ) 48 mg/3mL (16 mg/mL) inhaler solution 48 mg  3 mL Inhalation Once Aleskerov, Fuad, MD        There are no Patient Instructions on file for this visit. No follow-ups on file.   Gwendlyn JONELLE Shank, NP

## 2024-03-29 ENCOUNTER — Ambulatory Visit (INDEPENDENT_AMBULATORY_CARE_PROVIDER_SITE_OTHER): Payer: Medicare Other | Admitting: Vascular Surgery

## 2024-04-02 ENCOUNTER — Encounter (INDEPENDENT_AMBULATORY_CARE_PROVIDER_SITE_OTHER): Payer: Self-pay | Admitting: Vascular Surgery

## 2024-04-02 ENCOUNTER — Ambulatory Visit (INDEPENDENT_AMBULATORY_CARE_PROVIDER_SITE_OTHER): Admitting: Vascular Surgery

## 2024-04-02 VITALS — BP 139/82 | HR 93 | Resp 18 | Wt 160.4 lb

## 2024-04-02 DIAGNOSIS — I10 Essential (primary) hypertension: Secondary | ICD-10-CM | POA: Diagnosis not present

## 2024-04-02 DIAGNOSIS — I2699 Other pulmonary embolism without acute cor pulmonale: Secondary | ICD-10-CM | POA: Diagnosis not present

## 2024-04-02 DIAGNOSIS — I82412 Acute embolism and thrombosis of left femoral vein: Secondary | ICD-10-CM

## 2024-04-02 DIAGNOSIS — N1831 Chronic kidney disease, stage 3a: Secondary | ICD-10-CM

## 2024-04-02 NOTE — Assessment & Plan Note (Signed)
 blood pressure control important in reducing the progression of atherosclerotic disease. On appropriate oral medications.

## 2024-04-02 NOTE — Progress Notes (Signed)
 MRN : [id number]  Carmen [name] is a 73 y.o. (June 12, 1950) female who presents with chief complaint of  Chief Complaint  Patient presents with   Follow-up    61yr follow up  .  History of Present Illness:   Discussed the use of AI scribe software for clinical note transcription with the patient, who gave verbal consent to proceed.  History of Present Illness Carmen [name] is a 73 year old female with deep vein thrombosis who presents for management of blood thinners and leg swelling.  She has persistent bilateral leg swelling and pain despite prior thrombectomy and clot removal. She manages symptoms with leg elevation and compression stockings. She is on anticoagulation, but the high cost of Eliquis  affects her ability to continue this medication and her decisions about blood thinner management.  She has now been on an appropriate regimen of full dose Eliquis  for 1 year after her DVT and PE thrombectomies.  We are here today to discuss options for ongoing management and care.  She recently had a duplex study which showed no current DVT with resolution of her previous DVT.  Her swelling is better today.    Results   Current Outpatient Medications  Medication Sig Dispense Refill   apixaban  (ELIQUIS ) 2.5 MG TABS tablet TAKE 1 TABLET BY MOUTH TWICE A DAY 60 tablet 5   clonazePAM  (KLONOPIN ) 0.5 MG tablet Take 1 mg by mouth at bedtime. TAKES 1 OR 2     losartan-hydrochlorothiazide (HYZAAR) 100-12.5 MG tablet Take 1 tablet by mouth daily.      OLANZapine  (ZYPREXA ) 10 MG tablet Take 10 mg by mouth at bedtime.     potassium chloride  (KLOR-CON ) 10 MEQ tablet Take 10 mEq by mouth daily.     sertraline  (ZOLOFT ) 100 MG tablet Take 200 mg by mouth daily.     traZODone  (DESYREL ) 100 MG tablet Take 100 mg by mouth at bedtime.     venlafaxine  XR (EFFEXOR -XR) 75 MG 24 hr capsule Take 75 mg by mouth daily with breakfast.     verapamil  (CALAN -SR) 240 MG CR tablet Take 240 mg by mouth  daily.  3   vitamin B-12 (CYANOCOBALAMIN) 100 MCG tablet Take 100 mcg by mouth daily.     metoprolol  succinate (TOPROL -XL) 50 MG 24 hr tablet Take 50 mg by mouth daily. Take with or immediately following a meal. (Patient not taking: Reported on 04/02/2024)     No current facility-administered medications for this visit.   Facility-Administered Medications Ordered in Other Visits  Medication Dose Route Frequency Provider Last Rate Last Admin   methacholine  (PROVOCHOLINE ) 12 mg/3mL (4 mg/mL) inhaler solution 12 mg  3 mL Inhalation Once Aleskerov, Fuad, MD       Followed by   methacholine  (PROVOCHOLINE ) 48 mg/3mL (16 mg/mL) inhaler solution 48 mg  3 mL Inhalation Once Aleskerov, Fuad, MD        Past Medical History:  Diagnosis Date   Anxiety    Arthritis    back   Dehydration    Depression    GERD (gastroesophageal reflux disease)    Hypertension    controlled on meds   Hypotension    Pre-diabetes    SIRS (systemic inflammatory response syndrome) (HCC)    Wears partial dentures    upper    Past Surgical History:  Procedure Laterality Date   ABDOMINAL HYSTERECTOMY     CATARACT EXTRACTION W/PHACO Right 03/21/2016   Procedure: CATARACT EXTRACTION PHACO AND INTRAOCULAR LENS PLACEMENT (IOC);  Surgeon: Donzell Arlyce Budd, MD;  Location: [facility];  Service: Ophthalmology;  Laterality: Right;  RIGHT   CATARACT EXTRACTION W/PHACO Left 05/09/2016   Procedure: CATARACT EXTRACTION PHACO AND INTRAOCULAR LENS PLACEMENT (IOC)  Left;  Surgeon: Donzell Arlyce Budd, MD;  Location: [facility];  Service: Ophthalmology;  Laterality: Left;   CERVICAL SPINE SURGERY  2009   discectomy   COLONOSCOPY     COLONOSCOPY WITH PROPOFOL  N/A 01/25/2022   Procedure: COLONOSCOPY WITH PROPOFOL ;  Surgeon: Maryruth Ole DASEN, MD;  Location: [facility];  Service: Endoscopy;  Laterality: N/A;   KYPHOPLASTY N/A 01/18/2018   Procedure: XBEYNEOJDUB-O8;  Surgeon: Kathlynn Sharper, MD;   Location: [facility] OR[facility];  Service: Orthopedics;  Laterality: N/A;   LOWER EXTREMITY INTERVENTION Left 03/14/2023   Procedure: LOWER EXTREMITY INTERVENTION;  Surgeon: Marea Selinda RAMAN, MD;  Location: [facility];  Service: Cardiovascular;  Laterality: Left;  Lower Extremity Thrombectomy. Schedule after Dr Marea [facility] 4 pm or after.   LUMBAR LAMINECTOMY/ DECOMPRESSION WITH MET-RX Left 04/22/2019   Procedure: LEFT LATERAL L4-5 DISCECTOMY WITH METRX;  Surgeon: Bluford Standing, MD;  Location: [facility] OR[facility];  Service: Neurosurgery;  Laterality: Left;   PULMONARY THROMBECTOMY N/A 03/13/2023   Procedure: PULMONARY THROMBECTOMY;  Surgeon: Marea Selinda RAMAN, MD;  Location: [facility];  Service: Cardiovascular;  Laterality: N/A;     Social History[1]    Family History  Problem Relation Age of Onset   Breast cancer Sister 65     Allergies[2]   REVIEW OF SYSTEMS (Negative unless checked)  Constitutional: [] Weight loss  [] Fever  [] Chills Cardiac: [] Chest pain   [] Chest pressure   [] Palpitations   [] Shortness of breath when laying flat   [] Shortness of breath at rest   [] Shortness of breath with exertion. Vascular:  [] Pain in legs with walking   [] Pain in legs at rest   [] Pain in legs when laying flat   [] Claudication   [] Pain in feet when walking  [] Pain in feet at rest  [] Pain in feet when laying flat   [x] History of DVT   [] Phlebitis   [x] Swelling in legs   [] Varicose veins   [] Non-healing ulcers Pulmonary:   [] Uses home oxygen   [] Productive cough   [] Hemoptysis   [] Wheeze  [] COPD   [] Asthma Neurologic:  [] Dizziness  [] Blackouts   [] Seizures   [] History of stroke   [] History of TIA  [] Aphasia   [] Temporary blindness   [] Dysphagia   [] Weakness or numbness in arms   [] Weakness or numbness in legs Musculoskeletal:  [x] Arthritis   [] Joint swelling   [] Joint pain   [] Low back pain Hematologic:  [] Easy bruising  [] Easy bleeding   [] Hypercoagulable state   [] Anemic   Gastrointestinal:  [] Blood in  stool   [] Vomiting blood  [x] Gastroesophageal reflux/heartburn   [] Abdominal pain Genitourinary:  [] Chronic kidney disease   [] Difficult urination  [] Frequent urination  [] Burning with urination   [] Hematuria Skin:  [] Rashes   [] Ulcers   [] Wounds Psychological:  [x] History of anxiety   []  History of major depression.  Physical Examination  BP 139/82 (BP Location: Left Arm)   Pulse 93   Resp 18   Wt 160 lb 6.4 oz (72.8 kg)   BMI 29.34 kg/m  Gen:  WD/WN, NAD Head: [place]/AT, No temporalis wasting. Ear/Nose/Throat: Hearing grossly intact, nares w/o erythema or drainage Eyes: Conjunctiva clear. Sclera non-icteric Neck: Supple.  Trachea midline Pulmonary:  Good air movement, no use of accessory muscles.  Cardiac: RRR, no JVD  Vascular:  Vessel Right Left  Radial Palpable Palpable                          PT Palpable Palpable  DP Palpable Palpable   Gastrointestinal: soft, non-tender/non-distended. No guarding/reflex.  Musculoskeletal: M/S 5/5 throughout.  No deformity or atrophy. Trace LE edema. Neurologic: Sensation grossly intact in extremities.  Symmetrical.  Speech is fluent.  Psychiatric: Judgment intact, Mood & affect appropriate for pt's clinical situation. Dermatologic: No rashes or ulcers noted.  No cellulitis or open wounds.  Physical Exam     Labs Recent Results (from the past 2160 hours)  CBC     Status: None   Collection Time: 01/19/24  1:20 PM  Result Value Ref Range   WBC 8.6 4.0 - 10.5 K/uL   RBC 4.09 3.87 - 5.11 MIL/uL   Hemoglobin 12.6 12.0 - 15.0 g/dL   HCT 61.6 63.9 - 53.9 %   MCV 93.6 80.0 - 100.0 fL   MCH 30.8 26.0 - 34.0 pg   MCHC 32.9 30.0 - 36.0 g/dL   RDW 87.2 88.4 - 84.4 %   Platelets 250 150 - 400 K/uL   nRBC 0.0 0.0 - 0.2 %    Comment: Performed at [facility], [address] [place], [place] [address]  Basic metabolic panel     Status: Abnormal   Collection Time: 01/19/24  1:20 PM  Result Value Ref Range   Sodium 138 135 -  145 mmol/L   Potassium 3.9 3.5 - 5.1 mmol/L   Chloride 100 98 - 111 mmol/L   CO2 28 22 - 32 mmol/L   Glucose, Bld 168 (H) 70 - 99 mg/dL    Comment: Glucose reference range applies only to samples taken after fasting for at least 8 hours.   BUN 17 8 - 23 mg/dL   Creatinine, Ser 8.61 (H) 0.44 - 1.00 mg/dL   Calcium 9.3 8.9 - 89.6 mg/dL   GFR, Estimated 40 (L) >60 mL/min    Comment: (NOTE) Calculated using the CKD-EPI Creatinine Equation (2021)    Anion gap 10 5 - 15    Comment: Performed at [facility], [address], [place], [place] [address]    Radiology No results found.  Assessment/Plan   Assessment & Plan Chronic deep vein thrombosis of the left lower extremity Persistent swelling and pain post-thrombectomy due to valve damage. Mild at this point. Thrombectomy reduces swelling by 50%. - Continue compression stockings and leg elevation. - Encourage regular physical activity. - Discussed anticoagulation options: stopping blood thinners and switching to aspirin, continuing full-dose Eliquis , or reducing Eliquis  to a prophylactic dose. Risks and benefits discussed. - Monitor for signs of clot recurrence if stopping Eliquis . -She and her husband preferred to stop Eliquis  in part due to cost.  We discussed that is definitely a reasonable option.  She does have about a 4 time increased risk of recurrent thrombotic episodes after previous DVT and PE off anticoagulation.  She will be cognizant of this and contact our office with any signs or symptoms of DVT or pulmonary embolus. - Contact clinic if significant symptom changes occur.   PE (pulmonary thromboembolism) (HCC) Status post pulmonary thrombectomy and doing well.  Patient prefers to come off of Eliquis  at this time due to cost and that is certainly reasonable.  81 mg aspirin daily.  HTN (hypertension) blood pressure control important in reducing the progression of atherosclerotic disease. On appropriate oral  medications.  Selinda Gu, MD  04/02/2024 12:21 PM    This note was created with Dragon medical transcription system.  Any errors from dictation are purely unintentional     [1]  Social History Tobacco Use   Smoking status: Former    Current packs/day: 0.00    Average packs/day: 1 pack/day for 20.0 years (20.0 ttl pk-yrs)    Types: Cigarettes    Start date: 24    Quit date: 46    Years since quitting: 40.9   Smokeless tobacco: Never   Tobacco comments:    quit 32 years ago  Vaping Use   Vaping status: Never Used  Substance Use Topics   Alcohol use: No   Drug use: No  [2]  Allergies Allergen Reactions   Prednisone Other (See Comments)    Severe depression

## 2024-04-02 NOTE — Assessment & Plan Note (Signed)
 Status post pulmonary thrombectomy and doing well.  Patient prefers to come off of Eliquis  at this time due to cost and that is certainly reasonable.  81 mg aspirin daily.

## 2024-04-13 ENCOUNTER — Emergency Department
Admission: EM | Admit: 2024-04-13 | Discharge: 2024-04-13 | Disposition: A | Discharge status: Home / Self Care | Attending: Emergency Medicine | Admitting: Emergency Medicine

## 2024-04-13 ENCOUNTER — Other Ambulatory Visit: Payer: Self-pay

## 2024-04-13 ENCOUNTER — Emergency Department

## 2024-04-13 DIAGNOSIS — M79604 Pain in right leg: Secondary | ICD-10-CM | POA: Insufficient documentation

## 2024-04-13 NOTE — ED Provider Notes (Signed)
" ° °  [facility] Provider Note    Event Date/Time   First MD Initiated Contact with Patient 04/13/24 1313     (approximate)   History   Leg Pain   HPI  Carmen [name] is a 73 y.o. female who presents with complaints of right thigh discomfort which has been ongoing for a week or 2.  She has a history of DVT in the left leg which was surgically removed, she is on Eliquis  and reports compliance.  No shortness of breath, no pleurisy, no chest pain     Physical Exam   Triage Vital Signs: ED Triage Vitals [04/13/24 1224]  Encounter Vitals Group     BP (!) 141/80     Girls Systolic BP Percentile      Girls Diastolic BP Percentile      Boys Systolic BP Percentile      Boys Diastolic BP Percentile      Pulse Rate 86     Resp 18     Temp 98 F (36.7 C)     Temp Source Oral     SpO2 97 %     Weight      Height      Head Circumference      Peak Flow      Pain Score 5     Pain Loc      Pain Education      Exclude from Growth Chart     Most recent vital signs: Vitals:   04/13/24 1224  BP: (!) 141/80  Pulse: 86  Resp: 18  Temp: 98 F (36.7 C)  SpO2: 97%     General: Awake, no distress.  CV:  Good peripheral perfusion.  Resp:  Normal effort.  Abd:  No distention.  Other:  Right leg: No swelling, warm well-perfused distally, reassuring exam   ED Results / Procedures / Treatments   Labs (all labs ordered are listed, but only abnormal results are displayed) Labs Reviewed - No data to display   EKG     RADIOLOGY     PROCEDURES:  Critical Care performed:   Procedures   MEDICATIONS ORDERED IN ED: Medications - No data to display   IMPRESSION / MDM / ASSESSMENT AND PLAN / ED COURSE  I reviewed the triage vital signs and the nursing notes. Patient's presentation is most consistent with acute presentation with potential threat to life or bodily function.  Patient presents with right leg pain as detailed above,  differential includes DVT, muscle injury, strain  Will obtain ultrasound  ----------------------------------------- 3:06 PM on 04/13/2024 -----------------------------------------  Ultrasound negative for DVT, reassuring exam, appropriate for discharge with outpatient follow-up     FINAL CLINICAL IMPRESSION(S) / ED DIAGNOSES   Final diagnoses:  Right leg pain     Rx / DC Orders   ED Discharge Orders     None        Note:  This document was prepared using Dragon voice recognition software and may include unintentional dictation errors.   Arlander Charleston, MD 04/13/24 [phone]  "

## 2024-04-13 NOTE — ED Triage Notes (Signed)
 Pt to ED via POV from home. Pt ambulatory with cane. Pt reports right upper leg/groin pain x2 wks. Hx of DVT. On blood thinner. Denies SOB or CP.
